# Patient Record
Sex: Male | Born: 1965 | Race: White | Hispanic: No | Marital: Married | Smoking: Current some day smoker
Health system: Southern US, Community
[De-identification: ages and names within clinical notes are randomized; demographics above are authoritative.]

## PROBLEM LIST (undated history)

## (undated) DIAGNOSIS — Z9889 Other specified postprocedural states: Secondary | ICD-10-CM

## (undated) DIAGNOSIS — F439 Reaction to severe stress, unspecified: Principal | ICD-10-CM

## (undated) DIAGNOSIS — Z86711 Personal history of pulmonary embolism: Secondary | ICD-10-CM

## (undated) DIAGNOSIS — F329 Major depressive disorder, single episode, unspecified: Secondary | ICD-10-CM

## (undated) DIAGNOSIS — T7840XA Allergy, unspecified, initial encounter: Secondary | ICD-10-CM

## (undated) DIAGNOSIS — T8859XA Other complications of anesthesia, initial encounter: Secondary | ICD-10-CM

## (undated) DIAGNOSIS — F17200 Nicotine dependence, unspecified, uncomplicated: Secondary | ICD-10-CM

## (undated) DIAGNOSIS — M1990 Unspecified osteoarthritis, unspecified site: Secondary | ICD-10-CM

## (undated) DIAGNOSIS — M25872 Other specified joint disorders, left ankle and foot: Secondary | ICD-10-CM

## (undated) DIAGNOSIS — R112 Nausea with vomiting, unspecified: Secondary | ICD-10-CM

## (undated) DIAGNOSIS — T4145XA Adverse effect of unspecified anesthetic, initial encounter: Secondary | ICD-10-CM

## (undated) DIAGNOSIS — K219 Gastro-esophageal reflux disease without esophagitis: Secondary | ICD-10-CM

## (undated) DIAGNOSIS — F32A Depression, unspecified: Secondary | ICD-10-CM

## (undated) HISTORY — DX: Other specified joint disorders, left ankle and foot: M25.872

## (undated) HISTORY — PX: HAND SURGERY: SHX662

## (undated) HISTORY — DX: Nicotine dependence, unspecified, uncomplicated: F17.200

## (undated) HISTORY — PX: ANKLE ARTHROSCOPY: SUR85

## (undated) HISTORY — DX: Major depressive disorder, single episode, unspecified: F32.9

## (undated) HISTORY — DX: Reaction to severe stress, unspecified: F43.9

## (undated) HISTORY — PX: APPENDECTOMY: SHX54

## (undated) HISTORY — DX: Allergy, unspecified, initial encounter: T78.40XA

## (undated) HISTORY — DX: Depression, unspecified: F32.A

## (undated) HISTORY — DX: Personal history of pulmonary embolism: Z86.711

---
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
## 2006-05-13 ENCOUNTER — Ambulatory Visit: Payer: Self-pay | Admitting: Family Medicine

## 2006-06-27 ENCOUNTER — Ambulatory Visit: Payer: Self-pay | Admitting: Family Medicine

## 2006-07-23 ENCOUNTER — Encounter: Admission: RE | Admit: 2006-07-23 | Discharge: 2006-07-23 | Payer: Self-pay | Admitting: Family Medicine

## 2006-07-23 ENCOUNTER — Ambulatory Visit: Payer: Self-pay | Admitting: Family Medicine

## 2006-07-25 ENCOUNTER — Encounter: Admission: RE | Admit: 2006-07-25 | Discharge: 2006-07-25 | Payer: Self-pay | Admitting: Family Medicine

## 2006-07-31 ENCOUNTER — Ambulatory Visit: Payer: Self-pay | Admitting: Critical Care Medicine

## 2006-07-31 ENCOUNTER — Ambulatory Visit: Payer: Self-pay | Admitting: Family Medicine

## 2006-08-11 ENCOUNTER — Ambulatory Visit: Payer: Self-pay | Admitting: Critical Care Medicine

## 2006-08-11 ENCOUNTER — Ambulatory Visit: Admission: RE | Admit: 2006-08-11 | Discharge: 2006-08-11 | Payer: Self-pay | Admitting: Critical Care Medicine

## 2006-09-29 ENCOUNTER — Ambulatory Visit: Payer: Self-pay | Admitting: Family Medicine

## 2006-10-28 ENCOUNTER — Ambulatory Visit: Payer: Self-pay | Admitting: Critical Care Medicine

## 2007-05-19 ENCOUNTER — Ambulatory Visit: Payer: Self-pay | Admitting: Family Medicine

## 2007-09-04 ENCOUNTER — Ambulatory Visit: Payer: Self-pay | Admitting: Family Medicine

## 2007-12-02 DIAGNOSIS — F17200 Nicotine dependence, unspecified, uncomplicated: Secondary | ICD-10-CM

## 2007-12-02 HISTORY — DX: Nicotine dependence, unspecified, uncomplicated: F17.200

## 2008-01-06 ENCOUNTER — Ambulatory Visit: Payer: Self-pay | Admitting: Family Medicine

## 2008-03-09 ENCOUNTER — Ambulatory Visit: Payer: Self-pay | Admitting: Family Medicine

## 2008-08-01 ENCOUNTER — Ambulatory Visit: Payer: Self-pay | Admitting: Family Medicine

## 2008-09-02 DIAGNOSIS — I82409 Acute embolism and thrombosis of unspecified deep veins of unspecified lower extremity: Secondary | ICD-10-CM

## 2008-09-02 HISTORY — DX: Acute embolism and thrombosis of unspecified deep veins of unspecified lower extremity: I82.409

## 2008-10-26 ENCOUNTER — Ambulatory Visit: Payer: Self-pay | Admitting: Family Medicine

## 2008-12-30 ENCOUNTER — Ambulatory Visit: Payer: Self-pay | Admitting: Family Medicine

## 2009-03-14 ENCOUNTER — Ambulatory Visit: Payer: Self-pay | Admitting: Family Medicine

## 2009-07-21 ENCOUNTER — Ambulatory Visit: Payer: Self-pay | Admitting: Family Medicine

## 2010-06-27 ENCOUNTER — Ambulatory Visit: Payer: Self-pay | Admitting: Family Medicine

## 2010-09-23 ENCOUNTER — Encounter: Payer: Self-pay | Admitting: Family Medicine

## 2011-01-18 NOTE — Assessment & Plan Note (Signed)
[facility]                             PULMONARY OFFICE NOTE   NAME:Dwyer, PROMISE BUSHONG                       MRN:          [id number]  DATE:10/28/2006                            DOB:          [birth year]    Mr. Mirabal returns today in followup, and he has resolution of his  hemoptysis.  He has had no further cough since we saw him in November.  Bronchoscopy was performed, showed only bronchitis with no endobronchial  lesions and no evidence of malignancy.  He has now pursued smoking  cessation utilizing the Chantix product and is now fully off cigarettes  since January 29 of this year.   PHYSICAL EXAMINATION:  Temp 97.6, blood pressure 130/80, pulse 92,  saturation 98% on room air.  CHEST:  Showed to be completely clear without evidence of wheeze, rale  or rhonchi.  CARDIAC:  Exam showed a regular rate and rhythm without S3, normal S1,  S2.  ABDOMEN:  Soft, nontender.  EXTREMITIES:  Showed no edema or clubbing.  SKIN:  Clear.   IMPRESSION:  Stable and resolved acute bronchitis, smoking-related.   PLAN:  For the patient is to maintain off any systemic medications or  inhaled medications.  He has successfully completed smoking cessation.  We will see the patient back in return followup as needed.     Charlcie Cradle Delford Field, MD, [facility]  Electronically Signed    PEW/MedQ  DD: 10/28/2006  DT: 10/29/2006  Job #: [id number]   cc:   Sharlot Gowda, M.D.

## 2011-01-18 NOTE — Op Note (Signed)
NAME:  ISIAH, SCHEEL                ACCOUNT NO.:  [id number]   MEDICAL RECORD NO.:  [id number]          PATIENT TYPE:  AMB   LOCATION:  CARD                         FACILITY:  [facility]   PHYSICIAN:  Charlcie Cradle. Delford Field, MD, FCCPDATE OF BIRTH:  1966/03/09   DATE OF PROCEDURE:  08/11/2006  DATE OF DISCHARGE:                               OPERATIVE REPORT   PROCEDURE:  Bronchoscopy.   INDICATIONS:  Hemoptysis.   OPERATOR:  Jashua Knaak. Delford Field, MD, FCCP   ANESTHESIA:  1% Xylocaine local.   PREOPERATIVE MEDICATIONS:  Versed 4 mg, fentanyl 50 mcg IV push.   PROCEDURE:  The Pentax video bronchoscope was introduced via the right  nares.  The upper airways were visualized and were unremarkable.  The  entire tracheobronchial tree was visualized, and revealed diffuse  tracheobronchitis. However, there was no evidence of endobronchial  lesions.  No specimens were obtained.   COMPLICATIONS:  None.   IMPRESSION:  Tracheobronchitis, induced by smoking and welding fume  exposure.   RECOMMENDATIONS:  Pursue smoking cessation.      Charlcie Cradle Delford Field, MD, [facility]  Electronically Signed     PEW/MEDQ  D:  08/11/2006  T:  08/11/2006  Job:  [id number]

## 2011-01-18 NOTE — Assessment & Plan Note (Signed)
[facility]                             PULMONARY OFFICE NOTE   NAME:Rule, Gerald Fernandez                       MRN:          [id number]  DATE:07/31/2006                            DOB:          [birth year]    Thursday, July 31, 2006   CHIEF COMPLAINT:  Hemoptysis.   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  He has not had hemoptysis before or since.  He has had slight post nasal  drainage, slight amount of chest discomfort on the right anterior chest  wall area.  He denies any previous chronic cough, previous pneumonia.  He does smoke a pack a day of cigarettes for at least 20 years.  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  He is short of breath  with activity, not at rest.  He has no real cough.  Between these  episodes he has some chest discomfort as noted, mild acid heartburn and  indigestion, some headaches, no nasal congestion, no sneezing or itching  or other respiratory complaints.  He is referred for evaluation of  hemoptysis.   PAST MEDICAL HISTORY:  Essentially noncontributory.  No surgeries, no  other major medical problems.   ALLERGIES:  PENICILLIN.   CURRENT MEDICATION:  Multivitamin only.   SOCIAL HISTORY:  Patient is married, has children, smokes as above,  works as a Education officer, environmental.   FAMILY HISTORY:  Emphysema in mother, heart failure in grandmother.   REVIEW OF SYSTEMS:  Otherwise, noncontributory.   PHYSICAL EXAMINATION:  VITAL SIGNS:  Weight 185 pounds, temperature 98,  blood pressure 130/86, pulse 69, saturation 97% on room air.  HEENT:  No jugular venous distension, no lymphadenopathy.  Oropharynx  clear.  NECK:  Supple.  CHEST:  Completely clear to auscultation and percussion.  There was no  evidence of any adventitious breath  sounds, no wheezing, rhonchi or  rales noted.  CARDIOVASCULAR:  Regular rate and rhythm without S3, normal S1 and S2.  ABDOMEN:  Soft, nontender.  EXTREMITIES:  No edema, clubbing or venous disease.  SKIN:  Clear.  NEUROLOGIC:  Intact.   LABORATORY DATA:  CT scan of the chest and chest x-rays were obtained  and reviewed and revealed no active process, no fibrosis, mediastinal  nodes, masses or other abnormalities were seen.  Spirometry was obtained  and showed normal spirometry with an FEV1 of 109% predicted, FVC of 107%  predicted.   IMPRESSION:  Hemoptysis likely due to bronchitis with active smoking use  and welding fume exposure.   RECOMMENDATIONS:  Pursue smoking cessation.  The patient is going to use  over-the-counter Nicoderm for this or nicotine chewing gum.  Patient  will pursue bronchoscopy, this has been scheduled for August 05, 2006.  Once the results of this are available, further recommendations will  follow.     Charlcie Cradle Delford Field, MD, [facility]  Electronically Signed  PEW/MedQ  DD: 07/31/2006  DT: 08/01/2006  Job #: [id number]

## 2011-03-05 ENCOUNTER — Encounter: Payer: Self-pay | Admitting: Medical

## 2011-03-05 ENCOUNTER — Ambulatory Visit (INDEPENDENT_AMBULATORY_CARE_PROVIDER_SITE_OTHER): Payer: Managed Care, Other (non HMO) | Admitting: Medical

## 2011-03-05 VITALS — BP 120/84 | HR 85 | Temp 98.0°F | Ht 70.0 in | Wt 177.0 lb

## 2011-03-05 DIAGNOSIS — J329 Chronic sinusitis, unspecified: Secondary | ICD-10-CM

## 2011-03-05 DIAGNOSIS — F17200 Nicotine dependence, unspecified, uncomplicated: Secondary | ICD-10-CM

## 2011-03-05 DIAGNOSIS — J40 Bronchitis, not specified as acute or chronic: Secondary | ICD-10-CM

## 2011-03-05 MED ORDER — MOXIFLOXACIN HCL 400 MG PO TABS: 400.0000 mg | ORAL_TABLET | Freq: Every day | ORAL | Status: AC

## 2011-03-05 MED ORDER — VARENICLINE TARTRATE 0.5 MG PO TABS: 0.5000 mg | ORAL_TABLET | Freq: Two times a day (BID) | ORAL | Status: AC

## 2011-03-05 NOTE — Patient Instructions (Addendum)
Continue Neti Pot, add Mucinex DM OTC, drink plenty of water, begin Avelox.  Consider Zyrtec daily.  Restart Chantix and call 1-800-QUIT-NOW for counseling to help stop smoking.

## 2011-03-05 NOTE — Progress Notes (Signed)
Subjective:     Gerald Fernandez is a 45 y.o. male who presents for c/o 3wk hx/o cold symptoms.  Thought it was just seasonal allergies, but can't seem to shake it.  Blowing nose and getting lime green infection and mucous.  For the first week it was all in sinuses. Then it moved to his chest, but now seems to be going back and forth from sinus to chest.  Ears popping and pressure like fluid in the ears.   Generally doesn't not feel well. Coughing a lot, and he is a smoker.  Using cough and cold medication OTC and Neti pot for symptoms.  Denies sick contacts.  No other aggravating or relieving factors.  No other c/o.  He is also interested in restarting Chantix.  He was able to quit for 1.5 year in the past on Chantix, but after 1 presentation he had to give at work, he had smoke a cigarette to calm his nerves, and he has been back to smoking since.   The following portions of the patient's history were reviewed and updated as appropriate: allergies, current medications, past family history, past medical history, past social history, past surgical history and problem list.  Past Medical History  Diagnosis Date  . Allergy   . Tobacco use disorder    Review of Systems Constitutional: denies fever, chills, sweats, anorexia Skin: denies rash HEENT: denies sore throat, itchy watery eyes Cardiovascular: denies chest pain Lungs: +cough, wheezing Abdomen: denies abdominal pain, nausea, vomiting, diarrhea GU: denies dysuria  Objective:   Filed Vitals:   03/05/11 1400  BP: 120/84  Pulse: 85  Temp: 98 F (36.7 C)    General appearance: Alert, WD/WN, no distress                             Skin: warm, no rash                           Head: + mild sinus tenderness,                            Eyes: conjunctiva normal, corneas clear, PERRLA                            Ears: TMs with erythema and retracted, external ear canals normal                          Nose: septum midline, turbinates  swollen, with erythema and clear discharge             Mouth/throat: MMM, tongue normal, mild pharyngeal erythema                           Neck: supple, no adenopathy, no thyromegaly, non tender                          Heart: RRR, normal S1, S2, no murmurs                         Lungs: decreased breath sounds, no wheezes, rales, or rhonchi      Assessment:   Encounter Diagnoses  Name Primary?  . Sinusitis Yes  . Bronchitis   .  Tobacco use disorder      Plan:   Sinusitis and bronchitis - Prescription given for Avelox.  Can use OTC Mucinex DM for congestion.  Tylenol or Ibuprofen OTC for fever and malaise.  Discussed symptomatic relief, nasal saline, and call or return if worse or not improving in 2-3 days.     Tobacco use - restart Chantix, and call 1-800-QUIT-NOW for counseling.  He had quit for 1.5 year on Chantix prior.  Recheck in 60mo.

## 2011-08-29 ENCOUNTER — Encounter: Payer: Self-pay | Admitting: Internal Medicine

## 2011-08-30 ENCOUNTER — Ambulatory Visit: Payer: Managed Care, Other (non HMO) | Admitting: Medical

## 2011-12-04 ENCOUNTER — Ambulatory Visit (INDEPENDENT_AMBULATORY_CARE_PROVIDER_SITE_OTHER): Payer: Managed Care, Other (non HMO) | Admitting: Family Medicine

## 2011-12-04 ENCOUNTER — Encounter: Payer: Self-pay | Admitting: Family Medicine

## 2011-12-04 VITALS — BP 130/80 | HR 78 | Wt 188.0 lb

## 2011-12-04 DIAGNOSIS — M25519 Pain in unspecified shoulder: Secondary | ICD-10-CM

## 2011-12-04 DIAGNOSIS — M25512 Pain in left shoulder: Secondary | ICD-10-CM

## 2011-12-04 NOTE — Progress Notes (Signed)
  Subjective:    Patient ID: Gerald Fernandez, male    DOB: 03-08-1966, 46 y.o.   MRN: [id number]  HPI He complains of a six-month history of left scapular pain. No history of injury to that area. He does complain of radiation of the pain down into his arm with motion of the shoulder as well as a cracking sensation.   Review of Systems     Objective:   Physical Exam Full motion of the shoulder without pain. No tenderness to palpation of the shoulder. Neer's and Hawkins tests were Motion of the left scapula does cause a clicking sensation in the scapular area.       Assessment & Plan:  Left scapular pain, etiology unclear. Left scapular x-rays will be ordered and if negative, referral to physical therapy. He is also to take ibuprofen 800 mg 3 times a day.

## 2011-12-04 NOTE — Patient Instructions (Signed)
Take 4 ibuprofen 3 times per day. If the x-ray is negative, I will refer you to physical therapy.

## 2012-06-25 ENCOUNTER — Telehealth: Payer: Self-pay | Admitting: Medical

## 2012-06-25 NOTE — Telephone Encounter (Signed)
He needs an appointment so we can discuss this

## 2012-06-25 NOTE — Telephone Encounter (Signed)
Called pt cell left message he needs appt to discuss chantex refill

## 2012-09-02 DIAGNOSIS — Z86711 Personal history of pulmonary embolism: Secondary | ICD-10-CM

## 2012-09-02 HISTORY — DX: Personal history of pulmonary embolism: Z86.711

## 2012-12-10 ENCOUNTER — Encounter: Payer: Self-pay | Admitting: Family Medicine

## 2012-12-10 ENCOUNTER — Ambulatory Visit (INDEPENDENT_AMBULATORY_CARE_PROVIDER_SITE_OTHER): Payer: Managed Care, Other (non HMO) | Admitting: Family Medicine

## 2012-12-10 VITALS — BP 120/78 | HR 82 | Wt 182.0 lb

## 2012-12-10 DIAGNOSIS — Z7189 Other specified counseling: Secondary | ICD-10-CM

## 2012-12-10 DIAGNOSIS — F17200 Nicotine dependence, unspecified, uncomplicated: Secondary | ICD-10-CM

## 2012-12-10 DIAGNOSIS — Z719 Counseling, unspecified: Secondary | ICD-10-CM

## 2012-12-10 NOTE — Progress Notes (Signed)
  Subjective:    Patient ID: Gerald Fernandez, male    DOB: 1966/04/02, 47 y.o.   MRN: [id number]  HPI He was originally scheduled to discuss smoking cessation as well as difficulty with his foot. He proceeded to tell me that his aunt who he was quite close to died recently of hypertrophic cardiomyopathy. This has him quite distraught. He had to tell his mother of her death. This has made him think about his life and he is not interested in quitting smoking.   Review of Systems     Objective:   Physical Exam Alert and in no distress and tearful at times when discussing the death.       Assessment & Plan:  Current smoker  Bereavement counseling The majority of the time was spent discussing bereavement and helping him deal with his feelings towards her death. We discussed anger, denial, bargaining, depression and finally acceptance. I have encouraged him to call hospice and get involved in bereavement. I also recommended that he take his mother with him. We then discussed smoking cessation. He is interested in Chantix. Information was given to him concerning this. Recommend that he and his wife return and I would discuss smoking cessation with both of them. Discussed making it a contest between them. He will return here in one week. Over 25 minutes spent discussing all these issues with him.

## 2012-12-10 NOTE — Patient Instructions (Signed)
Call Hospice [phone].  Bereavement counseling. Take your mother. Call [phone] Quit Now

## 2012-12-16 ENCOUNTER — Ambulatory Visit (INDEPENDENT_AMBULATORY_CARE_PROVIDER_SITE_OTHER): Payer: Managed Care, Other (non HMO) | Admitting: Family Medicine

## 2012-12-16 ENCOUNTER — Encounter: Payer: Self-pay | Admitting: Family Medicine

## 2012-12-16 VITALS — BP 124/80 | HR 76 | Wt 182.0 lb

## 2012-12-16 DIAGNOSIS — Z716 Tobacco abuse counseling: Secondary | ICD-10-CM

## 2012-12-16 DIAGNOSIS — F17200 Nicotine dependence, unspecified, uncomplicated: Secondary | ICD-10-CM

## 2012-12-16 DIAGNOSIS — Z7189 Other specified counseling: Secondary | ICD-10-CM

## 2012-12-16 NOTE — Patient Instructions (Signed)
Smoking Cessation Quitting smoking is important to your health and has many advantages. However, it is not always easy to quit since nicotine is a very addictive drug. Often times, people try 3 times or more before being able to quit. This document explains the best ways for you to prepare to quit smoking. Quitting takes hard work and a lot of effort, but you can do it. ADVANTAGES OF QUITTING SMOKING  You will live longer, feel better, and live better.  Your body will feel the impact of quitting smoking almost immediately.  Within 20 minutes, blood pressure decreases. Your pulse returns to its normal level.  After 8 hours, carbon monoxide levels in the blood return to normal. Your oxygen level increases.  After 24 hours, the chance of having a heart attack starts to decrease. Your breath, hair, and body stop smelling like smoke.  After 48 hours, damaged nerve endings begin to recover. Your sense of taste and smell improve.  After 72 hours, the body is virtually free of nicotine. Your bronchial tubes relax and breathing becomes easier.  After 2 to 12 weeks, lungs can hold more air. Exercise becomes easier and circulation improves.  The risk of having a heart attack, stroke, cancer, or lung disease is greatly reduced.  After 1 year, the risk of coronary heart disease is cut in half.  After 5 years, the risk of stroke falls to the same as a nonsmoker.  After 10 years, the risk of lung cancer is cut in half and the risk of other cancers decreases significantly.  After 15 years, the risk of coronary heart disease drops, usually to the level of a nonsmoker.  If you are pregnant, quitting smoking will improve your chances of having a healthy baby.  The people you live with, especially any children, will be healthier.  You will have extra money to spend on things other than cigarettes. QUESTIONS TO THINK ABOUT BEFORE ATTEMPTING TO QUIT You may want to talk about your answers with your  caregiver.  Why do you want to quit?  If you tried to quit in the past, what helped and what did not?  What will be the most difficult situations for you after you quit? How will you plan to handle them?  Who can help you through the tough times? Your family? Friends? A caregiver?  What pleasures do you get from smoking? What ways can you still get pleasure if you quit? Here are some questions to ask your caregiver:  How can you help me to be successful at quitting?  What medicine do you think would be best for me and how should I take it?  What should I do if I need more help?  What is smoking withdrawal like? How can I get information on withdrawal? GET READY  Set a quit date.  Change your environment by getting rid of all cigarettes, ashtrays, matches, and lighters in your home, car, or work. Do not let people smoke in your home.  Review your past attempts to quit. Think about what worked and what did not. GET SUPPORT AND ENCOURAGEMENT You have a better chance of being successful if you have help. You can get support in many ways.  Tell your family, friends, and co-workers that you are going to quit and need their support. Ask them not to smoke around you.  Get individual, group, or telephone counseling and support. Programs are available at local hospitals and health centers. Call your local health department for   information about programs in your area.  Spiritual beliefs and practices may help some smokers quit.  Download a "quit meter" on your computer to keep track of quit statistics, such as how long you have gone without smoking, cigarettes not smoked, and money saved.  Get a self-help book about quitting smoking and staying off of tobacco. LEARN NEW SKILLS AND BEHAVIORS  Distract yourself from urges to smoke. Talk to someone, go for a walk, or occupy your time with a task.  Change your normal routine. Take a different route to work. Drink tea instead of coffee.  Eat breakfast in a different place.  Reduce your stress. Take a hot bath, exercise, or read a book.  Plan something enjoyable to do every day. Reward yourself for not smoking.  Explore interactive web-based programs that specialize in helping you quit. GET MEDICINE AND USE IT CORRECTLY Medicines can help you stop smoking and decrease the urge to smoke. Combining medicine with the above behavioral methods and support can greatly increase your chances of successfully quitting smoking.  Nicotine replacement therapy helps deliver nicotine to your body without the negative effects and risks of smoking. Nicotine replacement therapy includes nicotine gum, lozenges, inhalers, nasal sprays, and skin patches. Some may be available over-the-counter and others require a prescription.  Antidepressant medicine helps people abstain from smoking, but how this works is unknown. This medicine is available by prescription.  Nicotinic receptor partial agonist medicine simulates the effect of nicotine in your brain. This medicine is available by prescription. Ask your caregiver for advice about which medicines to use and how to use them based on your health history. Your caregiver will tell you what side effects to look out for if you choose to be on a medicine or therapy. Carefully read the information on the package. Do not use any other product containing nicotine while using a nicotine replacement product.  RELAPSE OR DIFFICULT SITUATIONS Most relapses occur within the first 3 months after quitting. Do not be discouraged if you start smoking again. Remember, most people try several times before finally quitting. You may have symptoms of withdrawal because your body is used to nicotine. You may crave cigarettes, be irritable, feel very hungry, cough often, get headaches, or have difficulty concentrating. The withdrawal symptoms are only temporary. They are strongest when you first quit, but they will go away within  10 14 days. To reduce the chances of relapse, try to:  Avoid drinking alcohol. Drinking lowers your chances of successfully quitting.  Reduce the amount of caffeine you consume. Once you quit smoking, the amount of caffeine in your body increases and can give you symptoms, such as a rapid heartbeat, sweating, and anxiety.  Avoid smokers because they can make you want to smoke.  Do not let weight gain distract you. Many smokers will gain weight when they quit, usually less than 10 pounds. Eat a healthy diet and stay active. You can always lose the weight gained after you quit.  Find ways to improve your mood other than smoking. FOR MORE INFORMATION  www.smokefree.gov  Document Released: 08/13/2001 Document Revised: 02/18/2012 Document Reviewed: 11/28/2011 [employer] Patient Information 2013 [employer].  

## 2012-12-16 NOTE — Progress Notes (Signed)
  Subjective:    Patient ID: Gerald Fernandez, male    DOB: Sep 06, 1965, 47 y.o.   MRN: [id number]  HPI He is here with his wife to discuss smoking cessation. They have talked about this. They've even talked about having a contest concerning this. Upon further questioning he admits to not being ready.   Review of Systems     Objective:   Physical Exam Alert and in no distress otherwise not examined       Assessment & Plan:  Current smoker  Encounter for smoking cessation counseling  approximately one half hour spent discussing smoking cessation in regard to habit versus true addiction.discussed fully evaluating his smoking habits including when where and why it, making a list of things to do instead of smoking. Discussed: 800 number. We also discussed the use of Chantix. We also reviewed making a contest on this between him and his wife. He is to let me know when he is ready to actually start smoking program.

## 2013-02-10 ENCOUNTER — Ambulatory Visit (INDEPENDENT_AMBULATORY_CARE_PROVIDER_SITE_OTHER): Payer: Managed Care, Other (non HMO) | Admitting: Family Medicine

## 2013-02-10 ENCOUNTER — Encounter: Payer: Self-pay | Admitting: Family Medicine

## 2013-02-10 VITALS — BP 120/80 | HR 86 | Temp 97.5°F | Wt 174.0 lb

## 2013-02-10 DIAGNOSIS — M129 Arthropathy, unspecified: Secondary | ICD-10-CM

## 2013-02-10 DIAGNOSIS — R1032 Left lower quadrant pain: Secondary | ICD-10-CM

## 2013-02-10 DIAGNOSIS — M1990 Unspecified osteoarthritis, unspecified site: Secondary | ICD-10-CM

## 2013-02-10 NOTE — Progress Notes (Signed)
  Subjective:    Patient ID: OCEAN SCHILDT, male    DOB: 17-Jun-1966, 47 y.o.   MRN: [id number]  HPI 2 years ago he injured his left ankle and states that it swelled up and got discolored. He took care of himself. He is still having difficulty with it in that he has to wear a brace at work. It also hurts when he runs . 5 days ago he woke up with abdominal distress and bloating sensation. Later that evening he noted some left-sided abdominal pain with nausea but no trouble with diarrhea. The pain has been slowly getting better but still feeling left lower cautery pain no nausea, vomiting, urinary symptoms   Review of Systems     Objective:   Physical Exam Alert and in no distress. Abdominal exam shows active bowel sounds with some mid quadrant tenderness and questionable rebound. Genital exam normal. Rectal exam shows a normal prostate with guaiac-negative stool. Ankle x-ray does show arthritic changes present.       Assessment & Plan:  Arthritis  Abdominal pain, left lower quadrant recommend using Tylenol and then anti-inflammatory choice for his ankle. He can also use the brace as needed. Recommend he get involved in nonrunning type activities he keep himself in shape specifically cycling or swimming. Also discussed the abdominal pain and since he is getting better, no intervention necessary. Mentioned the possibility of diverticulitis. If he has reoccurrence of this, he is to call me and we will pursue this more aggressively.

## 2013-02-10 NOTE — Patient Instructions (Signed)
Since you're getting better, we will leave you alone but if this cranks up again I want to know about it quickly. Try Tylenol first for your pain and use the brace as needed and I would try physical activities that you don't have to use her ankle. Cycling swimming

## 2013-04-03 ENCOUNTER — Encounter (HOSPITAL_COMMUNITY): Payer: Self-pay | Admitting: *Deleted

## 2013-04-03 ENCOUNTER — Inpatient Hospital Stay (HOSPITAL_COMMUNITY)
Admission: EM | Admit: 2013-04-03 | Discharge: 2013-04-06 | DRG: 176 | Disposition: A | Discharge status: Home / Self Care | Payer: 59 | Attending: Internal Medicine | Admitting: Internal Medicine

## 2013-04-03 ENCOUNTER — Emergency Department (HOSPITAL_COMMUNITY): Payer: 59

## 2013-04-03 DIAGNOSIS — Z86711 Personal history of pulmonary embolism: Secondary | ICD-10-CM | POA: Diagnosis present

## 2013-04-03 DIAGNOSIS — R1011 Right upper quadrant pain: Secondary | ICD-10-CM

## 2013-04-03 DIAGNOSIS — Z720 Tobacco use: Secondary | ICD-10-CM

## 2013-04-03 DIAGNOSIS — D72829 Elevated white blood cell count, unspecified: Secondary | ICD-10-CM | POA: Diagnosis present

## 2013-04-03 DIAGNOSIS — I2699 Other pulmonary embolism without acute cor pulmonale: Principal | ICD-10-CM

## 2013-04-03 DIAGNOSIS — Z880 Allergy status to penicillin: Secondary | ICD-10-CM

## 2013-04-03 DIAGNOSIS — R110 Nausea: Secondary | ICD-10-CM | POA: Diagnosis present

## 2013-04-03 DIAGNOSIS — R0781 Pleurodynia: Secondary | ICD-10-CM | POA: Diagnosis present

## 2013-04-03 DIAGNOSIS — F17200 Nicotine dependence, unspecified, uncomplicated: Secondary | ICD-10-CM | POA: Diagnosis present

## 2013-04-03 LAB — CBC
Hemoglobin: 15.3 g/dL (ref 13.0–17.0)
Platelets: 188 10*3/uL (ref 150–400)
RBC: 4.97 MIL/uL (ref 4.22–5.81)
WBC: 13.9 10*3/uL — ABNORMAL HIGH (ref 4.0–10.5)

## 2013-04-03 LAB — POCT I-STAT TROPONIN I: Troponin i, poc: 0 ng/mL (ref 0.00–0.08)

## 2013-04-03 NOTE — ED Notes (Signed)
Pt states that he was helping in kitchen experiencing right sided CP that radiates to right shoulder and back, SOB. Tender to palpation. Pain exacerbated by breathing, Pain relived by  Nothing.

## 2013-04-04 ENCOUNTER — Emergency Department (HOSPITAL_COMMUNITY): Payer: 59

## 2013-04-04 ENCOUNTER — Encounter (HOSPITAL_COMMUNITY): Payer: Self-pay | Admitting: Radiology

## 2013-04-04 DIAGNOSIS — I2699 Other pulmonary embolism without acute cor pulmonale: Principal | ICD-10-CM

## 2013-04-04 DIAGNOSIS — F17200 Nicotine dependence, unspecified, uncomplicated: Secondary | ICD-10-CM | POA: Diagnosis present

## 2013-04-04 DIAGNOSIS — R0781 Pleurodynia: Secondary | ICD-10-CM | POA: Diagnosis present

## 2013-04-04 DIAGNOSIS — R071 Chest pain on breathing: Secondary | ICD-10-CM

## 2013-04-04 DIAGNOSIS — Z86711 Personal history of pulmonary embolism: Secondary | ICD-10-CM | POA: Diagnosis present

## 2013-04-04 LAB — PRO B NATRIURETIC PEPTIDE: Pro B Natriuretic peptide (BNP): 27.1 pg/mL (ref 0–125)

## 2013-04-04 LAB — BASIC METABOLIC PANEL
CO2: 27 mEq/L (ref 19–32)
Calcium: 9.3 mg/dL (ref 8.4–10.5)
Chloride: 101 mEq/L (ref 96–112)
Glucose, Bld: 110 mg/dL — ABNORMAL HIGH (ref 70–99)
Potassium: 3.8 mEq/L (ref 3.5–5.1)
Sodium: 137 mEq/L (ref 135–145)

## 2013-04-04 LAB — D-DIMER, QUANTITATIVE: D-Dimer, Quant: 1.03 ug/mL-FEU — ABNORMAL HIGH (ref 0.00–0.48)

## 2013-04-04 MED ORDER — OXYCODONE-ACETAMINOPHEN 5-325 MG PO TABS
2.0000 | ORAL_TABLET | ORAL | Status: DC | PRN
  Administered 2013-04-04 – 2013-04-06 (×8): 2 via ORAL
  Administered 2013-04-06: 1 via ORAL
  Filled 2013-04-04 (×3): qty 2
  Filled 2013-04-04: qty 1
  Filled 2013-04-04 (×5): qty 2

## 2013-04-04 MED ORDER — IOHEXOL 350 MG/ML SOLN
100.0000 mL | Freq: Once | INTRAVENOUS | Status: AC | PRN
  Administered 2013-04-04: 80 mL via INTRAVENOUS

## 2013-04-04 MED ORDER — ACETAMINOPHEN 325 MG PO TABS
650.0000 mg | ORAL_TABLET | Freq: Four times a day (QID) | ORAL | Status: DC | PRN
  Administered 2013-04-05: 650 mg via ORAL
  Filled 2013-04-04: qty 2

## 2013-04-04 MED ORDER — RIVAROXABAN 15 MG PO TABS
15.0000 mg | ORAL_TABLET | Freq: Two times a day (BID) | ORAL | Status: DC
  Administered 2013-04-04 – 2013-04-06 (×5): 15 mg via ORAL
  Filled 2013-04-04 (×10): qty 1

## 2013-04-04 MED ORDER — HYDROMORPHONE HCL PF 1 MG/ML IJ SOLN
1.0000 mg | Freq: Once | INTRAMUSCULAR | Status: AC
  Administered 2013-04-04: 1 mg via INTRAVENOUS
  Filled 2013-04-04: qty 1

## 2013-04-04 MED ORDER — ADULT MULTIVITAMIN W/MINERALS CH
1.0000 | ORAL_TABLET | Freq: Every day | ORAL | Status: DC
  Administered 2013-04-04 – 2013-04-06 (×3): 1 via ORAL
  Filled 2013-04-04 (×4): qty 1

## 2013-04-04 MED ORDER — NICOTINE 21 MG/24HR TD PT24
21.0000 mg | MEDICATED_PATCH | Freq: Every day | TRANSDERMAL | Status: DC
  Administered 2013-04-04 – 2013-04-06 (×3): 21 mg via TRANSDERMAL
  Filled 2013-04-04 (×3): qty 1

## 2013-04-04 MED ORDER — ZOLPIDEM TARTRATE 5 MG PO TABS: 5.0000 mg | ORAL_TABLET | Freq: Every evening | ORAL | Status: DC | PRN

## 2013-04-04 MED ORDER — SODIUM CHLORIDE 0.9 % IJ SOLN
3.0000 mL | Freq: Two times a day (BID) | INTRAMUSCULAR | Status: DC
  Administered 2013-04-04 – 2013-04-06 (×5): 3 mL via INTRAVENOUS

## 2013-04-04 MED ORDER — HYDROCODONE-ACETAMINOPHEN 5-325 MG PO TABS
1.0000 | ORAL_TABLET | ORAL | Status: DC | PRN
  Administered 2013-04-04 (×3): 2 via ORAL
  Filled 2013-04-04 (×3): qty 2

## 2013-04-04 MED ORDER — HYDROMORPHONE HCL PF 1 MG/ML IJ SOLN
1.0000 mg | INTRAMUSCULAR | Status: DC | PRN
  Administered 2013-04-04 – 2013-04-05 (×3): 1 mg via INTRAVENOUS
  Filled 2013-04-04 (×4): qty 1

## 2013-04-04 MED ORDER — ONDANSETRON HCL 4 MG/2ML IJ SOLN
4.0000 mg | Freq: Three times a day (TID) | INTRAMUSCULAR | Status: DC | PRN
  Administered 2013-04-04: 4 mg via INTRAVENOUS
  Filled 2013-04-04: qty 2

## 2013-04-04 MED ORDER — PANTOPRAZOLE SODIUM 40 MG PO TBEC
40.0000 mg | DELAYED_RELEASE_TABLET | Freq: Every day | ORAL | Status: DC
  Administered 2013-04-04 – 2013-04-05 (×2): 40 mg via ORAL
  Filled 2013-04-04 (×2): qty 1

## 2013-04-04 MED ORDER — ACETAMINOPHEN 650 MG RE SUPP: 650.0000 mg | Freq: Four times a day (QID) | RECTAL | Status: DC | PRN

## 2013-04-04 MED ORDER — ENOXAPARIN SODIUM 80 MG/0.8ML ~~LOC~~ SOLN
80.0000 mg | Freq: Once | SUBCUTANEOUS | Status: DC
  Filled 2013-04-04: qty 0.8

## 2013-04-04 MED ORDER — HYDROMORPHONE HCL PF 1 MG/ML IJ SOLN
1.0000 mg | INTRAMUSCULAR | Status: DC | PRN
  Administered 2013-04-04 (×3): 1 mg via INTRAVENOUS
  Filled 2013-04-04 (×3): qty 1

## 2013-04-04 MED ORDER — ONDANSETRON HCL 4 MG PO TABS
4.0000 mg | ORAL_TABLET | Freq: Four times a day (QID) | ORAL | Status: DC | PRN
  Administered 2013-04-05 – 2013-04-06 (×3): 4 mg via ORAL
  Filled 2013-04-04 (×3): qty 1

## 2013-04-04 MED ORDER — RIVAROXABAN 20 MG PO TABS: 20.0000 mg | ORAL_TABLET | Freq: Every day | ORAL | Status: DC

## 2013-04-04 MED ORDER — ONDANSETRON HCL 4 MG/2ML IJ SOLN: 4.0000 mg | Freq: Four times a day (QID) | INTRAMUSCULAR | Status: DC | PRN

## 2013-04-04 MED ORDER — SENNA 8.6 MG PO TABS
1.0000 | ORAL_TABLET | Freq: Two times a day (BID) | ORAL | Status: DC
  Administered 2013-04-04 – 2013-04-06 (×4): 8.6 mg via ORAL
  Filled 2013-04-04 (×7): qty 1

## 2013-04-04 MED ORDER — ONDANSETRON HCL 4 MG/2ML IJ SOLN
4.0000 mg | Freq: Once | INTRAMUSCULAR | Status: AC
  Administered 2013-04-04: 4 mg via INTRAVENOUS
  Filled 2013-04-04: qty 2

## 2013-04-04 NOTE — ED Provider Notes (Signed)
Medical screening examination/treatment/procedure(s) were performed by non-physician practitioner and as supervising physician I was immediately available for consultation/collaboration.  Mellisa Arshad L Skye Plamondon, MD 04/04/13 0831 

## 2013-04-04 NOTE — ED Provider Notes (Signed)
CSN: [id number]     Arrival date & time 04/03/13  2248 History     First MD Initiated Contact with Patient 04/04/13 0012     Chief Complaint  Patient presents with  . Chest Pain   (Consider location/radiation/quality/duration/timing/severity/associated sxs/prior Treatment) The history is provided by the patient and medical records.   Patient presents to the ED complaining of right lower chest/RUQ abdominal pain radiating around to his right shoulder blade since this evening. Pt was helping wife prepare dinner at onset.  Area is exquisitely TTP. Pain worse with breathing and lying flat, better with sitting upright.  Pain does not seem to be related to PO intake. Denies any associated nausea, vomiting, or diarrhea. No prior history of gallbladder disease. Patient has no significant cardiac history, no history of CAD or MI. Patient is a daily smoker. Recent 10 hour plane travel from [place], returned home 2 days ago.  No hx of DVT or PE.  Past Medical History  Diagnosis Date  . Allergy   . Tobacco use disorder   . Smoker 12/02/07  . Allergic rhinitis    No past surgical history on file. No family history on file. History  Substance Use Topics  . Smoking status: Current Every Day Smoker -- 1.50 packs/day    Types: Cigarettes  . Smokeless tobacco: Never Used  . Alcohol Use: No    Review of Systems  Cardiovascular: Positive for chest pain.  Gastrointestinal: Positive for abdominal pain.  All other systems reviewed and are negative.    Allergies  Penicillins  Home Medications   Current Outpatient Rx  Name  Route  Sig  Dispense  Refill  . DM-Phenylephrine-Acetaminophen (ALKA-SELTZER PLS SINUS & COUGH PO)   Oral   Take 1 tablet by mouth once.         Marland Kitchen ibuprofen (ADVIL,MOTRIN) 200 MG tablet   Oral   Take 600 mg by mouth every 6 (six) hours as needed for pain.          . Multiple Vitamin (MULTIVITAMIN) tablet   Oral   Take 1 tablet by mouth daily.           BP  110/74  Pulse 90  Temp(Src) 98.5 F (36.9 C) (Oral)  Resp 26  SpO2 96%  Physical Exam  Nursing note and vitals reviewed. Constitutional: He is oriented to person, place, and time. He appears well-developed and well-nourished.  HENT:  Head: Normocephalic and atraumatic.  Mouth/Throat: Oropharynx is clear and moist.  Eyes: Conjunctivae and EOM are normal. Pupils are equal, round, and reactive to light.  Neck: Normal range of motion.  Cardiovascular: Normal rate, regular rhythm and normal heart sounds.   Pulmonary/Chest: Effort normal and breath sounds normal.  Abdominal: Soft. Bowel sounds are normal. There is tenderness in the right upper quadrant. There is guarding and positive Murphy's sign. There is no CVA tenderness and no tenderness at McBurney's point.    Musculoskeletal: Normal range of motion.  Neurological: He is alert and oriented to person, place, and time.  Skin: Skin is warm and dry.  Psychiatric: He has a normal mood and affect.    ED Course   Procedures (including critical care time)  Labs Reviewed  CBC - Abnormal; Notable for the following:    WBC 13.9 (*)    All other components within normal limits  BASIC METABOLIC PANEL - Abnormal; Notable for the following:    Glucose, Bld 110 (*)    All other components within  normal limits  D-DIMER, QUANTITATIVE - Abnormal; Notable for the following:    D-Dimer, Quant 1.03 (*)    All other components within normal limits  PRO B NATRIURETIC PEPTIDE  POCT I-STAT TROPONIN I   Dg Chest 2 View  04/03/2013   *RADIOLOGY REPORT*  Clinical Data: Right-sided chest and flank pain.  CHEST - 2 VIEW  Comparison: 07/23/2006.  Findings: Mildly prominent markings at the bases, with possible mild atelectasis on the left where the diaphragm is slightly elevated compared to prior.  No effusion, consolidation, or pneumothorax.  Normal heart size.  IMPRESSION: No evidence of acute cardiopulmonary disease.   Original Report Authenticated By:  Tiburcio Pea   Ct Angio Chest Pe W/cm &/or Wo Cm  04/04/2013   *RADIOLOGY REPORT*  Clinical Data: , elevated D-dimer  CT ANGIOGRAPHY CHEST  Technique:  Multidetector CT imaging of the chest using the standard protocol during bolus administration of intravenous contrast. Multiplanar reconstructed images including MIPs were obtained and reviewed to evaluate the vascular anatomy.  Contrast: 80mL OMNIPAQUE IOHEXOL 350 MG/ML SOLN  Comparison: Prior radiograph performed on 04/03/2013  Findings: There are no pathologically enlarged mediastinal, hilar, or axillary lymph nodes.  The aorta and great vessels demonstrate normal contrast enhanced appearance.  The heart size is within normal limits.  There no pericardial effusion.  Multiple filling defects are seen within several segmental pulmonary arteries within the right lower lobe (series 4, image 62, 64, 66).  Additional filling defects are seen within several segmental branches supplying the medial lateral segments of the right middle lobe as well, also consistent with pulmonary emboli. No left-sided filling defects are seen.  Patchy linear opacities within the dependent portions of both lower lobes most likely reflect atelectasis.  There is no pulmonary edema.  No pleural effusion.  There is no pneumothorax.  The visualized portions of the upper abdomen are unremarkable.  No acute osseous abnormalities are identified.  IMPRESSION: Multiple filling defects within the segmental branches of the right lower and right middle lobes as above, consistent with acute pulmonary emboli.  Critical Value/emergent results were called by telephone at the time of interpretation on 04/04/13 at 04:17 a.m. to Dr. Benjiman Core, who verbally acknowledged these results.   Original Report Authenticated By: Rise Mu, M.D.   US Abdomen Complete  04/04/2013   *RADIOLOGY REPORT*  Clinical Data:  Chest and right upper quadrant pain.  COMPLETE ABDOMINAL ULTRASOUND  Comparison:   None.  Findings:  Gallbladder:  The gallbladder is contracted.  There are 3 or 4 peripheral echogenic foci which appear non mobile, measuring 3 to 4 mm.  No shadowing or ring down artifact.  The gallbladder wall is thick in the setting of contraction.  There is focal tenderness over the gallbladder.  Common bile duct:  Measures 2 mm.  Liver:  No focal lesion identified.  Within normal limits in parenchymal echogenicity.  IVC:  Unremarkable by gray scale.  Pancreas:  Imaged portions unremarkable.  Spleen:  Measures 5 cm.  Right Kidney:  Measures 13 cm. No hydronephrosis or focal abnormality.  Left Kidney:  Measures 13 cm. No hydronephrosis or focal abnormality.  Abdominal aorta:  No aneurysm identified.  IMPRESSION:  1.  Although the patient is tender over the gallbladder, there is no gallbladder distension to suggest cystic duct obstruction. 2.  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   Original Report Authenticated By: Tiburcio Pea   1. Pulmonary embolism     MDM   Labs and imaging as above.  Multiple PEs in right middle and right lower lobes. Lovenox started in ED.  Some episodes of hypoxia, placed on 3L via [place] with improvement of sats to 97%.  Consult hospitalist, Dr. Betti Cruz, patient will be admitted for observation.  [facility] team 10.  Temp admit orders placed.  VS stable for transfer to floor.  Garlon Hatchet, PA-C 04/04/13 [phone]

## 2013-04-04 NOTE — ED Notes (Signed)
Pt placed on 3L 02, pt's 02 sats were reading 91%, put on 3L, pt's 02 sats now reading 97%.

## 2013-04-04 NOTE — ED Notes (Signed)
Admitting doctor at bedside 

## 2013-04-04 NOTE — ED Notes (Signed)
PT transported back from [place] by this RN

## 2013-04-04 NOTE — H&P (Signed)
Patient's PCP: Carollee Herter, MD  Chief Complaint: Right rib pain  History of Present Illness: Gerald Fernandez is a 47 y.o. Caucasian male with no significant past medical history except for tobacco use who presents with the above complaints.  Patient recently traveled to [place] with wife by car on 03/24/2013 and returned back to 03/29/2013 for at least 10-13 hours each way.  After coming back [place], yesterday around 230 p.m. he noted right rib pain.  Initially he thought it was due to gas and he took some Advil and Alka-Seltzer without any relief.  As the pain continued to worsen and especially when taking shallow breath he presented to the emergency department for further evaluation.  Imaging showed pulmonary embolism in the right lower and middle lobes, as a result the hospitalist service was asked to admit the patient for further care and management.  He denies any recent fevers, chills, denies any chest pain other than the right rib pain, denies any diarrhea, headaches or vision changes.  He does admit to feeling nauseated from the pain.  Has not vomited.  Review of Systems: All systems reviewed with the patient and positive as per history of present illness, otherwise all other systems are negative.  Past Medical History  Diagnosis Date  . Allergy   . Tobacco use disorder   . Smoker 12/02/07  . Allergic rhinitis    History reviewed. No pertinent past surgical history. Family History  Problem Relation Age of Onset  . COPD Mother   . Hypertension Father    History   Social History  . Marital Status: Married    Spouse Name: N/A    Number of Children: N/A  . Years of Education: N/A   Occupational History  . Not on file.   Social History Main Topics  . Smoking status: Current Every Day Smoker -- 1.50 packs/day    Types: Cigarettes  . Smokeless tobacco: Never Used  . Alcohol Use: No  . Drug Use: No  . Sexually Active: Not on file   Other Topics Concern  . Not  on file   Social History Narrative  . No narrative on file   Allergies: Penicillins  Home Meds: Prior to Admission medications   Medication Sig Start Date End Date Taking? Authorizing Provider  DM-Phenylephrine-Acetaminophen (ALKA-SELTZER PLS SINUS & COUGH PO) Take 1 tablet by mouth once.   Yes Historical Provider, MD  ibuprofen (ADVIL,MOTRIN) 200 MG tablet Take 600 mg by mouth every 6 (six) hours as needed for pain.    Yes Historical Provider, MD  Multiple Vitamin (MULTIVITAMIN) tablet Take 1 tablet by mouth daily.    Yes Historical Provider, MD    Physical Exam: Blood pressure 106/64, pulse 64, temperature 98.5 F (36.9 C), temperature source Oral, resp. rate 20, SpO2 93.00%. General: Awake, Oriented x3, No acute distress. HEENT: EOMI, Moist mucous membranes Neck: Supple CV: S1 and S2 Lungs: Clear to ascultation bilaterally, pain with deep inspiration. Abdomen: Soft, Nontender, Nondistended, +bowel sounds. Ext: Good pulses. Trace edema. No clubbing or cyanosis noted. Neuro: Cranial Nerves II-XII grossly intact. Has 5/5 motor strength in upper and lower extremities.  Lab results:  Recent Labs  04/03/13 2255  NA 137  K 3.8  CL 101  CO2 27  GLUCOSE 110*  BUN 12  CREATININE 0.67  CALCIUM 9.3   No results found for this basename: AST, ALT, ALKPHOS, BILITOT, PROT, ALBUMIN,  in the last 72 hours No results found for this basename: LIPASE,  AMYLASE,  in the last 72 hours  Recent Labs  04/03/13 2255  WBC 13.9*  HGB 15.3  HCT 43.7  MCV 87.9  PLT 188   No results found for this basename: CKTOTAL, CKMB, CKMBINDEX, TROPONINI,  in the last 72 hours No components found with this basename: POCBNP,   Recent Labs  04/04/13 0227  DDIMER 1.03*   No results found for this basename: HGBA1C,  in the last 72 hours No results found for this basename: CHOL, HDL, LDLCALC, TRIG, CHOLHDL, LDLDIRECT,  in the last 72 hours No results found for this basename: TSH, T4TOTAL, FREET3,  T3FREE, THYROIDAB,  in the last 72 hours No results found for this basename: VITAMINB12, FOLATE, FERRITIN, TIBC, IRON, RETICCTPCT,  in the last 72 hours Imaging results:  Dg Chest 2 View  04/03/2013   *RADIOLOGY REPORT*  Clinical Data: Right-sided chest and flank pain.  CHEST - 2 VIEW  Comparison: 07/23/2006.  Findings: Mildly prominent markings at the bases, with possible mild atelectasis on the left where the diaphragm is slightly elevated compared to prior.  No effusion, consolidation, or pneumothorax.  Normal heart size.  IMPRESSION: No evidence of acute cardiopulmonary disease.   Original Report Authenticated By: Tiburcio Pea   Ct Angio Chest Pe W/cm &/or Wo Cm  04/04/2013   *RADIOLOGY REPORT*  Clinical Data: , elevated D-dimer  CT ANGIOGRAPHY CHEST  Technique:  Multidetector CT imaging of the chest using the standard protocol during bolus administration of intravenous contrast. Multiplanar reconstructed images including MIPs were obtained and reviewed to evaluate the vascular anatomy.  Contrast: 80mL OMNIPAQUE IOHEXOL 350 MG/ML SOLN  Comparison: Prior radiograph performed on 04/03/2013  Findings: There are no pathologically enlarged mediastinal, hilar, or axillary lymph nodes.  The aorta and great vessels demonstrate normal contrast enhanced appearance.  The heart size is within normal limits.  There no pericardial effusion.  Multiple filling defects are seen within several segmental pulmonary arteries within the right lower lobe (series 4, image 62, 64, 66).  Additional filling defects are seen within several segmental branches supplying the medial lateral segments of the right middle lobe as well, also consistent with pulmonary emboli. No left-sided filling defects are seen.  Patchy linear opacities within the dependent portions of both lower lobes most likely reflect atelectasis.  There is no pulmonary edema.  No pleural effusion.  There is no pneumothorax.  The visualized portions of the upper  abdomen are unremarkable.  No acute osseous abnormalities are identified.  IMPRESSION: Multiple filling defects within the segmental branches of the right lower and right middle lobes as above, consistent with acute pulmonary emboli.  Critical Value/emergent results were called by telephone at the time of interpretation on 04/04/13 at 04:17 a.m. to Dr. Benjiman Core, who verbally acknowledged these results.   Original Report Authenticated By: Rise Mu, M.D.   US Abdomen Complete  04/04/2013   *RADIOLOGY REPORT*  Clinical Data:  Chest and right upper quadrant pain.  COMPLETE ABDOMINAL ULTRASOUND  Comparison:  None.  Findings:  Gallbladder:  The gallbladder is contracted.  There are 3 or 4 peripheral echogenic foci which appear non mobile, measuring 3 to 4 mm.  No shadowing or ring down artifact.  The gallbladder wall is thick in the setting of contraction.  There is focal tenderness over the gallbladder.  Common bile duct:  Measures 2 mm.  Liver:  No focal lesion identified.  Within normal limits in parenchymal echogenicity.  IVC:  Unremarkable by gray scale.  Pancreas:  Imaged portions unremarkable.  Spleen:  Measures 5 cm.  Right Kidney:  Measures 13 cm. No hydronephrosis or focal abnormality.  Left Kidney:  Measures 13 cm. No hydronephrosis or focal abnormality.  Abdominal aorta:  No aneurysm identified.  IMPRESSION:  1.  Although the patient is tender over the gallbladder, there is no gallbladder distension to suggest cystic duct obstruction. 2.  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   Original Report Authenticated By: Tiburcio Pea   Other results: EKG: Normal sinus rhythm with heart rate of 93.  Assessment & Plan by Problem: Pulmonary embolism Admit the patient to telemetry.  Thromboembolic event triggered by recent travel.   Patient denies any family history of coagulopathy. Started on Rivaroxaban 15 mg twice daily for 21 days and 20 mg daily thereafter.  Will likely need to be  anticoagulated at least for 6 months.  Had the patient walk around the unit in the emergency department, maintained saturation above 91% on room air.  Offered the patient discharge from the ER, however after walking he became severely nauseated from the pain.  Admit for observation and pain control.    Pleuritic chest pain Likely due to pulmonary embolism.  Pain control with oral Vicodin as needed.  Also has when necessary Dilaudid for uncontrolled pain.  Tobacco abuse Counseled on cessation.  Nicotine patch prescribed.  Prophylaxis Rivaroxaban.  Disposition Admit the patient to telemetry as observation.  Time spent on admission, talking to the patient, and coordinating care was: 50 mins.  Khale Nigh A, MD 04/04/2013, 5:26 AM

## 2013-04-04 NOTE — ED Notes (Addendum)
PA informed pt requested pain medication

## 2013-04-04 NOTE — Progress Notes (Signed)
04/04/2013 1715 Sent benefits check for Xarelto for copay price. Isidoro Donning RN CCM Case Mgmt phone [phone]

## 2013-04-04 NOTE — ED Notes (Signed)
Received call from [place] stating pt in too much pain for transport.

## 2013-04-04 NOTE — Progress Notes (Signed)
Patient seen and examined. Has been admitted after midnight secondary to pleuritic CP and SOB. Was found with PE. Currently feeling better; but still with significant pain and nausea. Please referred to H&P by Dr. Betti Cruz for further details and info.  Plan: -continue xarelto -will continue supportive care with analgesics and antiemetics (primarily by mouth) -will wean as tolerated oxygen supplementation and check O2 needs at rest and on ambulation to determine if he will required short term use of home oxygen -continue nicotine -started on PPI -hopefully home in 1-2 days  Gerald Fernandez [phone]

## 2013-04-05 ENCOUNTER — Observation Stay (HOSPITAL_COMMUNITY): Payer: 59

## 2013-04-05 DIAGNOSIS — R1011 Right upper quadrant pain: Secondary | ICD-10-CM

## 2013-04-05 LAB — BASIC METABOLIC PANEL
CO2: 30 mEq/L (ref 19–32)
Calcium: 9.2 mg/dL (ref 8.4–10.5)
Chloride: 98 mEq/L (ref 96–112)
Sodium: 136 mEq/L (ref 135–145)

## 2013-04-05 LAB — CBC
MCH: 31.3 pg (ref 26.0–34.0)
Platelets: 176 10*3/uL (ref 150–400)
RBC: 4.6 MIL/uL (ref 4.22–5.81)
WBC: 13.9 10*3/uL — ABNORMAL HIGH (ref 4.0–10.5)

## 2013-04-05 LAB — HEPATIC FUNCTION PANEL
Albumin: 3.3 g/dL — ABNORMAL LOW (ref 3.5–5.2)
Alkaline Phosphatase: 41 U/L (ref 39–117)
Total Protein: 6.6 g/dL (ref 6.0–8.3)

## 2013-04-05 LAB — LIPASE, BLOOD: Lipase: 14 U/L (ref 11–59)

## 2013-04-05 MED ORDER — IBUPROFEN 400 MG PO TABS
400.0000 mg | ORAL_TABLET | Freq: Three times a day (TID) | ORAL | Status: DC
  Administered 2013-04-05 – 2013-04-06 (×3): 400 mg via ORAL
  Filled 2013-04-05 (×6): qty 1

## 2013-04-05 NOTE — Progress Notes (Signed)
[facility] PROGRESS NOTE  SHAARAV RIPPLE ZOX:[id number] DOB: 1966/06/03 DOA: 04/03/2013  PCP: Carollee Herter, MD  Brief HPI: Gerald Fernandez is a 47 y.o. Caucasian male with no significant past medical history except for tobacco use who presents with the above complaints. Patient recently traveled to [place] with wife by car on 03/24/2013 and returned back to 03/29/2013 for at least 10-13 hours each way. After coming back [place], he noted right rib pain. Initially he thought it was due to gas and he took some Advil and Alka-Seltzer without any relief. As the pain continued to worsen and especially when taking shallow breath he presented to the emergency department for further evaluation. Imaging showed pulmonary embolism in the right lower and middle lobes, as a result the hospitalist service was asked to admit the patient for further care and management. He was also experiencing severe nausea. No vomiting but has poor appetite.   Past medical history:  Past Medical History  Diagnosis Date  . Allergy   . Tobacco use disorder   . Smoker 12/02/07  . Allergic rhinitis     Consultants: None  Procedures: None  Antibiotics: None  Subjective: Patient has cough with bloody expectoration. Has sharp chest pain with deep breaths. Has upper abdominal pain with nausea.   Objective: Vital Signs  Filed Vitals:   04/04/13 2005 04/05/13 0411 04/05/13 1015 04/05/13 1021  BP: 128/73 117/75    Pulse: 76 76    Temp: 98 F (36.7 C) 98.3 F (36.8 C)    TempSrc: Axillary Oral    Resp:  20    Height:      Weight:  82.464 kg (181 lb 12.8 oz)    SpO2: 98% 96% 97% 95%    Intake/Output Summary (Last 24 hours) at 04/05/13 1137 Last data filed at 04/05/13 0600  Gross per 24 hour  Intake    360 ml  Output    575 ml  Net   -215 ml   Filed Weights   04/04/13 0611 04/05/13 0411  Weight: 80.5 kg (177 lb 7.5 oz) 82.464 kg (181 lb 12.8 oz)    General appearance: alert, cooperative,  appears stated age and no distress Head: Normocephalic, without obvious abnormality, atraumatic Resp: Decreased air entry at the bases. No crackles. Cardio: regular rate and rhythm, S1, S2 normal, no murmur, click, rub or gallop GI: Soft, tender in RUQ and epig. No rebound rigidity or guarding. Murphy's was equivocal. No masses or organomegaly. BS present Extremities: extremities normal, atraumatic, no cyanosis or edema Pulses: 2+ and symmetric Skin: Skin color, texture, turgor normal. No rashes or lesions Lymph nodes: Cervical, supraclavicular, and axillary nodes normal. Neurologic: Alert and oriented x 3. No focal deficits.  Lab Results:  Basic Metabolic Panel:  Recent Labs Lab 04/03/13 2255 04/05/13 0530  NA 137 136  K 3.8 3.5  CL 101 98  CO2 27 30  GLUCOSE 110* 108*  BUN 12 10  CREATININE 0.67 0.70  CALCIUM 9.3 9.2   Liver Function Tests: No results found for this basename: AST, ALT, ALKPHOS, BILITOT, PROT, ALBUMIN,  in the last 168 hours No results found for this basename: LIPASE, AMYLASE,  in the last 168 hours No results found for this basename: AMMONIA,  in the last 168 hours CBC:  Recent Labs Lab 04/03/13 2255 04/05/13 0530  WBC 13.9* 13.9*  HGB 15.3 14.4  HCT 43.7 41.1  MCV 87.9 89.3  PLT 188 176   BNP (last 3 results)  Recent Labs  04/03/13 2255  PROBNP 27.1    Studies/Results: Dg Chest 2 View  04/03/2013   *RADIOLOGY REPORT*  Clinical Data: Right-sided chest and flank pain.  CHEST - 2 VIEW  Comparison: 07/23/2006.  Findings: Mildly prominent markings at the bases, with possible mild atelectasis on the left where the diaphragm is slightly elevated compared to prior.  No effusion, consolidation, or pneumothorax.  Normal heart size.  IMPRESSION: No evidence of acute cardiopulmonary disease.   Original Report Authenticated By: Tiburcio Pea   Ct Angio Chest Pe W/cm &/or Wo Cm  04/04/2013   *RADIOLOGY REPORT*  Clinical Data: , elevated D-dimer  CT  ANGIOGRAPHY CHEST  Technique:  Multidetector CT imaging of the chest using the standard protocol during bolus administration of intravenous contrast. Multiplanar reconstructed images including MIPs were obtained and reviewed to evaluate the vascular anatomy.  Contrast: 80mL OMNIPAQUE IOHEXOL 350 MG/ML SOLN  Comparison: Prior radiograph performed on 04/03/2013  Findings: There are no pathologically enlarged mediastinal, hilar, or axillary lymph nodes.  The aorta and great vessels demonstrate normal contrast enhanced appearance.  The heart size is within normal limits.  There no pericardial effusion.  Multiple filling defects are seen within several segmental pulmonary arteries within the right lower lobe (series 4, image 62, 64, 66).  Additional filling defects are seen within several segmental branches supplying the medial lateral segments of the right middle lobe as well, also consistent with pulmonary emboli. No left-sided filling defects are seen.  Patchy linear opacities within the dependent portions of both lower lobes most likely reflect atelectasis.  There is no pulmonary edema.  No pleural effusion.  There is no pneumothorax.  The visualized portions of the upper abdomen are unremarkable.  No acute osseous abnormalities are identified.  IMPRESSION: Multiple filling defects within the segmental branches of the right lower and right middle lobes as above, consistent with acute pulmonary emboli.  Critical Value/emergent results were called by telephone at the time of interpretation on 04/04/13 at 04:17 a.m. to Dr. Benjiman Core, who verbally acknowledged these results.   Original Report Authenticated By: Rise Mu, M.D.   US Abdomen Complete  04/04/2013   *RADIOLOGY REPORT*  Clinical Data:  Chest and right upper quadrant pain.  COMPLETE ABDOMINAL ULTRASOUND  Comparison:  None.  Findings:  Gallbladder:  The gallbladder is contracted.  There are 3 or 4 peripheral echogenic foci which appear non  mobile, measuring 3 to 4 mm.  No shadowing or ring down artifact.  The gallbladder wall is thick in the setting of contraction.  There is focal tenderness over the gallbladder.  Common bile duct:  Measures 2 mm.  Liver:  No focal lesion identified.  Within normal limits in parenchymal echogenicity.  IVC:  Unremarkable by gray scale.  Pancreas:  Imaged portions unremarkable.  Spleen:  Measures 5 cm.  Right Kidney:  Measures 13 cm. No hydronephrosis or focal abnormality.  Left Kidney:  Measures 13 cm. No hydronephrosis or focal abnormality.  Abdominal aorta:  No aneurysm identified.  IMPRESSION:  1.  Although the patient is tender over the gallbladder, there is no gallbladder distension to suggest cystic duct obstruction. 2.  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   Original Report Authenticated By: Tiburcio Pea    Medications:  Scheduled: . multivitamin with minerals  1 tablet Oral Daily  . nicotine  21 mg Transdermal Daily  . pantoprazole  40 mg Oral Q1200  . rivaroxaban  15 mg Oral BID WC   Followed by  . [  START ON 04/25/2013] rivaroxaban  20 mg Oral Daily  . senna  1 tablet Oral BID  . sodium chloride  3 mL Intravenous Q12H   Continuous:  QMV:[id number], acetaminophen, HYDROcodone-acetaminophen, HYDROmorphone (DILAUDID) injection, ondansetron (ZOFRAN) IV, ondansetron, oxyCODONE-acetaminophen, zolpidem  Assessment/Plan:  Principal Problem:   Pulmonary embolism Active Problems:   Pleuritic chest pain   Tobacco abuse    Acute Right sided Pulmonary embolism Precipitated by recent road trip to [place] and back. On Rivaroxaban. Hemoptysis from PE. Patient reassured. Continue to monitor. Still requiring O2. Ambulate.   Nausea and Abdominal Pain US shows abnormal appearing GB. Unclear if pain is from PE or if he has symptoms due to his gallbladder. Discussed with radiologist. No stones, but polyps were noted. GB was contracted. Will order HIDA. Will check LFt and Lipase as well. PPI. May need  surgical input.  Tobacco abuse  Counseled on cessation. Nicotine patch prescribed.   Code Status:  Full code DVT Prophylaxis:   On Rivaroxaban Family Communication: Discussed with patient and his mother.  Disposition Plan: Not ready for discharge.    LOS: 2 days   [facility]  [facility] Pager [phone] 04/05/2013, 11:37 AM  If 8PM-8AM, please contact night-coverage at [email or web address], password [facility]

## 2013-04-05 NOTE — Progress Notes (Signed)
Pt able to tolerate room air while in bed resting; SPO2 dropped to 87% while ambulating to the bathroom without oxygen.  Will con't plan of care.

## 2013-04-05 NOTE — Care Management Note (Signed)
    Page 1 of 2   04/06/2013     4:36:03 PM   CARE MANAGEMENT NOTE 04/06/2013  Patient:  Gerald Fernandez, Gerald Fernandez   Account Number:  [id number]  Date Initiated:  04/04/2013  Documentation initiated by:  [facility]  Subjective/Objective Assessment:   pulmonary embolism     Action/Plan:   Anticipated DC Date:  04/06/2013   Anticipated DC Plan:  HOME W HOME HEALTH SERVICES      DC Planning Services  CM consult      Choice offered to / List presented to:     DME arranged  [facility]      DME agency  [facility]        Status of service:  Completed, signed off Medicare Important Message given?   (If response is "NO", the following Medicare IM given date fields will be blank) Date Medicare IM given:   Date Additional Medicare IM given:    Discharge Disposition:  HOME/SELF CARE  Per UR Regulation:  Reviewed for med. necessity/level of care/duration of stay  If discussed at Long Length of Stay Meetings, dates discussed:    Comments:  04/06/13 Khrista Braun,RN,BSN [phone] PT FOR POSSIBLE DC LATER TODAY; STILL DESATS WITH ACTIVITY. WILL NEED HOME OXYGEN ARRANGED.  FAXED REFERRAL TO [facility], PER INSURANCE CONTRACTS.  [facility] TO DELIVER PORTABLE TANK TO PT'S ROOM PRIOR TO DC HOME.  04/05/13 Akeelah Seppala,RN,BSN [phone] MET WITH PT AND MOTHER; GAVE XARELTO CAREPATH CARD TO ASSIST WITH COPAYS.  PT WILL BE ABLE TO OBTAIN MED FOR $10/MONTH AFTER ACTIVATION OF CARD.  HE IS APPRECIATIVE. MAY NEED HOME O2 IF UNABLE TO WEAN.  WILL FOLLOW.  04/04/2013 1715 Sent benefits check for Xarelto for copay price. Isidoro Donning RN CCM Case Mgmt phone [phone]

## 2013-04-06 ENCOUNTER — Inpatient Hospital Stay (HOSPITAL_COMMUNITY): Payer: 59

## 2013-04-06 LAB — CBC
HCT: 41.6 % (ref 39.0–52.0)
Hemoglobin: 14.3 g/dL (ref 13.0–17.0)
MCHC: 34.4 g/dL (ref 30.0–36.0)

## 2013-04-06 LAB — COMPREHENSIVE METABOLIC PANEL
Alkaline Phosphatase: 40 U/L (ref 39–117)
BUN: 12 mg/dL (ref 6–23)
CO2: 33 mEq/L — ABNORMAL HIGH (ref 19–32)
Chloride: 96 mEq/L (ref 96–112)
GFR calc Af Amer: >90 mL/min (ref >90)
GFR calc non Af Amer: >90 mL/min (ref >90)
Glucose, Bld: 100 mg/dL — ABNORMAL HIGH (ref 70–99)
Potassium: 3.6 mEq/L (ref 3.5–5.1)
Total Bilirubin: 0.4 mg/dL (ref 0.3–1.2)

## 2013-04-06 MED ORDER — OXYCODONE-ACETAMINOPHEN 5-325 MG PO TABS: 1.0000 | ORAL_TABLET | Freq: Four times a day (QID) | ORAL | Status: DC | PRN

## 2013-04-06 MED ORDER — SODIUM CHLORIDE 0.9 % IV SOLN: Freq: Once | INTRAVENOUS | Status: DC

## 2013-04-06 MED ORDER — RIVAROXABAN 20 MG PO TABS: 20.0000 mg | ORAL_TABLET | Freq: Every day | ORAL | Status: DC

## 2013-04-06 MED ORDER — RIVAROXABAN 15 MG PO TABS: 15.0000 mg | ORAL_TABLET | Freq: Two times a day (BID) | ORAL | Status: DC

## 2013-04-06 MED ORDER — TECHNETIUM TC 99M MEBROFENIN IV KIT
5.0000 | PACK | Freq: Once | INTRAVENOUS | Status: AC | PRN
  Administered 2013-04-06: 5 via INTRAVENOUS

## 2013-04-06 MED ORDER — NICOTINE 21 MG/24HR TD PT24: 1.0000 | MEDICATED_PATCH | Freq: Every day | TRANSDERMAL | Status: DC

## 2013-04-06 MED ORDER — STERILE WATER FOR INJECTION IJ SOLN
5.0000 mL | Freq: Once | INTRAMUSCULAR | Status: AC
  Administered 2013-04-06: 5 mL via INTRAMUSCULAR
  Filled 2013-04-06: qty 5

## 2013-04-06 MED ORDER — SINCALIDE 5 MCG IJ SOLR
0.0200 ug/kg | Freq: Once | INTRAMUSCULAR | Status: AC
  Administered 2013-04-06: 1.6 ug via INTRAVENOUS

## 2013-04-06 MED ORDER — ONDANSETRON 4 MG PO TBDP: 4.0000 mg | ORAL_TABLET | Freq: Three times a day (TID) | ORAL | Status: DC | PRN

## 2013-04-06 MED ORDER — OMEPRAZOLE 20 MG PO CPDR: 20.0000 mg | DELAYED_RELEASE_CAPSULE | Freq: Every day | ORAL | Status: DC

## 2013-04-06 NOTE — Progress Notes (Signed)
Pt DCing home with family.  DC instructions and prescriptions given and reviewed.  All questions answered.  Await home O2 delivery.

## 2013-04-06 NOTE — Progress Notes (Signed)
SATURATION QUALIFICATIONS: (This note is used to comply with regulatory documentation for home oxygen)  Patient Saturations on Room Air at Rest = 93%  Patient Saturations on Room Air while Ambulating = 87%  Patient Saturations on 2 Liters of oxygen while Ambulating = 94%  Please briefly explain why patient needs home oxygen: 

## 2013-04-06 NOTE — Discharge Summary (Signed)
[facility]  Physician Discharge Summary   Patient ID: Gerald Fernandez MRN: [id number] DOB/AGE: [birth year] 47 y.o.  Admit date: 04/03/2013 Discharge date: 04/06/2013  PCP: Carollee Herter, MD  DISCHARGE DIAGNOSES:  Principal Problem:   Pulmonary embolism Active Problems:   Pleuritic chest pain   Tobacco abuse   RECOMMENDATIONS FOR OUTPATIENT FOLLOW UP: 1. Needs follow up next week. 2. Have written note to stay off of work till 04/14/13. Please determine at follow up if he needs to stay off longer 3. He needs home oxygen currently. Please reassess O2 requirements at follow up.  DISCHARGE CONDITION: fair  Diet recommendation: Regular  Filed Weights   04/04/13 0611 04/05/13 0411 04/06/13 0500  Weight: 80.5 kg (177 lb 7.5 oz) 82.464 kg (181 lb 12.8 oz) 78.608 kg (173 lb 4.8 oz)    INITIAL HISTORY: Gerald Fernandez is a 47 y.o. Caucasian male with no significant past medical history except for tobacco use who presents with the above complaints. Patient recently traveled to [place] with wife by car on 03/24/2013 and returned back to 03/29/2013 for at least 10-13 hours each way. After coming back [place], he noted right rib pain. Initially he thought it was due to gas and he took some Advil and Alka-Seltzer without any relief. As the pain continued to worsen and especially when taking shallow breath he presented to the emergency department for further evaluation. Imaging showed pulmonary embolism in the right lower and middle lobes, as a result the hospitalist service was asked to admit the patient for further care and management. He was also experiencing severe nausea. No vomiting but has poor appetite.   Consultations:  none  HOSPITAL COURSE:   Acute Right sided Pulmonary embolism  Precipitated by recent road trip to [place] and back. He was initiated on Rivaroxaban. He has mild hemoptysis which should subside eventually. He does have pleuritic chest pain. This is  improving and he will given prescriptions for pain medications. He will require oxygen a home for hypoxia of 87% at room air with minimal exertion. This should be a temporary requirement. Since there is a clear cause for the PE no hypercoagulable work up is necessary. PCP to determine duration of anticoagulation. Since this was a first event for him and was provoked by road trip, 3 months treatment should suffice.  Nausea and Abdominal Pain  Patient was experiencing significant nausea. No vomiting in last 36 hrs. GB was abnormal on [place] with polyps. LFt's were normal. He was having RUQ tenderness. He  underwent HIDA today which was normal (see below). RUQ pain is probably due to the PE. Nausea could be from the acute process as well. No other concerns identified on [place]. Abdomen is soft except for RUQ tenderness. He has been tolerating some diet. Have reassured him. He will be given antiemetics. He will follow up with PCP. Seek attention if symptoms worsen or if he starts vomiting. PPI may help as well.  Tobacco abuse  Counseled on cessation. Nicotine patch prescribed.   Elevated WBC likely from inflammation. He is afebrile.  Patient is stable. He will be setup with home oxygen. Nausea is stable and has been tolerating some diet. He can be safely discharged. Return if symptoms get worse.   PERTINENT LABS:  The results of significant diagnostics from this hospitalization (including imaging, microbiology, ancillary and laboratory) are listed below for reference.     Labs: Basic Metabolic Panel:  Recent Labs Lab 04/03/13 2255 04/05/13 0530 04/06/13 0500  NA 137 136 137  K 3.8 3.5 3.6  CL 101 98 96  CO2 27 30 33*  GLUCOSE 110* 108* 100*  BUN 12 10 12   CREATININE 0.67 0.70 0.71  CALCIUM 9.3 9.2 9.4   Liver Function Tests:  Recent Labs Lab 04/05/13 0530 04/06/13 0500  AST 16 12  ALT 16 12  ALKPHOS 41 40  BILITOT 0.5 0.4  PROT 6.6 6.5  ALBUMIN 3.3* 3.1*    Recent Labs Lab  04/05/13 0530  LIPASE 14   CBC:  Recent Labs Lab 04/03/13 2255 04/05/13 0530 04/06/13 0500  WBC 13.9* 13.9* 16.0*  HGB 15.3 14.4 14.3  HCT 43.7 41.1 41.6  MCV 87.9 89.3 88.9  PLT 188 176 178   Cardiac Enzymes: No results found for this basename: CKTOTAL, CKMB, CKMBINDEX, TROPONINI,  in the last 168 hours BNP: BNP (last 3 results)  Recent Labs  04/03/13 2255  PROBNP 27.1    IMAGING STUDIES Dg Chest 2 View  04/03/2013   *RADIOLOGY REPORT*  Clinical Data: Right-sided chest and flank pain.  CHEST - 2 VIEW  Comparison: 07/23/2006.  Findings: Mildly prominent markings at the bases, with possible mild atelectasis on the left where the diaphragm is slightly elevated compared to prior.  No effusion, consolidation, or pneumothorax.  Normal heart size.  IMPRESSION: No evidence of acute cardiopulmonary disease.   Original Report Authenticated By: Tiburcio Pea   Ct Angio Chest Pe W/cm &/or Wo Cm  04/04/2013   *RADIOLOGY REPORT*  Clinical Data: , elevated D-dimer  CT ANGIOGRAPHY CHEST  Technique:  Multidetector CT imaging of the chest using the standard protocol during bolus administration of intravenous contrast. Multiplanar reconstructed images including MIPs were obtained and reviewed to evaluate the vascular anatomy.  Contrast: 80mL OMNIPAQUE IOHEXOL 350 MG/ML SOLN  Comparison: Prior radiograph performed on 04/03/2013  Findings: There are no pathologically enlarged mediastinal, hilar, or axillary lymph nodes.  The aorta and great vessels demonstrate normal contrast enhanced appearance.  The heart size is within normal limits.  There no pericardial effusion.  Multiple filling defects are seen within several segmental pulmonary arteries within the right lower lobe (series 4, image 62, 64, 66).  Additional filling defects are seen within several segmental branches supplying the medial lateral segments of the right middle lobe as well, also consistent with pulmonary emboli. No left-sided filling  defects are seen.  Patchy linear opacities within the dependent portions of both lower lobes most likely reflect atelectasis.  There is no pulmonary edema.  No pleural effusion.  There is no pneumothorax.  The visualized portions of the upper abdomen are unremarkable.  No acute osseous abnormalities are identified.  IMPRESSION: Multiple filling defects within the segmental branches of the right lower and right middle lobes as above, consistent with acute pulmonary emboli.  Critical Value/emergent results were called by telephone at the time of interpretation on 04/04/13 at 04:17 a.m. to Dr. Benjiman Core, who verbally acknowledged these results.   Original Report Authenticated By: Rise Mu, M.D.   US Abdomen Complete  04/04/2013   *RADIOLOGY REPORT*  Clinical Data:  Chest and right upper quadrant pain.  COMPLETE ABDOMINAL ULTRASOUND  Comparison:  None.  Findings:  Gallbladder:  The gallbladder is contracted.  There are 3 or 4 peripheral echogenic foci which appear non mobile, measuring 3 to 4 mm.  No shadowing or ring down artifact.  The gallbladder wall is thick in the setting of contraction.  There is focal tenderness over the gallbladder.  Common  bile duct:  Measures 2 mm.  Liver:  No focal lesion identified.  Within normal limits in parenchymal echogenicity.  IVC:  Unremarkable by gray scale.  Pancreas:  Imaged portions unremarkable.  Spleen:  Measures 5 cm.  Right Kidney:  Measures 13 cm. No hydronephrosis or focal abnormality.  Left Kidney:  Measures 13 cm. No hydronephrosis or focal abnormality.  Abdominal aorta:  No aneurysm identified.  IMPRESSION:  1.  Although the patient is tender over the gallbladder, there is no gallbladder distension to suggest cystic duct obstruction. 2.  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   Original Report Authenticated By: Tiburcio Pea   Nm Hepato W/eject Fract  04/06/2013   *RADIOLOGY REPORT*  Clinical Data: Upper abdominal pain and nausea  NUCLEAR MEDICINE  HEPATOBILIARY WITH GB, PHARM AND QUAN MEASURE/ejection fraction analysis  Views:  Anterior, right lateral right upper quadrant  Radiopharmaceutical:  Technetium 36m Choletec  Dose:  5.0 mCi  Route of administration:  Intravenous  Findings:  Liver uptake of radiotracer is normal.  There is prompt visualization of gallbladder and small bowel, indicating patency of the cystic and common bile ducts.  A weight-based dose, 1.73 mcg, of CCK was administered intravenously with calculation of the computer-generated ejection fraction of radiotracer from the gallbladder.  The ejection fraction of radiotracer from the gallbladder is normal at 57.2%, normal greater than 38%.  IMPRESSION: Normal study.   Original Report Authenticated By: Bretta Bang, M.D.    DISCHARGE EXAMINATION: Filed Vitals:   04/05/13 1425 04/05/13 2037 04/06/13 0500 04/06/13 0514  BP:  137/62  112/70  Pulse: 97 84  78  Temp:  98.6 F (37 C)  97.7 F (36.5 C)  TempSrc:      Resp:  18  18  Height:      Weight:   78.608 kg (173 lb 4.8 oz)   SpO2: 87% 94%  93%   General appearance: alert, cooperative, appears stated age and no distress Resp: clear to auscultation bilaterally Cardio: regular rate and rhythm, S1, S2 normal, no murmur, click, rub or gallop GI: Soft,mildly tender in RUQ. No change from yesterday. No rebound, rigidity or guarding. No masses or organomegaly. BS present. Extremities: extremities normal, atraumatic, no cyanosis or edema Neurologic: Alert and oriented X 3, normal strength and tone. Normal symmetric reflexes. Normal coordination and gait  DISPOSITION: Home  Discharge Orders   Future Orders Complete By Expires     Diet general  As directed     Discharge instructions  As directed     Comments:      Please follow up with your PCP early next week. Use oxygen around the clock for now.    Increase activity slowly  As directed        ALLERGIES:  Allergies  Allergen Reactions  . Penicillins Other (See  Comments)    From childhood    Current Discharge Medication List    START taking these medications   Details  nicotine (NICODERM CQ - DOSED IN MG/24 HOURS) 21 mg/24hr patch Place 1 patch onto the skin daily. Qty: 28 patch, Refills: 0    omeprazole (PRILOSEC) 20 MG capsule Take 1 capsule (20 mg total) by mouth daily. Qty: 30 capsule, Refills: 0    ondansetron (ZOFRAN-ODT) 4 MG disintegrating tablet Take 1 tablet (4 mg total) by mouth every 8 (eight) hours as needed for nausea. Qty: 20 tablet, Refills: 0    oxyCODONE-acetaminophen (PERCOCET/ROXICET) 5-325 MG per tablet Take 1-2 tablets by mouth every 6 (  six) hours as needed for pain. Qty: 30 tablet, Refills: 0    !! Rivaroxaban (XARELTO) 15 MG TABS tablet Take 1 tablet (15 mg total) by mouth 2 (two) times daily with a meal. For 19 more days followed by the other prescription for 20mg  once daily Qty: 38 tablet, Refills: 0    !! Rivaroxaban (XARELTO) 20 MG TABS tablet Take 1 tablet (20 mg total) by mouth daily. Start taking after you finish taking the 19 days of 15mg  twice daily dosing. Qty: 30 tablet, Refills: 3     !! - Potential duplicate medications found. Please discuss with provider.    CONTINUE these medications which have NOT CHANGED   Details  ibuprofen (ADVIL,MOTRIN) 200 MG tablet Take 600 mg by mouth every 6 (six) hours as needed for pain.     Multiple Vitamin (MULTIVITAMIN) tablet Take 1 tablet by mouth daily.       STOP taking these medications     DM-Phenylephrine-Acetaminophen (ALKA-SELTZER PLS SINUS & COUGH PO)        Follow-up Information   Follow up with Carollee Herter, MD. Schedule an appointment as soon as possible for a visit in 1 week.   Contact information:   [address] [place] [address] [phone]       TOTAL DISCHARGE TIME: 35 mins  [facility]  [facility] Pager [phone]  04/06/2013, 12:29 PM

## 2013-04-12 ENCOUNTER — Encounter: Payer: Self-pay | Admitting: Family Medicine

## 2013-04-12 ENCOUNTER — Telehealth: Payer: Self-pay | Admitting: Family Medicine

## 2013-04-12 ENCOUNTER — Ambulatory Visit (INDEPENDENT_AMBULATORY_CARE_PROVIDER_SITE_OTHER): Payer: Managed Care, Other (non HMO) | Admitting: Family Medicine

## 2013-04-12 ENCOUNTER — Ambulatory Visit
Admission: RE | Admit: 2013-04-12 | Discharge: 2013-04-12 | Disposition: A | Discharge status: Home / Self Care | Payer: 59 | Source: Ambulatory Visit | Attending: Family Medicine | Admitting: Family Medicine

## 2013-04-12 VITALS — BP 120/70 | HR 70 | Resp 16 | Wt 169.0 lb

## 2013-04-12 DIAGNOSIS — I2699 Other pulmonary embolism without acute cor pulmonale: Secondary | ICD-10-CM

## 2013-04-12 DIAGNOSIS — F17200 Nicotine dependence, unspecified, uncomplicated: Secondary | ICD-10-CM

## 2013-04-12 DIAGNOSIS — S86912A Strain of unspecified muscle(s) and tendon(s) at lower leg level, left leg, initial encounter: Secondary | ICD-10-CM

## 2013-04-12 DIAGNOSIS — Z720 Tobacco use: Secondary | ICD-10-CM

## 2013-04-12 DIAGNOSIS — IMO0002 Reserved for concepts with insufficient information to code with codable children: Secondary | ICD-10-CM

## 2013-04-12 MED ORDER — NICOTINE 14 MG/24HR TD PT24: 1.0000 | MEDICATED_PATCH | TRANSDERMAL | Status: DC

## 2013-04-12 NOTE — Telephone Encounter (Signed)
FAXED

## 2013-04-12 NOTE — Patient Instructions (Signed)
Switch to Advil 800 mg at night and during the day if you need it for pain

## 2013-04-12 NOTE — Telephone Encounter (Signed)
Go ahead and send this off 

## 2013-04-12 NOTE — Progress Notes (Signed)
  Subjective:    Patient ID: Gerald Fernandez, male    DOB: 1965/09/18, 47 y.o.   MRN: [id number]  HPI Is here for recheck after recent hospitalization for pulmonary and was. He drove 12 hours to go to [place]. He also had a knee injury when he was there. When he returned he had right-sided chest pain and was admitted for PE. He scan was reviewed. He is still having right-sided chest pain which is interfering with his ability to sleep. He isn't short of breath and has been continuing to cough up blood-tinged sputum.   Review of Systems     Objective:   Physical Exam Alert and slightly tachypneic. Pulse ox after walking in my office was 95. Exam of his lungs do show decreased breath sounds on the right in the lower lung bases. Left knee exam shows no effusion. Ligaments intact. Minimal tenderness over medial joint line with McMurray's testing causing slight discomfort.      Assessment & Plan:  PE (pulmonary embolism)  Knee strain, left, initial encounter  the x-ray was negative except for damage from the PE. Recommend he continue on pain medicine and try Advil instead of the codeine. He'll return here in one week for recheck. I will also place him on 14 mg Nicoderm to help with his continued smoking cessation.

## 2013-04-19 ENCOUNTER — Ambulatory Visit (INDEPENDENT_AMBULATORY_CARE_PROVIDER_SITE_OTHER): Payer: Managed Care, Other (non HMO) | Admitting: Family Medicine

## 2013-04-19 ENCOUNTER — Encounter: Payer: Self-pay | Admitting: Family Medicine

## 2013-04-19 VITALS — BP 124/80 | HR 88 | Wt 169.0 lb

## 2013-04-19 DIAGNOSIS — Z7189 Other specified counseling: Secondary | ICD-10-CM

## 2013-04-19 DIAGNOSIS — Z719 Counseling, unspecified: Secondary | ICD-10-CM

## 2013-04-19 DIAGNOSIS — I2699 Other pulmonary embolism without acute cor pulmonale: Secondary | ICD-10-CM

## 2013-04-19 NOTE — Progress Notes (Signed)
  Subjective:    Patient ID: Gerald Fernandez, male    DOB: 25-Sep-1965, 47 y.o.   MRN: [id number]  HPI He is here for recheck. He admits to being quite stressed over this most recent episode of pulmonary and was. He is also had some difficulty dealing with the recent death of his onto he was very close to. He admits to being depressed over this. He also is had difficulty with some shortness of breath and is still coughing up small amounts of brownish material.   Review of Systems     Objective:   Physical Exam Alert and in no distress otherwise not examined       Assessment & Plan:  PE (pulmonary embolism)  Bereavement counseling  reinforce the fact that him being stressed and depressed over this is not unusual and I would be more concerned if you were not feeling like this. We discussed him returning to work. He is to increase his physical activities at home and will call me Thursday. He would like to do is start back to work next week. I would like to start him initially at 4 hours per week, then after another week back to full-time.

## 2013-04-21 ENCOUNTER — Encounter: Payer: Self-pay | Admitting: Internal Medicine

## 2013-04-21 ENCOUNTER — Ambulatory Visit (INDEPENDENT_AMBULATORY_CARE_PROVIDER_SITE_OTHER): Payer: 59 | Admitting: Internal Medicine

## 2013-04-21 ENCOUNTER — Encounter: Payer: Self-pay | Admitting: *Deleted

## 2013-04-21 VITALS — BP 100/66 | HR 82 | Temp 98.3°F | Ht 70.0 in | Wt 173.0 lb

## 2013-04-21 DIAGNOSIS — R071 Chest pain on breathing: Secondary | ICD-10-CM

## 2013-04-21 DIAGNOSIS — R0781 Pleurodynia: Secondary | ICD-10-CM

## 2013-04-21 DIAGNOSIS — I2699 Other pulmonary embolism without acute cor pulmonale: Secondary | ICD-10-CM

## 2013-04-21 NOTE — Progress Notes (Signed)
Subjective:    Patient ID: Gerald Fernandez, male    DOB: Feb 15, 1966 MRN: [id number]  HPI   87 yowm quit smoking  04/03/2013 with dx of PE p trip to [place] and back self referred for pulmonary eval.   04/21/2013 1st pulmonary/Gerald Fernandez cc abrupt R Cp 04/03/13 radiated from under R breast to lateral CW and sob.    Admit date: 04/03/2013  Discharge date: 04/06/2013  PCP: Gerald Herter, MD  DISCHARGE DIAGNOSES:  Principal Problem:  Pulmonary embolism  Active Problems:  Pleuritic chest pain  Tobacco abuse  RECOMMENDATIONS FOR OUTPATIENT FOLLOW UP:  1. Needs follow up next week. 2. Have written note to stay off of work till 04/14/13. Please determine at follow up if he needs to stay off longer 3. He needs home oxygen currently. Please reassess O2 requirements at follow up. DISCHARGE CONDITION: fair  Diet recommendation: Regular  Filed Weights    04/04/13 0611  04/05/13 0411  04/06/13 0500   Weight:  80.5 kg (177 lb 7.5 oz)  82.464 kg (181 lb 12.8 oz)  78.608 kg (173 lb 4.8 oz)   INITIAL HISTORY:  Gerald Fernandez is a 47 y.o. Caucasian male with no significant past medical history except for tobacco use who presents with the above complaints. Patient recently traveled to [place] with wife by car on 03/24/2013 and returned back to 03/29/2013 for at least 10-13 hours each way. After coming back [place], he noted right rib pain. Initially he thought it was due to gas and he took some Advil and Alka-Seltzer without any relief. As the pain continued to worsen and especially when taking shallow breath he presented to the emergency department for further evaluation. Imaging showed pulmonary embolism in the right lower and middle lobes, as a result the hospitalist service was asked to admit the patient for further care and management. He was also experiencing severe nausea. No vomiting but has poor appetite  04/21/13 ov/ Gerald Fernandez min R pain with deep insp, no limiting sob, no leg swelling. Gives additional hx  of orthopedic injuries to L knee and L ankle but no symptoms of dvt, previoiusly wore tight calf high brack on L ankle. No venous dopplers needed.   Sleeping ok without nocturnal  or early am exacerbation  of respiratory  c/o's or need for noct saba. Also denies any obvious fluctuation of symptoms with weather or environmental changes or other aggravating or alleviating factors except as outlined above   Current Medications, Allergies, Past Medical History, Past Surgical History, Family History, and Social History were reviewed in [employer] record.           Review of Systems  Constitutional: Positive for appetite change. Negative for fever, chills, diaphoresis, activity change, fatigue and unexpected weight change.  HENT: Negative for hearing loss, ear pain, nosebleeds, congestion, sore throat, facial swelling, rhinorrhea, sneezing, mouth sores, trouble swallowing, neck pain, neck stiffness, dental problem, voice change, postnasal drip, sinus pressure, tinnitus and ear discharge.   Eyes: Negative for photophobia, discharge, itching and visual disturbance.  Respiratory: Positive for shortness of breath. Negative for apnea, cough, choking, chest tightness, wheezing and stridor.   Cardiovascular: Negative for chest pain, palpitations and leg swelling.  Gastrointestinal: Negative for nausea, vomiting, abdominal pain, constipation, blood in stool and abdominal distention.  Genitourinary: Negative for dysuria, urgency, frequency, hematuria, flank pain, decreased urine volume and difficulty urinating.  Musculoskeletal: Negative for myalgias, back pain, joint swelling, arthralgias and gait problem.  Skin: Negative  for color change, pallor and rash.  Neurological: Negative for dizziness, tremors, seizures, syncope, speech difficulty, weakness, light-headedness, numbness and headaches.  Hematological: Negative for adenopathy. Does not bruise/bleed easily.   Psychiatric/Behavioral: Negative for confusion, sleep disturbance and agitation. The patient is not nervous/anxious.        Objective:   Physical Exam   amb wm nad  Wt Readings from Last 3 Encounters:  04/21/13 173 lb (78.472 kg)  04/19/13 169 lb (76.658 kg)  04/12/13 169 lb (76.658 kg)     HEENT: nl dentition, turbinates, and orophanx. Nl external ear canals without cough reflex   NECK :  without JVD/Nodes/TM/ nl carotid upstrokes bilaterally   LUNGS: no acc muscle use, clear to A and P bilaterally without cough on insp or exp maneuvers   CV:  RRR  no s3 or murmur or increase in P2, no edema   ABD:  soft and nontender with nl excursion in the supine position. No bruits or organomegaly, bowel sounds nl  MS:  warm without deformities, calf tenderness, cyanosis or clubbing  SKIN: warm and dry without lesions    NEURO:  alert, approp, no deficits      cxr 04/12/13  Posterior right lower lobe opacity, likely reflecting pulmonary  infarct given the recent CT, pneumonia not excluded       Assessment & Plan:

## 2013-04-21 NOTE — Patient Instructions (Addendum)
Continue xarelto x 6 months and you need to return here venous doppler and screen for hypercoagulability.  Maintaining smoking abstinence is critical at this point   Return here before your xarelto runs out if you want me to refill it.   Ok to return to work Monday full duty 04/26/13

## 2013-04-22 NOTE — Assessment & Plan Note (Signed)
Dx 04/03/13 risk factors smoking, orthopedic injuries to L knee and Ankle   He has not had hypercoagulable w/u but I would rec  Months rx and then if no symptoms at all needs venous dopplers of both legs before d/c xarelto.  Also should not wear any brace on his L ankle that compresses his calf in any way  Ok to return to work  Discussed natural hx of relatively small peripheral pe's / pulmonary infarction,no evidence at all of PAH

## 2013-04-22 NOTE — Assessment & Plan Note (Signed)
prob infarct s sign effusion should gradually heal completely with min impact on aerobic tolerance of significant pain

## 2013-05-07 ENCOUNTER — Other Ambulatory Visit: Payer: Self-pay | Admitting: Family Medicine

## 2013-05-07 MED ORDER — NICOTINE 7 MG/24HR TD PT24: 1.0000 | MEDICATED_PATCH | TRANSDERMAL | Status: DC

## 2013-05-07 NOTE — Telephone Encounter (Signed)
IS THIS OK 

## 2013-05-12 ENCOUNTER — Telehealth: Payer: Self-pay | Admitting: Internal Medicine

## 2013-05-12 MED ORDER — NICOTINE 14 MG/24HR TD PT24: 1.0000 | MEDICATED_PATCH | TRANSDERMAL | Status: DC

## 2013-05-12 NOTE — Telephone Encounter (Signed)
Rx was sent and pt made aware Nothing further needed per pt

## 2013-06-18 ENCOUNTER — Encounter: Payer: Self-pay | Admitting: *Deleted

## 2013-06-18 ENCOUNTER — Ambulatory Visit (INDEPENDENT_AMBULATORY_CARE_PROVIDER_SITE_OTHER)
Admission: RE | Admit: 2013-06-18 | Discharge: 2013-06-18 | Disposition: A | Discharge status: Home / Self Care | Payer: 59 | Source: Ambulatory Visit | Attending: Internal Medicine | Admitting: Internal Medicine

## 2013-06-18 ENCOUNTER — Encounter: Payer: Self-pay | Admitting: Internal Medicine

## 2013-06-18 ENCOUNTER — Ambulatory Visit (INDEPENDENT_AMBULATORY_CARE_PROVIDER_SITE_OTHER): Payer: 59 | Admitting: Internal Medicine

## 2013-06-18 VITALS — BP 124/80 | HR 70 | Ht 70.0 in | Wt 175.0 lb

## 2013-06-18 DIAGNOSIS — I2699 Other pulmonary embolism without acute cor pulmonale: Secondary | ICD-10-CM

## 2013-06-18 DIAGNOSIS — Z23 Encounter for immunization: Secondary | ICD-10-CM

## 2013-06-18 DIAGNOSIS — Z87891 Personal history of nicotine dependence: Secondary | ICD-10-CM

## 2013-06-18 NOTE — Progress Notes (Signed)
Subjective:    Patient ID: Gerald Fernandez, male    DOB: 06-04-1966 MRN: [id number]     Brief patient profile:  47 yowm quit smoking  04/03/2013 with dx of PE p trip to [place] and back self referred for pulmonary eval.   04/21/2013 1st pulmonary/Tylisa Alcivar cc abrupt R Cp 04/03/13 radiated from under R breast to lateral CW and sob.    Admit date: 04/03/2013  Discharge date: 04/06/2013  PCP: Gerald Herter, MD  DISCHARGE DIAGNOSES:    Pulmonary embolism  Active Problems:  Pleuritic chest pain  Tobacco abuse  DISCHARGE CONDITION: fair  Diet recommendation: Regular INITIAL HISTORY:  Gerald Fernandez is a 47 y.o. Caucasian male with no significant past medical history except for tobacco use who presents with the above complaints. Patient recently traveled to [place] with wife by car on 03/24/2013 and returned back to 03/29/2013 for at least 10-13 hours each way. After coming back [place], he noted right rib pain. Initially he thought it was due to gas and he took some Advil and Alka-Seltzer without any relief. As the pain continued to worsen and especially when taking shallow breath he presented to the emergency department for further evaluation. Imaging showed pulmonary embolism in the right lower and middle lobes, as a result the hospitalist service was asked to admit the patient for further care and management. He was also experiencing severe nausea. No vomiting but has poor appetite  04/21/13 ov/ Gerald Fernandez min R pain with deep insp, no limiting sob, no leg swelling. Gives additional hx of orthopedic injuries to L knee and L ankle but no symptoms of dvt, previously wore tight calf high brack on L ankle. rec Continue xarelto x 6 months and you need to return here venous doppler and screen for hypercoagulability. Maintaining smoking abstinence is critical at this point  Return here before your xarelto runs out if you want me to refill it.  Ok to return to work Monday full duty 04/26/13    06/18/2013 f/u  ov/Gerald Fernandez re: pe/ f/u cxr for atx  Chief Complaint  Patient presents with  . Follow-up    States that he is feeling better. Has a few concerns. SOB from time to time. Reports throat and lung burning.     No limiting sob, cp, leg swelling or pain  No obvious day to day or daytime variabilty or assoc chronic cough or cp or chest tightness, subjective wheeze overt sinus or hb symptoms. No unusual exp hx or h/o childhood pna/ asthma or knowledge of premature birth.  Sleeping ok without nocturnal  or early am exacerbation  of respiratory  c/o's or need for noct saba. Also denies any obvious fluctuation of symptoms with weather or environmental changes or other aggravating or alleviating factors except as outlined above   Current Medications, Allergies, Complete Past Medical History, Past Surgical History, Family History, and Social History were reviewed in [employer] record.  ROS  The following are not active complaints unless bolded sore throat, dysphagia, dental problems, itching, sneezing,  nasal congestion or excess/ purulent secretions, ear ache,   fever, chills, sweats, unintended wt loss, pleuritic or exertional cp, hemoptysis,  orthopnea pnd or leg swelling, presyncope, palpitations, heartburn, abdominal pain, anorexia, nausea, vomiting, diarrhea  or change in bowel or urinary habits, change in stools or urine, dysuria,hematuria,  rash, arthralgias, visual complaints, headache, numbness weakness or ataxia or problems with walking or coordination,  change in mood/affect or memory.  Objective:   Physical Exam   amb wm nad  Wt Readings from Last 3 Encounters:  06/18/13 175 lb (79.379 kg)  04/21/13 173 lb (78.472 kg)  04/19/13 169 lb (76.658 kg)        HEENT: nl dentition, turbinates, and orophanx. Nl external ear canals without cough reflex   NECK :  without JVD/Nodes/TM/ nl carotid upstrokes bilaterally   LUNGS: no acc muscle use,  clear to A and P bilaterally without cough on insp or exp maneuvers   CV:  RRR  no s3 or murmur or increase in P2, no edema   ABD:  soft and nontender with nl excursion in the supine position. No bruits or organomegaly, bowel sounds nl  MS:  warm without deformities, calf tenderness, cyanosis or clubbing  SKIN: warm and dry without lesions          cxr 06/18/13 Previous infiltrate has cleared. Currently, lungs appear clear        Assessment & Plan:

## 2013-06-18 NOTE — Progress Notes (Signed)
Quick Note:  Advised pt of CXR results per MW. Pt verbalized understanding and has no further questions ______ 

## 2013-06-18 NOTE — Patient Instructions (Addendum)
Please remember to go to the  x-ray department downstairs for your tests - we will call you with the results when they are available.  Keep previous appointment in February 2015 - call sooner if needed

## 2013-06-20 NOTE — Assessment & Plan Note (Signed)
Dx 04/03/13 risk factors smoking, orthopedic injuries to L knee and Ankle  - venous doppler  And echo not done   I had an extended discussion with the patient today lasting 15 to 20 minutes of a 25 minute visit on the following issues:   He has multiple somatic concerns from which I have directed him back to primary care but his cxr is not nl and he has no limiting sob or cp or leg swelling and appears to be doing very well on xarelto.  The main issue is when to stop the xarelto and weighing the longterm risk /benefit.  For now the plan is to continue the rx until Feb 2014 then regroup

## 2013-06-20 NOTE — Assessment & Plan Note (Signed)
Re-emphasize maint off cigs is key is long term lung health

## 2013-07-10 ENCOUNTER — Other Ambulatory Visit: Payer: Self-pay | Admitting: Internal Medicine

## 2013-09-16 ENCOUNTER — Other Ambulatory Visit: Payer: Self-pay | Admitting: Internal Medicine

## 2013-09-16 NOTE — Telephone Encounter (Signed)
Pt last had transdermal nicotine patch refilled 07/10/13 #28 patch x 1 refill. Last OV 06/18/13 Told to f/u in feb 2015. No pending appt. Please advise Dr. Sherene SiresWert thanks

## 2013-09-21 ENCOUNTER — Other Ambulatory Visit: Payer: Self-pay | Admitting: Internal Medicine

## 2013-10-18 ENCOUNTER — Encounter: Payer: Self-pay | Admitting: Internal Medicine

## 2013-10-18 ENCOUNTER — Ambulatory Visit (INDEPENDENT_AMBULATORY_CARE_PROVIDER_SITE_OTHER): Payer: 59 | Admitting: Internal Medicine

## 2013-10-18 VITALS — BP 128/80 | HR 68 | Temp 97.4°F | Ht 70.0 in | Wt 186.0 lb

## 2013-10-18 DIAGNOSIS — I2699 Other pulmonary embolism without acute cor pulmonale: Secondary | ICD-10-CM

## 2013-10-18 NOTE — Progress Notes (Signed)
Subjective:    Patient ID: Gerald Fernandez, male    DOB: 10/03/1965 MRN: [id number]     Brief patient profile:  47 yowm quit smoking  04/03/2013 with dx of PE p trip to [place] and back self referred for pulmonary eval.   04/21/2013 1st pulmonary/Chanan Detwiler cc abrupt R Cp 04/03/13 radiated from under R breast to lateral CW and sob.    Admit date: 04/03/2013  Discharge date: 04/06/2013  PCP: Carollee HerterLALONDE,JOHN CHARLES, MD  DISCHARGE DIAGNOSES:    Pulmonary embolism  Active Problems:  Pleuritic chest pain  Tobacco abuse  DISCHARGE CONDITION: fair  Diet recommendation: Regular INITIAL HISTORY:  Gerald Fernandez is a 48 y.o. Caucasian male with no significant past medical history except for tobacco use who presents with the above complaints. Patient recently traveled to [place] with wife by car on 03/24/2013 and returned back to 03/29/2013 for at least 10-13 hours each way. After coming back [place], he noted right rib pain. Initially he thought it was due to gas and he took some Advil and Alka-Seltzer without any relief. As the pain continued to worsen and especially when taking shallow breath he presented to the emergency department for further evaluation. Imaging showed pulmonary embolism in the right lower and middle lobes, as a result the hospitalist service was asked to admit the patient for further care and management. He was also experiencing severe nausea. No vomiting but has poor appetite  04/21/13 ov/ Athena Baltz min R pain with deep insp, no limiting sob, no leg swelling. Gives additional hx of orthopedic injuries to L knee and L ankle but no symptoms of dvt, previously wore tight calf high brack on L ankle. rec Continue xarelto x 6 months and you need to return here venous doppler and screen for hypercoagulability. Maintaining smoking abstinence is critical at this point  Return here before your xarelto runs out if you want me to refill it.  Ok to return to work Monday full duty 04/26/13    06/18/2013 f/u  ov/Yuridiana Formanek re: pe/ f/u cxr for atx  Chief Complaint  Patient presents with  . Follow-up    States that he is feeling better. Has a few concerns. SOB from time to time. Reports throat and lung burning.   No limiting sob, cp, leg swelling or pain rec No change rx   10/18/2013 f/u ov/Melvina Pangelinan re: PE sp  m xarelto Chief Complaint  Patient presents with  . Follow-up    Pt states he feels that his breathing has improved. Has occ tightness in chest.      Not back doing any aerobics yet but Not limited by breathing from desired activities    Taking motrin daily usually around 3 due to L ankle pain, occ back pain.  No leg swelling   No obvious day to day or daytime variabilty or assoc chronic cough or cp or chest tightness, subjective wheeze overt sinus or hb symptoms. No unusual exp hx or h/o childhood pna/ asthma or knowledge of premature birth.  Sleeping ok without nocturnal  or early am exacerbation  of respiratory  c/o's or need for noct saba. Also denies any obvious fluctuation of symptoms with weather or environmental changes or other aggravating or alleviating factors except as outlined above   Current Medications, Allergies, Complete Past Medical History, Past Surgical History, Family History, and Social History were reviewed in [facility] electronic medical record.  ROS  The following are not active complaints unless bolded sore throat, dysphagia, dental  problems, itching, sneezing,  nasal congestion or excess/ purulent secretions, ear ache,   fever, chills, sweats, unintended wt loss, pleuritic or exertional cp, hemoptysis,  orthopnea pnd or leg swelling, presyncope, palpitations, heartburn, abdominal pain, anorexia, nausea, vomiting, diarrhea  or change in bowel or urinary habits, change in stools or urine, dysuria,hematuria,  rash, arthralgias, visual complaints, headache, numbness weakness or ataxia or problems with walking or coordination,  change in mood/affect or memory.                      Objective:   Physical Exam   amb wm nad  Wt Readings from Last 3 Encounters:  10/18/13 186 lb (84.369 kg)  06/18/13 175 lb (79.379 kg)  04/21/13 173 lb (78.472 kg)       HEENT: nl dentition, turbinates, and orophanx. Nl external ear canals without cough reflex   NECK :  without JVD/Nodes/TM/ nl carotid upstrokes bilaterally   LUNGS: no acc muscle use, clear to A and P bilaterally without cough on insp or exp maneuvers   CV:  RRR  no s3 or murmur or increase in P2, no edema   ABD:  soft and nontender with nl excursion in the supine position. No bruits or organomegaly, bowel sounds nl  MS:  warm without deformities, calf tenderness, cyanosis or clubbing  SKIN: warm and dry without lesions          cxr 06/18/13 Previous infiltrate has cleared. Currently, lungs appear clear        Assessment & Plan:

## 2013-10-18 NOTE — Assessment & Plan Note (Signed)
Dx 04/03/13 risk factors smoking, orthopedic injuries to L knee and Ankle > rx 6 m xarelto - venous doppler  And echo not done at dx > ordered  Discussed in detail all the  indications, usual  risks and alternatives  relative to the benefits with patient who agrees to proceed with repeat studies then consider d/c xarelto > return for hypercoagulability profile optional as fm hx neg and this was provoked  Cautioned about using nsaids both now and in future> f/u ortho for ankle injury

## 2013-10-18 NOTE — Patient Instructions (Addendum)
Please see patient coordinator before you leave today  to schedule venous dopplers and echocardiogram and we will call you when then they return  For joint pain, ok to use up to 3-4 with meals per day but if can't do without it need to return to see Dr Murphy/Wainer.

## 2013-10-21 ENCOUNTER — Ambulatory Visit (HOSPITAL_BASED_OUTPATIENT_CLINIC_OR_DEPARTMENT_OTHER): Payer: 59

## 2013-10-21 DIAGNOSIS — I059 Rheumatic mitral valve disease, unspecified: Secondary | ICD-10-CM | POA: Insufficient documentation

## 2013-10-21 DIAGNOSIS — I2699 Other pulmonary embolism without acute cor pulmonale: Secondary | ICD-10-CM | POA: Insufficient documentation

## 2013-10-21 DIAGNOSIS — I079 Rheumatic tricuspid valve disease, unspecified: Secondary | ICD-10-CM | POA: Insufficient documentation

## 2013-10-21 DIAGNOSIS — Z87891 Personal history of nicotine dependence: Secondary | ICD-10-CM | POA: Insufficient documentation

## 2013-10-22 ENCOUNTER — Encounter: Payer: Self-pay | Admitting: Internal Medicine

## 2013-10-22 ENCOUNTER — Ambulatory Visit (HOSPITAL_COMMUNITY)
Admission: RE | Admit: 2013-10-22 | Discharge: 2013-10-22 | Disposition: A | Discharge status: Home / Self Care | Payer: 59 | Source: Ambulatory Visit | Attending: Internal Medicine | Admitting: Internal Medicine

## 2013-10-22 DIAGNOSIS — Z87891 Personal history of nicotine dependence: Secondary | ICD-10-CM

## 2013-10-22 DIAGNOSIS — I059 Rheumatic mitral valve disease, unspecified: Secondary | ICD-10-CM

## 2013-10-22 DIAGNOSIS — I2699 Other pulmonary embolism without acute cor pulmonale: Secondary | ICD-10-CM

## 2013-10-22 DIAGNOSIS — R0781 Pleurodynia: Secondary | ICD-10-CM

## 2013-10-22 NOTE — Progress Notes (Signed)
Echocardiogram 2D Echocardiogram has been performed.  Dorothey BasemanReel, Jerris Keltz M 10/22/2013, 4:06 PM

## 2013-10-23 ENCOUNTER — Encounter: Payer: Self-pay | Admitting: Internal Medicine

## 2013-10-25 NOTE — Progress Notes (Signed)
Quick Note:  LMTCB ______ 

## 2013-10-27 NOTE — Progress Notes (Signed)
Quick Note:  Spoke with pt and notified of results per Dr. Wert. Pt verbalized understanding and denied any questions.  ______ 

## 2013-12-09 ENCOUNTER — Other Ambulatory Visit: Payer: Self-pay | Admitting: Internal Medicine

## 2014-02-11 ENCOUNTER — Other Ambulatory Visit: Payer: Self-pay | Admitting: Internal Medicine

## 2015-06-14 ENCOUNTER — Telehealth: Payer: Self-pay | Admitting: Internal Medicine

## 2015-06-14 NOTE — Telephone Encounter (Signed)
Rec'd a request for records from PCP Dr. Link SnufferHolwerda. Faxed 18 pages on 06/14/2015 to Fax # [phone]

## 2016-06-13 ENCOUNTER — Ambulatory Visit (INDEPENDENT_AMBULATORY_CARE_PROVIDER_SITE_OTHER): Payer: Managed Care, Other (non HMO) | Admitting: Orthopedic Surgery

## 2016-06-17 ENCOUNTER — Ambulatory Visit (INDEPENDENT_AMBULATORY_CARE_PROVIDER_SITE_OTHER): Payer: 59 | Admitting: Orthopedic Surgery

## 2016-06-17 DIAGNOSIS — M19072 Primary osteoarthritis, left ankle and foot: Secondary | ICD-10-CM | POA: Diagnosis not present

## 2017-03-27 ENCOUNTER — Encounter (INDEPENDENT_AMBULATORY_CARE_PROVIDER_SITE_OTHER): Payer: Self-pay | Admitting: Orthopedic Surgery

## 2017-03-27 ENCOUNTER — Ambulatory Visit (INDEPENDENT_AMBULATORY_CARE_PROVIDER_SITE_OTHER): Payer: 59 | Admitting: Orthopedic Surgery

## 2017-03-27 DIAGNOSIS — M25872 Other specified joint disorders, left ankle and foot: Secondary | ICD-10-CM | POA: Insufficient documentation

## 2017-03-27 MED ORDER — LIDOCAINE HCL 1 % IJ SOLN
2.0000 mL | INTRAMUSCULAR | Status: AC | PRN
  Administered 2017-03-27: 2 mL

## 2017-03-27 MED ORDER — METHYLPREDNISOLONE ACETATE 40 MG/ML IJ SUSP
40.0000 mg | INTRAMUSCULAR | Status: AC | PRN
  Administered 2017-03-27: 40 mg via INTRA_ARTICULAR

## 2017-03-27 NOTE — Progress Notes (Signed)
Office Visit Note   Patient: Gerald Fernandez           Date of Birth: 02/23/1966           MRN: [id number] Visit Date: 03/27/2017              Requested by: No referring provider defined for this encounter. PCP: System, Pcp Not In  Chief Complaint  Patient presents with  . Left Ankle - Pain      HPI: Patient is a 51 year old gentleman previous ankle fracture who is been having impingement syndrome. In October of last year he underwent a intra-articular injection that this provided him good temporary relief. Patient states he is having recurrent increasing pain with activities of daily living primarily over the anterior lateral joint line.  Assessment & Plan: Visit Diagnoses:  1. Impingement syndrome of left ankle     Plan: Left ankle was injected without complications. Patient states he would like to hold off on arthroscopic intervention until he is in a less busy season for work.  Follow-Up Instructions: Return if symptoms worsen or fail to improve.   Ortho Exam  Patient is alert, oriented, no adenopathy, well-dressed, normal affect, normal respiratory effort. Patient has a normal gait. He has a good dorsalis pedis pulse he has good subtalar and ankle motion anterior drawer is stable no laxity no evidence of any anterior talofibular ligament insufficiency. He is tender to palpation over lateral joint line as well as over the lateral gutter.  Imaging: No results found.  Labs: No results found for: HGBA1C, ESRSEDRATE, CRP, LABURIC, REPTSTATUS, GRAMSTAIN, CULT, LABORGA  Orders:  No orders of the defined types were placed in this encounter.  No orders of the defined types were placed in this encounter.    Procedures: Medium Joint Inj Date/Time: 03/27/2017 2:47 PM Performed by: Jacyln Carmer V Authorized by: Nadara MustardUDA, Tremel Setters V   Consent Given by:  Patient Site marked: the procedure site was marked   Timeout: prior to procedure the correct patient, procedure, and site was  verified   Indications:  Pain and diagnostic evaluation Location:  Ankle Site:  L ankle Prep: patient was prepped and draped in usual sterile fashion   Needle Size:  22 G Needle Length:  1.5 inches Approach:  Anteromedial Ultrasound Guided: No   Fluoroscopic Guidance: No   Medications:  2 mL lidocaine 1 %; 40 mg methylPREDNISolone acetate 40 MG/ML Aspiration Attempted: No   Patient tolerance:  Patient tolerated the procedure well with no immediate complications    Clinical Data: No additional findings.  ROS:  All other systems negative, except as noted in the HPI. Review of Systems  Objective: Vital Signs: There were no vitals taken for this visit.  Specialty Comments:  No specialty comments available.  PMFS History: Patient Active Problem List   Diagnosis Date Noted  . Impingement syndrome of left ankle 03/27/2017  . Pulmonary embolism (HCC) 04/04/2013  . Pleuritic chest pain 04/04/2013  . Smoking history 04/04/2013   Past Medical History:  Diagnosis Date  . Allergic rhinitis   . Allergy   . Smoker 12/02/07  . Tobacco use disorder     Family History  Problem Relation Age of Onset  . COPD Mother   . Hypertension Father     History reviewed. No pertinent surgical history. Social History   Occupational History  . Not on file.   Social History Main Topics  . Smoking status: Former Smoker    Packs/day: 1.50  Years: 28.00    Types: Cigarettes    Quit date: 04/03/2013  . Smokeless tobacco: Former NeurosurgeonUser  . Alcohol use No  . Drug use: No  . Sexual activity: Not on file

## 2018-08-06 ENCOUNTER — Encounter (INDEPENDENT_AMBULATORY_CARE_PROVIDER_SITE_OTHER): Payer: Self-pay | Admitting: Physician Assistant

## 2018-08-06 ENCOUNTER — Ambulatory Visit (INDEPENDENT_AMBULATORY_CARE_PROVIDER_SITE_OTHER): Payer: 59

## 2018-08-06 ENCOUNTER — Ambulatory Visit (INDEPENDENT_AMBULATORY_CARE_PROVIDER_SITE_OTHER): Payer: 59 | Admitting: Physician Assistant

## 2018-08-06 VITALS — Ht 70.0 in | Wt 186.0 lb

## 2018-08-06 DIAGNOSIS — M25872 Other specified joint disorders, left ankle and foot: Secondary | ICD-10-CM

## 2018-08-06 DIAGNOSIS — M19172 Post-traumatic osteoarthritis, left ankle and foot: Secondary | ICD-10-CM

## 2018-08-07 ENCOUNTER — Encounter (INDEPENDENT_AMBULATORY_CARE_PROVIDER_SITE_OTHER): Payer: Self-pay | Admitting: Physician Assistant

## 2018-08-07 DIAGNOSIS — M25872 Other specified joint disorders, left ankle and foot: Secondary | ICD-10-CM | POA: Diagnosis not present

## 2018-08-07 MED ORDER — LIDOCAINE HCL 1 % IJ SOLN
2.0000 mL | INTRAMUSCULAR | Status: AC | PRN
  Administered 2018-08-07: 2 mL

## 2018-08-07 MED ORDER — METHYLPREDNISOLONE ACETATE 40 MG/ML IJ SUSP
40.0000 mg | INTRAMUSCULAR | Status: AC | PRN
  Administered 2018-08-07: 40 mg via INTRA_ARTICULAR

## 2018-08-07 NOTE — Progress Notes (Signed)
Office Visit Note   Patient: Gerald Fernandez           Date of Birth: May 24, 1966           MRN: [id number] Visit Date: 08/06/2018              Requested by: No referring provider defined for this encounter. PCP: System, Pcp Not In  Chief Complaint  Patient presents with  . Left Ankle - Pain, Follow-up    Cortisone Inj      HPI: The patient is a 52 yo gentleman who is seen for follow up of his left ankle. He has a history of left ankle fracture remotely and has developed progressive deformity and pain over the ankle. He reports he is now getting pain at the ankle joint and over the Achilles tendon. He has noted some intermittent swelling over the joint with increased activities. He is taking ibuprofen, but notes little relief with this. He reports the pain is severe at times.  He has had cortisone injections in the past which help and comes in today requesting a cortisone injection and wants to discuss surgery as the ankle is causing a decline in his activity level and he is limiting his activities due to the pain.  He does have a history of PE following lower extremity trauma.   Assessment & Plan: Visit Diagnoses:  1. Impingement syndrome of left ankle   2. Post-traumatic arthritis of left ankle     Plan: The left ankle was injected with lidocaine and Depomedrol under sterile techniques and the patient tolerated this well. Dr. Lajoyce Corners discussed arthroscopic debridement of the left ankle with the patient and he would like to proceed with this intervention in January 2020.   Follow-Up Instructions: Return in about 6 weeks (around 09/17/2018).   Ortho Exam  Patient is alert, oriented, no adenopathy, well-dressed, normal affect, normal respiratory effort. The left ankle range of motion is limited to neutral dorsiflexion, plantarflexion is good. Pain with ankle range of motion, especially with maximal dorsiflexion. Pain over the distal achilles tendon to palpation. Pain over the lateral  subtalar joint.  No instability or laxity noted. Foot is plantigrade. Good pedal pulses. No ulcers or calluses.  Imaging: No results found. No images are attached to the encounter.  Labs: No results found for: HGBA1C, ESRSEDRATE, CRP, LABURIC, REPTSTATUS, GRAMSTAIN, CULT, LABORGA   Lab Results  Component Value Date   ALBUMIN 3.1 (L) 04/06/2013   ALBUMIN 3.3 (L) 04/05/2013    Body mass index is 26.69 kg/m.  Orders:  Orders Placed This Encounter  Procedures  . XR Ankle Complete Left   No orders of the defined types were placed in this encounter.    Procedures: Medium Joint Inj: L ankle on 08/07/2018 2:08 PM Indications: pain and diagnostic evaluation Details: 22 G 1.5 in needle, anteromedial approach Medications: 2 mL lidocaine 1 %; 40 mg methylPREDNISolone acetate 40 MG/ML Outcome: tolerated well, no immediate complications Procedure, treatment alternatives, risks and benefits explained, specific risks discussed. Consent was given by the patient. Immediately prior to procedure a time out was called to verify the correct patient, procedure, equipment, support staff and site/side marked as required. Patient was prepped and draped in the usual sterile fashion.      Clinical Data: No additional findings.  ROS:  All other systems negative, except as noted in the HPI. Review of Systems  Objective: Vital Signs: Ht 5\' 10"  (1.778 m)   Wt 186 lb (84.4 kg)  BMI 26.69 kg/m   Specialty Comments:  No specialty comments available.  PMFS History: Patient Active Problem List   Diagnosis Date Noted  . Impingement syndrome of left ankle 03/27/2017  . Pulmonary embolism (HCC) 04/04/2013  . Pleuritic chest pain 04/04/2013  . Smoking history 04/04/2013   Past Medical History:  Diagnosis Date  . Allergic rhinitis   . Allergy   . Smoker 12/02/07  . Tobacco use disorder     Family History  Problem Relation Age of Onset  . COPD Mother   . Hypertension Father     History  reviewed. No pertinent surgical history. Social History   Occupational History  . Not on file  Tobacco Use  . Smoking status: Former Smoker    Packs/day: 1.50    Years: 28.00    Pack years: 42.00    Types: Cigarettes    Last attempt to quit: 04/03/2013    Years since quitting: 5.3  . Smokeless tobacco: Former Engineer, waterUser  Substance and Sexual Activity  . Alcohol use: No  . Drug use: No  . Sexual activity: Not on file

## 2018-08-31 ENCOUNTER — Telehealth (INDEPENDENT_AMBULATORY_CARE_PROVIDER_SITE_OTHER): Payer: Self-pay

## 2018-08-31 NOTE — Telephone Encounter (Signed)
Patient is having SU Jan 7th. Patient would like to know how long will he have to be OOW after SU?  CB [phone]

## 2018-09-01 NOTE — Telephone Encounter (Signed)
Can you please call? Estimated time out of work could be 2 weeks. May be longer depending on healing but this is the estimate for this surgery.

## 2018-09-01 NOTE — Telephone Encounter (Signed)
Patient's wife called back wanting to know if she is needing to be with the patient after surgery for like a week or 2 weeks.  She requested that you call her back on the cell number at [phone], please do not call her on the home number.  Thank you.

## 2018-09-01 NOTE — Telephone Encounter (Signed)
Patient was called and left vm. If any questions pt should call back

## 2018-09-03 ENCOUNTER — Encounter: Payer: Self-pay | Admitting: Family Medicine

## 2018-09-03 ENCOUNTER — Ambulatory Visit (INDEPENDENT_AMBULATORY_CARE_PROVIDER_SITE_OTHER): Payer: 59 | Admitting: Family Medicine

## 2018-09-03 ENCOUNTER — Telehealth: Payer: Self-pay | Admitting: Family Medicine

## 2018-09-03 VITALS — BP 126/82 | HR 79 | Temp 98.2°F | Ht 69.0 in | Wt 149.8 lb

## 2018-09-03 DIAGNOSIS — F17200 Nicotine dependence, unspecified, uncomplicated: Secondary | ICD-10-CM

## 2018-09-03 DIAGNOSIS — R634 Abnormal weight loss: Secondary | ICD-10-CM

## 2018-09-03 DIAGNOSIS — Z86711 Personal history of pulmonary embolism: Secondary | ICD-10-CM

## 2018-09-03 DIAGNOSIS — Z630 Problems in relationship with spouse or partner: Secondary | ICD-10-CM

## 2018-09-03 DIAGNOSIS — M25872 Other specified joint disorders, left ankle and foot: Secondary | ICD-10-CM

## 2018-09-03 DIAGNOSIS — Z13220 Encounter for screening for lipoid disorders: Secondary | ICD-10-CM

## 2018-09-03 DIAGNOSIS — Z1211 Encounter for screening for malignant neoplasm of colon: Secondary | ICD-10-CM

## 2018-09-03 NOTE — Progress Notes (Signed)
   Subjective:    Patient ID: Gerald Fernandez, male    DOB: Jan 30, 1966, 53 y.o.   MRN: [id number]  HPI He is here after a several year absence.  Over the last year his mother and father have both died.  He was also having difficulty with his wife and they did separate.  During that timeframe he did get a girlfriend.  He also started smoking again.  He is scheduled in the near future for left foot surgery. He now realizes that he needs to make major lifestyle changes.  Apparently he is stopped seeing his girlfriend and is now living back at home.  He and his wife had started conversations also he has had conversations with his children.  They are planning on getting marriage counseling.  He also is interested in quitting smoking.  Over the same timeframe, he states that he is lost around 40 or 50 pounds.  He does have a previous history of pulmonary embolus.   Review of Systems     Objective:   Physical Exam Alert and in no distress otherwise not examined      Assessment & Plan:  Marital stress  Current smoker - Plan: CBC with Differential/Platelet, Comprehensive metabolic panel  Impingement syndrome of left ankle  History of pulmonary embolus (PE) - Plan: CBC with Differential/Platelet, Comprehensive metabolic panel  Screening for colon cancer - Plan: CBC with Differential/Platelet, Comprehensive metabolic panel, Cologuard  Screening for lipid disorders - Plan: Lipid panel  Weight loss - Plan: CBC with Differential/Platelet, Comprehensive metabolic panel, Lipid panel I explained that under the circumstances, I would not recommend adding another stress of trying to quit smoking.  Encouraged him to continue in counseling to help deal with the underlying issues of death and dying as well as marital distress.  I will start the process to get him caught up with all of his health maintenance and schedule him back here in 1 month for a recheck.  About 30 minutes, the entire time spent in  counseling and coordination of care.

## 2018-09-03 NOTE — Telephone Encounter (Signed)
Left for message to call back to set up a 1 month follow up appt per Dr Susann Givens

## 2018-09-03 NOTE — Telephone Encounter (Signed)
Patient was called and spoke to wife about her concerns for FMLA paperwork and surgery timeframe. She also wanted to know if patient will be needing a scooter; if so she will need Rx and will he be able to walk on his foot after surgery. They will also need OOW notes ant this time.

## 2018-09-04 LAB — COMPREHENSIVE METABOLIC PANEL
A/G RATIO: 1.8 (ref 1.2–2.2)
ALT: 14 IU/L (ref 0–44)
AST: 21 IU/L (ref 0–40)
Albumin: 4.4 g/dL (ref 3.5–5.5)
Alkaline Phosphatase: 38 IU/L — ABNORMAL LOW (ref 39–117)
BILIRUBIN TOTAL: 0.4 mg/dL (ref 0.0–1.2)
BUN/Creatinine Ratio: 11 (ref 9–20)
BUN: 8 mg/dL (ref 6–24)
CALCIUM: 9.8 mg/dL (ref 8.7–10.2)
CHLORIDE: 102 mmol/L (ref 96–106)
CO2: 25 mmol/L (ref 20–29)
Creatinine, Ser: 0.71 mg/dL — ABNORMAL LOW (ref 0.76–1.27)
GFR calc Af Amer: 126 mL/min/{1.73_m2} (ref >59)
GFR, EST NON AFRICAN AMERICAN: 109 mL/min/{1.73_m2} (ref >59)
GLOBULIN, TOTAL: 2.4 g/dL (ref 1.5–4.5)
Glucose: 85 mg/dL (ref 65–99)
POTASSIUM: 4.4 mmol/L (ref 3.5–5.2)
SODIUM: 141 mmol/L (ref 134–144)
Total Protein: 6.8 g/dL (ref 6.0–8.5)

## 2018-09-04 LAB — CBC WITH DIFFERENTIAL/PLATELET
BASOS: 1 %
Basophils Absolute: 0.1 10*3/uL (ref 0.0–0.2)
EOS (ABSOLUTE): 0.4 10*3/uL (ref 0.0–0.4)
EOS: 4 %
HEMATOCRIT: 47.7 % (ref 37.5–51.0)
Hemoglobin: 16.3 g/dL (ref 13.0–17.7)
IMMATURE GRANULOCYTES: 1 %
Immature Grans (Abs): 0.1 10*3/uL (ref 0.0–0.1)
LYMPHS ABS: 2.1 10*3/uL (ref 0.7–3.1)
Lymphs: 23 %
MCH: 30.9 pg (ref 26.6–33.0)
MCHC: 34.2 g/dL (ref 31.5–35.7)
MCV: 91 fL (ref 79–97)
MONOS ABS: 0.6 10*3/uL (ref 0.1–0.9)
Monocytes: 7 %
NEUTROS ABS: 6 10*3/uL (ref 1.4–7.0)
NEUTROS PCT: 64 %
Platelets: 252 10*3/uL (ref 150–450)
RBC: 5.27 x10E6/uL (ref 4.14–5.80)
RDW: 12 % — AB (ref 12.3–15.4)
WBC: 9.2 10*3/uL (ref 3.4–10.8)

## 2018-09-04 LAB — LIPID PANEL
CHOL/HDL RATIO: 3.3 ratio (ref 0.0–5.0)
Cholesterol, Total: 144 mg/dL (ref 100–199)
HDL: 43 mg/dL (ref >39)
LDL Calculated: 86 mg/dL (ref 0–99)
TRIGLYCERIDES: 76 mg/dL (ref 0–149)
VLDL Cholesterol Cal: 15 mg/dL (ref 5–40)

## 2018-09-08 ENCOUNTER — Telehealth (INDEPENDENT_AMBULATORY_CARE_PROVIDER_SITE_OTHER): Payer: Self-pay | Admitting: Orthopedic Surgery

## 2018-09-08 ENCOUNTER — Other Ambulatory Visit (INDEPENDENT_AMBULATORY_CARE_PROVIDER_SITE_OTHER): Payer: Self-pay | Admitting: Orthopedic Surgery

## 2018-09-08 DIAGNOSIS — M19172 Post-traumatic osteoarthritis, left ankle and foot: Secondary | ICD-10-CM

## 2018-09-08 MED ORDER — HYDROCODONE-ACETAMINOPHEN 5-325 MG PO TABS: 1.0000 | ORAL_TABLET | Freq: Four times a day (QID) | ORAL | 0 refills | Status: DC | PRN

## 2018-09-08 NOTE — Telephone Encounter (Signed)
[facility]

## 2018-09-08 NOTE — Telephone Encounter (Signed)
[facility] left a message stating that they are no longer accepting printed RX due to the change in the law for all controlled medications.  They are requesting that the patient's RX for Vicodin be e-scribed to their pharmacy.  If you have any questions, please give them a call at [phone].  Thank you.

## 2018-09-08 NOTE — Telephone Encounter (Signed)
Pt had a left ankle scope and debridement today and pharm will not accept the paper rx he was given. Please see message below.

## 2018-09-09 ENCOUNTER — Telehealth (INDEPENDENT_AMBULATORY_CARE_PROVIDER_SITE_OTHER): Payer: Self-pay | Admitting: Orthopedic Surgery

## 2018-09-09 ENCOUNTER — Other Ambulatory Visit (INDEPENDENT_AMBULATORY_CARE_PROVIDER_SITE_OTHER): Payer: Self-pay

## 2018-09-09 NOTE — Telephone Encounter (Signed)
Patient's wife came into the clinic and requested a work note for the patient.  His employer is needing to know that he had surgery and approximately how long he will be out of work.  CB#[phone].  Thank you.

## 2018-09-09 NOTE — Telephone Encounter (Signed)
Called pt to advise that letter is ready for pick up at the front desk. LMON VM to call with questions.

## 2018-09-22 ENCOUNTER — Encounter (INDEPENDENT_AMBULATORY_CARE_PROVIDER_SITE_OTHER): Payer: Self-pay | Admitting: Physician Assistant

## 2018-09-22 ENCOUNTER — Ambulatory Visit (INDEPENDENT_AMBULATORY_CARE_PROVIDER_SITE_OTHER): Payer: 59 | Admitting: Physician Assistant

## 2018-09-22 VITALS — Ht 69.0 in | Wt 149.8 lb

## 2018-09-22 DIAGNOSIS — M25872 Other specified joint disorders, left ankle and foot: Secondary | ICD-10-CM

## 2018-09-22 DIAGNOSIS — M19172 Post-traumatic osteoarthritis, left ankle and foot: Secondary | ICD-10-CM

## 2018-09-22 NOTE — Progress Notes (Signed)
Office Visit Note   Patient: Gerald Fernandez           Date of Birth: May 18, 1966           MRN: [id number] Visit Date: 09/22/2018              Requested by: Ronnald Nian, MD [address] [place], [place] [address] PCP: Ronnald Nian, MD  Chief Complaint  Patient presents with  . Left Foot - Routine Post Op      HPI: The patient is a 53 year old gentleman who is seen for postoperative follow-up following left ankle arthroscopic debridement on 09/08/2018.  He reports that he is noticing some clicking or popping at times when he is full weightbearing over the ankle.  He has been taking Advil during the daytime for any discomfort and occasionally taking hydrocodone at night.  He does walk a great deal for work and is concerned that he might not be able to walk comfortably for the length and duration that is required for his job.  He also wears a steel toe boot and has not been able to get back into this due to discomfort.  Assessment & Plan: Visit Diagnoses:  1. Impingement syndrome of left ankle   2. Post-traumatic arthritis of left ankle     Plan: Sutures were harvested this visit.  The patient can continue to progress with activities to tolerance.  We are going to have him stay out of work for another couple of weeks to allow his edema to reduce and his discomfort to continue to improve.  He will continue Advil during the day and hydrocodone as needed at night and ice as needed.  He will follow-up in 2 weeks.  Follow-Up Instructions: Return in about 2 weeks (around 10/06/2018).   Ortho Exam  Patient is alert, oriented, no adenopathy, well-dressed, normal affect, normal respiratory effort. There is mild edema localized to the ankle.  I cannot elicit the clicking or popping that the patient was feeling but he does have improved ankle range of motion from preoperatively.  There are no signs or symptoms of cellulitis or infection.  He has good pedal pulses.  He is cool in his toes  as before.  Sutures will be harvested this visit.  Imaging: No results found. No images are attached to the encounter.  Labs: No results found for: HGBA1C, ESRSEDRATE, CRP, LABURIC, REPTSTATUS, GRAMSTAIN, CULT, LABORGA   Lab Results  Component Value Date   ALBUMIN 4.4 09/03/2018   ALBUMIN 3.1 (L) 04/06/2013   ALBUMIN 3.3 (L) 04/05/2013    Body mass index is 22.12 kg/m.  Orders:  No orders of the defined types were placed in this encounter.  No orders of the defined types were placed in this encounter.    Procedures: No procedures performed  Clinical Data: No additional findings.  ROS:  All other systems negative, except as noted in the HPI. Review of Systems  Objective: Vital Signs: Ht 5\' 9"  (1.753 m)   Wt 149 lb 12.8 oz (67.9 kg)   BMI 22.12 kg/m   Specialty Comments:  No specialty comments available.  PMFS History: Patient Active Problem List   Diagnosis Date Noted  . Impingement syndrome of left ankle 03/27/2017  . History of pulmonary embolus (PE) 04/04/2013  . Current smoker 04/04/2013   Past Medical History:  Diagnosis Date  . Allergic rhinitis   . Allergy   . Smoker 12/02/07  . Tobacco use disorder  Family History  Problem Relation Age of Onset  . COPD Mother   . Hypertension Father     History reviewed. No pertinent surgical history. Social History   Occupational History  . Not on file  Tobacco Use  . Smoking status: Former Smoker    Packs/day: 1.50    Years: 28.00    Pack years: 42.00    Types: Cigarettes    Last attempt to quit: 04/03/2013    Years since quitting: 5.4  . Smokeless tobacco: Former Engineer, water and Sexual Activity  . Alcohol use: No  . Drug use: No  . Sexual activity: Not on file

## 2018-10-05 ENCOUNTER — Encounter: Payer: Self-pay | Admitting: Family Medicine

## 2018-10-05 ENCOUNTER — Ambulatory Visit (INDEPENDENT_AMBULATORY_CARE_PROVIDER_SITE_OTHER): Payer: 59 | Admitting: Family Medicine

## 2018-10-05 VITALS — BP 120/82 | HR 79 | Temp 98.4°F | Wt 153.8 lb

## 2018-10-05 DIAGNOSIS — Z630 Problems in relationship with spouse or partner: Secondary | ICD-10-CM

## 2018-10-05 NOTE — Progress Notes (Signed)
   Subjective:    Patient ID: Gerald Fernandez, male    DOB: 1966-07-04, 53 y.o.   MRN: [id number]  HPI He is here for consult concerning marital issues.  His previous encounter gives more data on everything going on in his life right now.  He is now in counseling with his minister who was apparently giving him some good advice on how to handle what is going on in his life.  He still does feel a lot of guilt.  He is in the process of working on himself primarily and marriage secondarily.   Review of Systems     Objective:   Physical Exam Alert and in no distress otherwise not examined PHQ 9 is 2      Assessment & Plan:  Marital stress I encouraged him to continue with counseling.  He seems to have a good handle on it.  Discussed the fact that he needs to forgive himself so he can move forward.  Also discussed using the golden rule and the serenity prayer as his goals.

## 2018-10-06 ENCOUNTER — Encounter (INDEPENDENT_AMBULATORY_CARE_PROVIDER_SITE_OTHER): Payer: Self-pay | Admitting: Physician Assistant

## 2018-10-06 ENCOUNTER — Ambulatory Visit (INDEPENDENT_AMBULATORY_CARE_PROVIDER_SITE_OTHER): Payer: 59 | Admitting: Physician Assistant

## 2018-10-06 ENCOUNTER — Ambulatory Visit (INDEPENDENT_AMBULATORY_CARE_PROVIDER_SITE_OTHER): Payer: 59

## 2018-10-06 VITALS — Ht 69.0 in | Wt 153.8 lb

## 2018-10-06 DIAGNOSIS — M25872 Other specified joint disorders, left ankle and foot: Secondary | ICD-10-CM

## 2018-10-06 MED ORDER — DICLOFENAC SODIUM 1 % TD GEL: 2.0000 g | Freq: Four times a day (QID) | TRANSDERMAL | 3 refills | Status: DC

## 2018-10-06 NOTE — Progress Notes (Signed)
Office Visit Note   Patient: Gerald Fernandez           Date of Birth: 09-23-1965           MRN: [id number] Visit Date: 10/06/2018              Requested by: Ronnald Nian, MD [address] [place], [place] [address] PCP: Ronnald Nian, MD  Chief Complaint  Patient presents with  . Left Ankle - Routine Post Op    09/08/2018 left ankle Arthro & Deb      HPI: The patient is a 53 year old gentleman who was seen for postoperative follow-up following left ankle arthroscopic debridement.  He did have advanced arthritic changes noted at the time of debridement.  Surgery was 09/08/2018.  He is continuing to note some clicking and popping at times and then pain associated after the clicking and popping occurs.  He continues to have pain with walking for longer periods of time.  He does have a compression sock and we discussed utilizing this.  He is continuing to have some numbness and tingling over the dorsum of the foot between the second and third toe as well as more proximally.  He does walk a great deal at work and has to wear a steel toe boot and protective equipment.  Assessment & Plan: Visit Diagnoses:  1. Impingement syndrome of left ankle     Plan: He is going to start utilizing his compression sock for edema control.  We also ordered some diclofenac gel 2 g 4 times daily as needed.  We did discuss that he does have advanced arthritic changes of the ankle and he is can a follow-up with Dr. Lajoyce Corners next week to discuss further.  He was given a note to return to work on October 19, 2018.  Follow-Up Instructions: Return in about 1 week (around 10/13/2018).   Ortho Exam  Patient is alert, oriented, no adenopathy, well-dressed, normal affect, normal respiratory effort. Left ankle arthroscopic ports are well-healed without signs of infection or cellulitis.  He does have audible and palpable clicking and popping with ankle range of motion but overall his range of motion remains improved.   He has good pedal pulses, but his toes are quite cool compared with the rest of the foot.  Imaging: Xr Ankle Complete Left  Result Date: 10/06/2018 3 views of the left ankle shows severe arthritic changes with some valgus collapse at and severe subtalar arthritic changes.   No images are attached to the encounter.  Labs: No results found for: HGBA1C, ESRSEDRATE, CRP, LABURIC, REPTSTATUS, GRAMSTAIN, CULT, LABORGA   Lab Results  Component Value Date   ALBUMIN 4.4 09/03/2018   ALBUMIN 3.1 (L) 04/06/2013   ALBUMIN 3.3 (L) 04/05/2013    Body mass index is 22.71 kg/m.  Orders:  Orders Placed This Encounter  Procedures  . XR Ankle Complete Left   Meds ordered this encounter  Medications  . diclofenac sodium (VOLTAREN) 1 % GEL    Sig: Apply 2 g topically 4 (four) times daily.    Dispense:  1 Tube    Refill:  3     Procedures: No procedures performed  Clinical Data: No additional findings.  ROS:  All other systems negative, except as noted in the HPI. Review of Systems  Objective: Vital Signs: Ht 5\' 9"  (1.753 m)   Wt 153 lb 12.8 oz (69.8 kg)   BMI 22.71 kg/m   Specialty Comments:  No specialty comments available.  PMFS History: Patient Active Problem List   Diagnosis Date Noted  . Impingement syndrome of left ankle 03/27/2017  . History of pulmonary embolus (PE) 04/04/2013  . Current smoker 04/04/2013   Past Medical History:  Diagnosis Date  . Allergic rhinitis   . Allergy   . Smoker 12/02/07  . Tobacco use disorder     Family History  Problem Relation Age of Onset  . COPD Mother   . Hypertension Father     History reviewed. No pertinent surgical history. Social History   Occupational History  . Not on file  Tobacco Use  . Smoking status: Former Smoker    Packs/day: 1.50    Years: 28.00    Pack years: 42.00    Types: Cigarettes    Last attempt to quit: 04/03/2013    Years since quitting: 5.5  . Smokeless tobacco: Former Engineer, waterUser  Substance and  Sexual Activity  . Alcohol use: No  . Drug use: No  . Sexual activity: Not on file

## 2018-10-13 ENCOUNTER — Encounter (INDEPENDENT_AMBULATORY_CARE_PROVIDER_SITE_OTHER): Payer: Self-pay | Admitting: Orthopedic Surgery

## 2018-10-13 ENCOUNTER — Ambulatory Visit (INDEPENDENT_AMBULATORY_CARE_PROVIDER_SITE_OTHER): Payer: 59 | Admitting: Orthopedic Surgery

## 2018-10-13 VITALS — Ht 69.0 in | Wt 153.0 lb

## 2018-10-13 DIAGNOSIS — M25872 Other specified joint disorders, left ankle and foot: Secondary | ICD-10-CM

## 2018-10-15 ENCOUNTER — Encounter (INDEPENDENT_AMBULATORY_CARE_PROVIDER_SITE_OTHER): Payer: Self-pay | Admitting: Orthopedic Surgery

## 2018-10-15 NOTE — Progress Notes (Signed)
   Office Visit Note   Patient: Gerald Fernandez           Date of Birth: 10/21/1965           MRN: [id number] Visit Date: 10/13/2018              Requested by: Ronnald Nian, MD [address] [place], [place] [address] PCP: Ronnald Nian, MD  Chief Complaint  Patient presents with  . Left Ankle - Routine Post Op    09/08/2018 left ankle scope and debridement       HPI: Patient is a 53 year old gentleman status post left ankle arthroscopic debridement.  Patient states his ankle feels the best it has in years he states he feels 1000 times better.  He states he still has some clicking in the ankle at times he states is not clicking today.  Patient states occasionally has some shooting pain down the second and third toes.  Assessment & Plan: Visit Diagnoses:  1. Impingement syndrome of left ankle     Plan: Recommended continuing increasing activities as tolerated discussed that the shooting pain is most likely due to swelling.  The clicking may resolve as the scar tissue resolves.  Follow-Up Instructions: Return if symptoms worsen or fail to improve.   Ortho Exam  Patient is alert, oriented, no adenopathy, well-dressed, normal affect, normal respiratory effort. Examination patient has minimal swelling he has good range of motion the ankle there is no clicking with passive range of motion.  There is no redness no cellulitis no signs of infection.  Imaging: No results found. No images are attached to the encounter.  Labs: No results found for: HGBA1C, ESRSEDRATE, CRP, LABURIC, REPTSTATUS, GRAMSTAIN, CULT, LABORGA   Lab Results  Component Value Date   ALBUMIN 4.4 09/03/2018   ALBUMIN 3.1 (L) 04/06/2013   ALBUMIN 3.3 (L) 04/05/2013    Body mass index is 22.59 kg/m.  Orders:  No orders of the defined types were placed in this encounter.  No orders of the defined types were placed in this encounter.    Procedures: No procedures performed  Clinical Data: No  additional findings.  ROS:  All other systems negative, except as noted in the HPI. Review of Systems  Objective: Vital Signs: Ht 5\' 9"  (1.753 m)   Wt 153 lb (69.4 kg)   BMI 22.59 kg/m   Specialty Comments:  No specialty comments available.  PMFS History: Patient Active Problem List   Diagnosis Date Noted  . Impingement syndrome of left ankle 03/27/2017  . History of pulmonary embolus (PE) 04/04/2013  . Current smoker 04/04/2013   Past Medical History:  Diagnosis Date  . Allergic rhinitis   . Allergy   . Smoker 12/02/07  . Tobacco use disorder     Family History  Problem Relation Age of Onset  . COPD Mother   . Hypertension Father     History reviewed. No pertinent surgical history. Social History   Occupational History  . Not on file  Tobacco Use  . Smoking status: Former Smoker    Packs/day: 1.50    Years: 28.00    Pack years: 42.00    Types: Cigarettes    Last attempt to quit: 04/03/2013    Years since quitting: 5.5  . Smokeless tobacco: Former Engineer, water and Sexual Activity  . Alcohol use: No  . Drug use: No  . Sexual activity: Not on file

## 2018-11-06 ENCOUNTER — Ambulatory Visit: Payer: 59 | Admitting: Psychiatry

## 2018-11-19 ENCOUNTER — Ambulatory Visit: Payer: 59 | Admitting: Psychiatry

## 2018-12-03 ENCOUNTER — Ambulatory Visit (INDEPENDENT_AMBULATORY_CARE_PROVIDER_SITE_OTHER): Payer: 59 | Admitting: Psychiatry

## 2018-12-03 ENCOUNTER — Other Ambulatory Visit: Payer: Self-pay

## 2018-12-03 ENCOUNTER — Encounter: Payer: Self-pay | Admitting: Psychiatry

## 2018-12-03 VITALS — BP 120/82 | HR 79 | Ht 69.0 in | Wt 154.0 lb

## 2018-12-03 DIAGNOSIS — F439 Reaction to severe stress, unspecified: Secondary | ICD-10-CM

## 2018-12-03 NOTE — Progress Notes (Signed)
[facility] MD/PA/NP Initial Note  12/03/2018 10:59 PM IBAN UTZ  MRN:  [id number]  I connected with patient by a video enabled telemedicine application or telephone, with their informed consent, and verified patient privacy and that I am speaking with the correct person using two identifiers.  I was located at [facility] office and patient at wife taking phone to him outside their family residence.  Time of service 50 minutes from 1305-1355.  Chief Complaint:  Chief Complaint    Trauma; Depression; Family Problem      HPI: Gerald Fernandez is provided telehealth audio appointment session as he declines video relative to his intent and need to process traumatic loss of mother followed by extramarital affair traumatic to wife and children in psychiatric interview and exam for evaluation and management of "am I depressed".  [name] discusses in painful detail his position of being the youngest of the sons but most closely bonded with his [name] mother and confidant. He has assurance for her salvation and eternal presence with God and Jesus, but he is not able to let go of her esence in his daily affairs.  He  notes anger not necessarily at God, mother or doctors over her death but appears to have shrouded his emotional and relational priorities of daily life with the pain of mother's death eminent to being anger turned inward of depression.  He talked to her every day and depended upon her for decisions, then he became the executor of her will and estate since her death in 2017-01-15 after father and prior to that a favorite aunt had died. He realizes the inadequacy of his father in family life does not recognize his identification with that after the loss of mother. He had an 10-month extramarital affair apparently sexualized that has ended and understands the pain deferred to family and wife. He knows that his wife has found  their relationship being less than fulfilled due to the patient's dependence and reliance  on his mother, and he seems to attempt to extend that now despite his available resources for resuming "to renew his mind" in his understanding of God's heaven and separation of His from this earth.  He compares without realizing management of bonding having cut the umbilical cord for his son at birth now 53 years of age, not formulating that he himself has yet to cut the cord with mother.  He endorsed tired and depressed most on his PHQ 9 but denies that his depression warrants medication or formal therapy.  He realizes mother's death represents her final separation from sin but has yet to relinquish his angry sexual sin by seeking forgiveness from his family after seeking such from the [place].  He doubts this is a midlife crisis despite turning 53 years of age last September now married for 26 years, though he will not review his formal education other than reading commentaries on the Bible.  By the end of the session, he also discusses the option of Luvox and relates that he can start to read, study, and apply his faith in God again.  He reviews that he is not suicidal, manic, psychotic, or dissociative.  He has no substance use.  He has complied with treatment for a pulmonary embolus and for impingement syndrome from degenerative change in the left ankle as the only medications he has taken. He manifests he would never harm his family in other ways but has not yet expressed to them his remorse for the sin of his anger through  fully restorin faithfulness to the family.  Visit Diagnosis:    ICD-10-CM   1. 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 Allergic rhinitis   . Allergy   . Depression   . Hx of pulmonary embolus   . Impingement syndrome of left ankle   . Smoker 12/02/07  . Tobacco use disorder     Past Surgical History:  Procedure  Laterality Date  . APPENDECTOMY      Family Psychiatric History: Mother with COPD died 01/16/2017 in cardiac surgery for illness like aunt who died in Jan 16, 2013 patient close to both not the father who died 01-17-2015 of cancer having hypertension  Family History:  Family History  Problem Relation Age of Onset  . COPD Mother   . Hypertension Father     Social History:  Social History   Socioeconomic History  . Marital status: Married    Spouse name: Not on file  . Number of children: Not on file  . Years of education: Not on file  . Highest education level: Not on file  Occupational History  . Not on file  Social Needs  . Financial resource strain: Not on file  . Food insecurity:    Worry: Never true    Inability: Never true  . Transportation needs:    Medical: No    Non-medical: No  Tobacco Use  . Smoking status: Former Smoker    Packs/day: 1.50    Years: 28.00    Pack years: 42.00    Types: Cigarettes    Last attempt to quit: 04/03/2013    Years since quitting: 5.6  . Smokeless tobacco: Former Engineer, water and Sexual Activity  . Alcohol use: No  . Drug use: No  . Sexual activity: Yes  Lifestyle  . Physical activity:    Days per week: Not on file    Minutes per session: Not on file  . Stress: Not on file  Relationships  . Social connections:    Talks on phone: Not on file    Gets together: Not on file    Attends religious service: Not on file    Active member of club or organization: Not on file    Attends meetings of clubs or organizations: Not on file    Relationship status: Not on file  Other Topics Concern  . Not on file  Social History Narrative   Series of family deaths (1) aunt very close to him of heart disease in 16-Jan-2013, (2) military father not close to family after 22 years of service died 01/17/15 of cancer, and (3) mother to whom he was most close dying in 16-Jan-2017 after 9 hours of cardiac surgery unexpected having survived breast cancer prior to that.    Allergies:   Allergies  Allergen Reactions  . Penicillins Other (See Comments)    From childhood    Metabolic Disorder Labs: No results found for: HGBA1C, MPG No results found for: PROLACTIN Lab Results  Component Value Date   CHOL 144 09/03/2018   TRIG 76 09/03/2018   HDL 43 09/03/2018   CHOLHDL 3.3 09/03/2018   LDLCALC 86 09/03/2018   No results found for: TSH  Therapeutic Level Labs: No results found for: LITHIUM No results found for: VALPROATE No components found for:  CBMZ  Current  Medications: Current Outpatient Medications  Medication Sig Dispense Refill  . [employer] NICOTINE TRANSDERMAL SYS 14 MG/24HR patch PLACE 1 PATCH ONTO THE SKIN DAILY. 28 patch 1  . diclofenac sodium (VOLTAREN) 1 % GEL Apply 2 g topically 4 (four) times daily. 1 Tube 3  . HYDROcodone-acetaminophen (NORCO/VICODIN) 5-325 MG tablet Take 1 tablet by mouth every 6 (six) hours as needed for moderate pain. 30 tablet 0  . ibuprofen (ADVIL,MOTRIN) 200 MG tablet Take 600 mg by mouth every 6 (six) hours as needed for pain.     . Multiple Vitamin (MULTIVITAMIN) tablet Take 1 tablet by mouth daily.     . Rivaroxaban (XARELTO) 20 MG TABS tablet 1 tablet daily     No current facility-administered medications for this visit.     Medication Side Effects: none  Orders placed this visit:  No orders of the defined types were placed in this encounter.   Psychiatric Specialty Exam:  Review of Systems  Constitutional: Negative.   HENT: Positive for congestion, sinus pain and sore throat.   Eyes: Negative.   Respiratory: Positive for shortness of breath.   Cardiovascular: Positive for chest pain.       Pulmonary embolus 2014  Gastrointestinal: Negative.   Genitourinary: Negative.   Musculoskeletal: Positive for joint pain.       Arthroscopic debridement left ankle for impingement  Skin: Negative.   Neurological: Negative for tremors, speech change, focal weakness, seizures and headaches.  Endo/Heme/Allergies: Positive  for environmental allergies.  Psychiatric/Behavioral: Positive for depression. Negative for hallucinations, substance abuse and suicidal ideas. The patient is not nervous/anxious.     Blood pressure 120/82, pulse 79, height 5\' 9"  (1.753 m), weight 154 lb (69.9 kg).Body mass index is 22.74 kg/m.  from appointment 10/05/2018 with Dr. Susann Givens for marital stress  General Appearance: N/A  Eye Contact:  NA  Speech:  Clear and Coherent, Normal Rate and Talkative  Volume:  Normal  Mood:  Depressed, Dysphoric and Euthymic  Affect:  Constricted and Inappropriate but congruent  Thought Process:  Coherent and Goal Directed  Orientation:  Full (Time, Place, and Person)  Thought Content: Obsessions and Rumination   Suicidal Thoughts:  No  Homicidal Thoughts:  No  Memory:  Immediate;   Good Remote;   Good  Judgement:  Fair  Insight:  Fair  Psychomotor Activity:  Normal  Concentration:  Concentration: Good and Attention Span: Good  Recall:  Good  Fund of Knowledge: Good  Language: Good  Assets:  Desire for Improvement Resilience Talents/Skills  ADL's:  Intact  Cognition: WNL  Prognosis:  Good   Screenings:  PHQ2-9     Office Visit from 10/05/2018 in [facility] Office Visit from 09/03/2018 in [facility]  PHQ-2 Total Score  1  2  PHQ-9 Total Score  2  11      Receiving Psychotherapy: No   Treatment Plan/Recommendations: Over 50% of the time is spent in counseling and coordination of care utilizing components of egoanalytic and trauma-focused cognitive behavioral therapeutics to clarify core conflicts, trauma, and loss for restructuring release from angry fixations and self-defeating sin as he realizes he can begin to restore relations, fullness, and forgiveness in order to 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  Optimal time for follow-up 2 to 6 weeks considering possibility of Luvox 25 mg nightly as well as options of formal psychotherapy in this office with Fred May, LPC or Debbie Dowd, LCSW.  eHe declines Luvox or such therapy at this time.   Virtual Visit via Telephone Note  I connected with Gerald Fernandez on 12/03/18 at  1:00 PM EDT by telephone and verified that I am speaking with the correct person using two identifiers.   I discussed the limitations, risks, security and privacy concerns of performing an evaluation and management service by telephone and the availability of in person appointments. I also discussed with the patient that there may be a patient responsible charge related to this service. The patient expressed understanding and agreed to proceed.   History of Present Illness: Traumatic loss of mother followed by extramarital affair traumatic to wife and children for evaluation and management of "am I depressed". He  notes anger over her death but appears to have shrouded his emotional and relational priorities of daily life with the pain of mother's death eminent to being anger turned inward of depression.     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    Follow Up Instructions: Optimal time for follow-up 2 to 6 weeks considering possibility of Luvox 25 mg nightly as well as options of formal psychotherapy in this office with Fred May, LPC or [place], [place].   I discussed the assessment and treatment plan with the patient. The patient was provided an opportunity to ask questions and all were answered. The patient agreed with the plan and demonstrated an understanding of the instructions.   The patient was advised to call back or seek an in-person evaluation if the symptoms worsen or if the condition fails to improve as anticipated.  I provided 50 minutes of non-face-to-face time during this encounter. [place] WebEx meeting #[id number] Password: [email or web address]  Chauncey Mann, MD  Chauncey Mann, MD

## 2018-12-22 ENCOUNTER — Ambulatory Visit (INDEPENDENT_AMBULATORY_CARE_PROVIDER_SITE_OTHER): Payer: PRIVATE HEALTH INSURANCE | Admitting: Orthopedic Surgery

## 2018-12-22 ENCOUNTER — Encounter (INDEPENDENT_AMBULATORY_CARE_PROVIDER_SITE_OTHER): Payer: Self-pay | Admitting: Orthopedic Surgery

## 2018-12-22 ENCOUNTER — Other Ambulatory Visit: Payer: Self-pay

## 2018-12-22 VITALS — Ht 69.0 in | Wt 153.0 lb

## 2018-12-22 DIAGNOSIS — M25872 Other specified joint disorders, left ankle and foot: Secondary | ICD-10-CM

## 2018-12-22 MED ORDER — LIDOCAINE HCL 1 % IJ SOLN
2.0000 mL | INTRAMUSCULAR | Status: AC | PRN
  Administered 2018-12-22: 14:00:00 2 mL

## 2018-12-22 MED ORDER — METHYLPREDNISOLONE ACETATE 40 MG/ML IJ SUSP
40.0000 mg | INTRAMUSCULAR | Status: AC | PRN
  Administered 2018-12-22: 40 mg via INTRA_ARTICULAR

## 2018-12-22 NOTE — Progress Notes (Signed)
Office Visit Note   Patient: Gerald Fernandez           Date of Birth: Sep 06, 1965           MRN: [id number] Visit Date: 12/22/2018              Requested by: Ronnald Nian, MD [address] [place], [place] [address] PCP: Ronnald Nian, MD  Chief Complaint  Patient presents with  . Left Ankle - Follow-up    09/08/18 left ankle scope and debridement       HPI: Patient is a 53 year old gentleman who is about 3 months status post left ankle arthroscopy and debridement.  Patient states he has some persistent swelling and clicking laterally.  Patient states injections have helped in the past.  Assessment & Plan: Visit Diagnoses:  1. Impingement syndrome of left ankle     Plan: Left ankle was injected without complications.  Recommended isometric strengthening of the ankle to improve collagen density.  Follow-Up Instructions: Return if symptoms worsen or fail to improve.   Ortho Exam  Patient is alert, oriented, no adenopathy, well-dressed, normal affect, normal respiratory effort. Examination patient has no redness no cellulitis there is good range of motion of the ankle.  There is no effusion he is tender to palpation anteriorly medially.  Imaging: No results found. No images are attached to the encounter.  Labs: No results found for: HGBA1C, ESRSEDRATE, CRP, LABURIC, REPTSTATUS, GRAMSTAIN, CULT, LABORGA   Lab Results  Component Value Date   ALBUMIN 4.4 09/03/2018   ALBUMIN 3.1 (L) 04/06/2013   ALBUMIN 3.3 (L) 04/05/2013    Body mass index is 22.59 kg/m.  Orders:  No orders of the defined types were placed in this encounter.  No orders of the defined types were placed in this encounter.    Procedures: Medium Joint Inj: L ankle on 12/22/2018 2:02 PM Indications: pain and diagnostic evaluation Details: 22 G 1.5 in needle, anteromedial approach Medications: 2 mL lidocaine 1 %; 40 mg methylPREDNISolone acetate 40 MG/ML Outcome: tolerated well, no  immediate complications Procedure, treatment alternatives, risks and benefits explained, specific risks discussed. Consent was given by the patient. Immediately prior to procedure a time out was called to verify the correct patient, procedure, equipment, support staff and site/side marked as required. Patient was prepped and draped in the usual sterile fashion.      Clinical Data: No additional findings.  ROS:  All other systems negative, except as noted in the HPI. Review of Systems  Objective: Vital Signs: Ht 5\' 9"  (1.753 m)   Wt 153 lb (69.4 kg)   BMI 22.59 kg/m   Specialty Comments:  No specialty comments available.  PMFS History: Patient Active Problem List   Diagnosis Date Noted  . Impingement syndrome of left ankle 03/27/2017  . History of pulmonary embolus (PE) 04/04/2013  . Current smoker 04/04/2013   Past Medical History:  Diagnosis Date  . Allergic rhinitis   . Allergy   . Depression   . Hx of pulmonary embolus   . Impingement syndrome of left ankle   . Smoker 12/02/07  . Tobacco use disorder     Family History  Problem Relation Age of Onset  . COPD Mother   . Hypertension Father     Past Surgical History:  Procedure Laterality Date  . APPENDECTOMY     Social History   Occupational History  . Not on file  Tobacco Use  . Smoking status: Former Smoker  Packs/day: 1.50    Years: 28.00    Pack years: 42.00    Types: Cigarettes    Last attempt to quit: 04/03/2013    Years since quitting: 5.7  . Smokeless tobacco: Former Engineer, waterUser  Substance and Sexual Activity  . Alcohol use: No  . Drug use: No  . Sexual activity: Yes

## 2019-01-04 ENCOUNTER — Ambulatory Visit: Payer: 59 | Admitting: Family Medicine

## 2019-01-06 ENCOUNTER — Ambulatory Visit (INDEPENDENT_AMBULATORY_CARE_PROVIDER_SITE_OTHER): Payer: 59 | Admitting: Psychiatry

## 2019-01-06 ENCOUNTER — Other Ambulatory Visit: Payer: Self-pay

## 2019-01-06 ENCOUNTER — Encounter: Payer: Self-pay | Admitting: Psychiatry

## 2019-01-06 DIAGNOSIS — F439 Reaction to severe stress, unspecified: Secondary | ICD-10-CM | POA: Insufficient documentation

## 2019-01-06 HISTORY — DX: Reaction to severe stress, unspecified: F43.9

## 2019-01-06 NOTE — Progress Notes (Signed)
[facility] Med Check  Patient ID: Gerald Fernandez,  MRN: [id number]  PCP: Ronnald Nian, MD  Date of Evaluation: 01/06/2019 Time spent:10 minutes from 1405 to 1415  Chief Complaint:  Chief Complaint    Trauma; Stress; Depression      HISTORY/CURRENT STATUS: Gerald Fernandez is provided telemedicine audiovisual appointment session, again not turning on his camera due to need to discuss response to past trauma, with consent not collateral for psychiatric interview and exam in 4-week evaluation and management of his question of possible depression attempting to resolve the trauma and stress of Dec 25, 2016 death of mother not able to let go to complete mourning complicated by having months of an extramarital affair now ceased.  Wife answers the phone again but offers no involvement as patient seems to walk outside at his home to talk.  He emphasizes becoming active in the family on yard work and gardening having to tend to a pile of dirt in the yard as rain begins during the session.  He reports good and bad days but overall is coping better staying busy with such work including his job.  He increased his time in the Word such as with [employer] only slightly.  He has resumed cigarette smoking that keeps him busy and gives him energy.  He sees his PCP tomorrow regarding smoking cessation discussed today and is scheduled with a marriage counselor tomorrow for which he prepares today.  Has no suicidality, psychosis, mania, or substance use otherwise.   Individual Medical History/ Review of Systems: Changes? :Yes  [place] registry is clear except some previous analgesic September 08, 2018 likely for his ankle impingement syndrome which been injected in the interim.  Allergies: Penicillins  Current Medications:  Current Outpatient Medications:  .  [employer]S NICOTINE TRANSDERMAL SYS 14 MG/24HR patch, PLACE 1 PATCH ONTO THE SKIN DAILY., Disp: 28 patch, Rfl: 1 .  diclofenac sodium (VOLTAREN) 1 % GEL, Apply 2 g  topically 4 (four) times daily., Disp: 1 Tube, Rfl: 3 .  HYDROcodone-acetaminophen (NORCO/VICODIN) 5-325 MG tablet, Take 1 tablet by mouth every 6 (six) hours as needed for moderate pain., Disp: 30 tablet, Rfl: 0 .  ibuprofen (ADVIL,MOTRIN) 200 MG tablet, Take 600 mg by mouth every 6 (six) hours as needed for pain. , Disp: , Rfl:  .  Multiple Vitamin (MULTIVITAMIN) tablet, Take 1 tablet by mouth daily. , Disp: , Rfl:  .  Rivaroxaban (XARELTO) 20 MG TABS tablet, 1 tablet daily, Disp: , Rfl:    Medication Side Effects: none  Family Medical/ Social History: Changes? Yes, he does not describe any direct apology or reconciliation with his wife, though he has scheduled marital counseling which must partially accomplish that goal as he relinquishes the affair and other residual of the death of his mother.  MENTAL HEALTH EXAM:  There were no vitals taken for this visit.There is no height or weight on file to calculate BMI.  as not present here today.  General Appearance: N/A  Eye Contact:  N/A  Speech:  Blocked, Clear and Coherent, Normal Rate and Talkative  Volume:  Normal  Mood:  Anxious, Dysphoric and Worthless  Affect:  Inappropriate, Full Range and Anxious  Thought Process:  Goal Directed and Linear  Orientation:  Full (Time, Place, and Person)  Thought Content: Rumination and obsession  Suicidal Thoughts:  No  Homicidal Thoughts:  No  Memory:  Immediate;   Good Remote;   Good  Judgement:  Fair  Insight:  Fair  Psychomotor Activity:  Normal, Increased and Mannerisms  Concentration:  Concentration: Fair and Attention Span: Good  Recall:  Fair  Fund of Knowledge: Good  Language: Good  Assets:  Desire for Improvement Leisure Time Talents/Skills  ADL's:  Intact  Cognition: WNL  Prognosis:  Good    DIAGNOSES:    ICD-10-CM   1. Trauma and stressor-related disorder F43.9   2.      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  All target symptoms and matching treatments for goals are updated for continuing smoking cessation efforts with PCP and marital counseling to also address grief and loss of mother the affair.  He declines Luvox recommended today but will make subsequent appointment or recontact me in the office to start such anytime returning as needed according to course of therapies and self help . Virtual Visit via Video Note  I connected with Gerald Fernandez on 01/06/19 at  2:00 PM EDT by a video enabled telemedicine application and verified that I am speaking with the correct person using two identifiers.  Location: Patient:[facility] Provider: Residence walking outside away from wife who answers the phone   I discussed the limitations of evaluation and management by telemedicine and the availability of in person appointments. The patient expressed understanding and agreed to proceed.  History of Present Illness: Evaluation and management readdress his question of possible depression attempting to resolve the trauma and stress of 2018 death of mother not able to let go to complete mourning complicated by having months of an extramarital affair now ceased.     Observations/Objective: Mood:  Anxious, Dysphoric and Worthless  Affect:  Inappropriate, Full Range and Anxious  Thought Process:  Goal Directed and Linear  Orientation:  Full (Time, Place, and Person)  Thought Content: Rumination and obsession   Assessment and Plan: Smoking cessation efforts with PCP and marital counseling will also address grief and loss of mother  the affair as he declines Luvox recommended today.  Follow Up Instructions: He declines Luvox recommended today but will make subsequent appointment or recontact me in the office to start such anytime returning as needed according to course of therapies and self help   I discussed the assessment and treatment plan with the patient. The patient was provided an opportunity to ask questions and all were answered. The patient agreed with the plan and demonstrated an understanding of the instructions.   The patient was advised to call back or seek an in-person evaluation if the symptoms worsen or if the condition fails to improve as anticipated.  I provided 10 minutes of non-face-to-face time during this encounter.   Chauncey MannGlenn E Jennings, MD   Chauncey MannGlenn E Jennings, MD

## 2019-01-07 ENCOUNTER — Other Ambulatory Visit: Payer: Self-pay

## 2019-01-07 ENCOUNTER — Encounter: Payer: Self-pay | Admitting: Family Medicine

## 2019-01-07 ENCOUNTER — Ambulatory Visit (INDEPENDENT_AMBULATORY_CARE_PROVIDER_SITE_OTHER): Payer: 59 | Admitting: Family Medicine

## 2019-01-07 VITALS — Temp 98.6°F | Wt 141.0 lb

## 2019-01-07 DIAGNOSIS — F439 Reaction to severe stress, unspecified: Secondary | ICD-10-CM | POA: Diagnosis not present

## 2019-01-07 NOTE — Progress Notes (Signed)
   Subjective:    Patient ID: Gerald Fernandez, male    DOB: June 13, 1966, 53 y.o.   MRN: [id number]  HPI Documentation for virtual telephone encounter. Documentation for virtual audio and video telecommunications through [employer] encounter: The patient was located at home. The provider was located in the office. The patient did consent to this visit and is aware of possible charges through their insurance for this visit. The other persons participating in this telemedicine service were none. Time spent on call was 5 minutes and in review of previous records >30 minutes total. This virtual service is not related to other E/M service within previous 7 days. Today's visit is for possible counseling for smoking cessation.  Since last being seen he has been to see Dr. Beverly Milch virtually twice.  That record was reviewed.  He also has been involved in marriage counseling and has been there twice.  He is now living with his wife and working to multiple issues including his relationship with his wife and the fact that his mother died 2 years ago.  He had a very strong relationship with her, talking her on a daily basis.  He is starting to gain insight into the relationship that he had with his mother and how it has affected him and also his marriage.  He also had an extramarital affair which obviously complicated the issue.  He and his wife at this point are at different points in terms of learning to think he has each other and themselves.  He has an appointment with his marriage counselor later this afternoon.  We also discussed the possible use of medication.  At this point he is not interested in necessarily being placed on any medication.  He has no suicidal ideation.     Review of Systems     Objective:   Physical Exam Alert and in no distress with appropriate affect       Assessment & Plan:  Trauma and stressor-related disorder He will continue in marriage counseling.  Also suggested  possibly individual psychotherapy to help on an individual basis.  Discussed the fact that if we want to pursue smoking cessation later I would be willing to do it but at this point do not think it is appropriate.  I explained that he had enough stressors in his life that we did not need ading smoking cessation to this.  Did say that if he pursue this, I would consider using Wellbutrin to help both psychologically and with smoking.  He was comfortable with this.  Over 30 minutes, the entire time spent in counseling.  At this point I will be available to him on an as-needed basis.  He was comfortable with that.

## 2019-01-29 ENCOUNTER — Telehealth: Payer: Self-pay | Admitting: Family Medicine

## 2019-01-29 NOTE — Telephone Encounter (Signed)
   Patient's wife called, states that patient is ready to try medication  Do you need to meet with him again or can you just prescribe  Please call     [facility] [address]

## 2019-02-10 ENCOUNTER — Ambulatory Visit: Payer: 59 | Admitting: Orthopedic Surgery

## 2019-02-11 ENCOUNTER — Ambulatory Visit (INDEPENDENT_AMBULATORY_CARE_PROVIDER_SITE_OTHER): Payer: 59 | Admitting: Orthopedic Surgery

## 2019-02-11 ENCOUNTER — Encounter: Payer: Self-pay | Admitting: Orthopedic Surgery

## 2019-02-11 ENCOUNTER — Other Ambulatory Visit: Payer: Self-pay

## 2019-02-11 VITALS — Ht 69.0 in | Wt 141.0 lb

## 2019-02-11 DIAGNOSIS — M19172 Post-traumatic osteoarthritis, left ankle and foot: Secondary | ICD-10-CM | POA: Diagnosis not present

## 2019-02-11 NOTE — Progress Notes (Signed)
Office Visit Note   Patient: Gerald Fernandez           Date of Birth: 03/16/1966           MRN: [id number] Visit Date: 02/11/2019              Requested by: Ronnald NianLalonde, John C, MD [address] [place] [id number] PCP: Ronnald NianLalonde, John C, MD  Chief Complaint  Patient presents with  . Left Ankle - Routine Post Op    09/08/18 left ankle scope & deb      HPI: Patient is a 53 year old gentleman who is a high demand person with activities who is status post left ankle arthroscopy and debridement for traumatic arthritis back in January.  Patient developed the traumatic arthritis from an ankle fracture.  Patient initially had excellent relief from the arthroscopy but has had persistent pain that is been worse over the past 2 weeks.  Patient states he cannot perform activities of daily living due to his increased pain.  Patient is wondering if he could proceed with a below the knee amputation to resume activities and eliminate his pain.  Assessment & Plan: Visit Diagnoses:  1. Post-traumatic arthritis of left ankle     Plan: Discussed with the patient that I feel he is too high demand person for a total ankle arthroplasty do not feel that a transtibial amputation is recommended at this time feel that we can get him good results with a mini open fusion with a small anterior plate and 2 cannulated screws.  Risks and benefits were discussed including delayed healing infection need for additional surgery.  Patient states he understands would like to proceed with an ankle fusion as soon as possible.  Follow-Up Instructions: No follow-ups on file.   Ortho Exam  Patient is alert, oriented, no adenopathy, well-dressed, normal affect, normal respiratory effort. Examination patient has good hair growth to the foot he has good pulses.  He has valgus alignment to the hindfoot.  Radiographs shows valgus alignment of the talus within the plafond.  There is bone-on-bone contact of the lateral joint  line and there are large osteophytic bone spurs anteriorly and posteriorly.  Patient does have some subtalar arthritis as well but he has good subtalar forefoot and midfoot range of motion and this is not painful.  Patient has good bone quality no large cysts.  Imaging: No results found. No images are attached to the encounter.  Labs: No results found for: HGBA1C, ESRSEDRATE, CRP, LABURIC, REPTSTATUS, GRAMSTAIN, CULT, LABORGA   Lab Results  Component Value Date   ALBUMIN 4.4 09/03/2018   ALBUMIN 3.1 (L) 04/06/2013   ALBUMIN 3.3 (L) 04/05/2013    Body mass index is 20.82 kg/m.  Orders:  No orders of the defined types were placed in this encounter.  No orders of the defined types were placed in this encounter.    Procedures: No procedures performed  Clinical Data: No additional findings.  ROS:  All other systems negative, except as noted in the HPI. Review of Systems  Objective: Vital Signs: Ht 5\' 9"  (1.753 m)   Wt 141 lb (64 kg)   BMI 20.82 kg/m   Specialty Comments:  No specialty comments available.  PMFS History: Patient Active Problem List   Diagnosis Date Noted  . 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 Impingement syndrome of left ankle 03/27/2017  . History of pulmonary embolus (PE) 04/04/2013  . Current smoker 04/04/2013   Past Medical History:  Diagnosis Date  . Allergic rhinitis   . Allergy   . Depression   . Hx of pulmonary embolus   . Impingement syndrome of left ankle   . Smoker 12/02/07  . Tobacco use disorder   . 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 COPD Mother   . Hypertension Father     Past Surgical History:  Procedure Laterality Date  . APPENDECTOMY     Social History   Occupational History  . Not on file  Tobacco Use  . Smoking status: Current Every Day Smoker    Packs/day: 1.50    Years: 28.00    Pack years: 42.00    Types: Cigarettes    Last  attempt to quit: 04/03/2013    Years since quitting: 5.8  . Smokeless tobacco: Former Network engineer and Sexual Activity  . Alcohol use: No  . Drug use: No  . Sexual activity: Yes

## 2019-02-15 ENCOUNTER — Ambulatory Visit (INDEPENDENT_AMBULATORY_CARE_PROVIDER_SITE_OTHER): Payer: 59 | Admitting: Family Medicine

## 2019-02-15 ENCOUNTER — Other Ambulatory Visit: Payer: Self-pay

## 2019-02-15 ENCOUNTER — Encounter: Payer: Self-pay | Admitting: Family Medicine

## 2019-02-15 VITALS — Temp 98.7°F | Ht 70.0 in | Wt 140.2 lb

## 2019-02-15 DIAGNOSIS — F341 Dysthymic disorder: Secondary | ICD-10-CM | POA: Diagnosis not present

## 2019-02-15 DIAGNOSIS — F17200 Nicotine dependence, unspecified, uncomplicated: Secondary | ICD-10-CM

## 2019-02-15 MED ORDER — BUPROPION HCL ER (SR) 150 MG PO TB12: 150.0000 mg | ORAL_TABLET | Freq: Two times a day (BID) | ORAL | 1 refills | Status: DC

## 2019-02-15 NOTE — Progress Notes (Signed)
   Subjective:    Patient ID: Gerald Fernandez, male    DOB: [birth year], 54 y.o.   MRN: [id number]  HPI Documentation for virtual telephone encounter. Documentation for virtual audio and video telecommunications through [facility] encounter: The patient was located at home. The provider was located in the office. The patient did consent to this visit and is aware of possible charges through their insurance for this visit. The other persons participating in this telemedicine service were none. Time spent on call was 5 minutes and in review of previous records >35 minutes total. This virtual service is not related to other E/M service within previous 7 days. He is scheduled for surgery on his ankle and would like to quit smoking.  He is also dealing with being involved in marriage counseling, reconnecting with his children and breaking off another relationship.  He is seeing a marriage counselor individually and in conjunction with his wife.  He has made some progress in getting this resolved but still has questions concerning the relationship and the fact that he was seeking comfort and love outside of the relationship.    Review of Systems     Objective:   Physical Exam Alert and in no distress with appropriate affect      Assessment & Plan:  Dysthymia - Plan: buPROPion (WELLBUTRIN SR) 150 MG 12 hr tablet, .  Current smoker - Plan: . I discussed his counseling with him in great detail.  He is having a hard time resolving what he thinks he should do versus what his emotions tell him he needs to be doing.  Discussed the fact that the counselor will continue to work on him individually and the marriage secondarily.  He appreciated that.  There was a lot of hesitation in his voice concerning what the appropriate thing to do is.  He will continue in counseling to help resolve the internal issues and secondarily the marriage. I will place him on Wellbutrin.  He is to start taking 1 pill/day for 3  or 4 days in the morning.  He will call me if he has difficulties.  I explained that this could also help with smoking cessation. He is to switch to vaping instead of cigarettes in order to satisfy the nicotine issue.  Set up to be seen in 1 month. Over 35 minutes, the entire time spent in counseling.

## 2019-02-20 ENCOUNTER — Other Ambulatory Visit (HOSPITAL_COMMUNITY)
Admission: RE | Admit: 2019-02-20 | Discharge: 2019-02-20 | Disposition: A | Discharge status: Home / Self Care | Payer: 59 | Source: Ambulatory Visit | Attending: Orthopedic Surgery | Admitting: Orthopedic Surgery

## 2019-02-20 DIAGNOSIS — Z1159 Encounter for screening for other viral diseases: Secondary | ICD-10-CM | POA: Insufficient documentation

## 2019-02-21 LAB — SARS CORONAVIRUS 2 (TAT 6-24 HRS): SARS Coronavirus 2: NEGATIVE

## 2019-02-22 ENCOUNTER — Telehealth: Payer: Self-pay | Admitting: Orthopedic Surgery

## 2019-02-22 ENCOUNTER — Other Ambulatory Visit: Payer: Self-pay

## 2019-02-22 NOTE — Telephone Encounter (Signed)
Pt would like to be contacted at [phone]

## 2019-02-22 NOTE — Telephone Encounter (Signed)
I called and sw pt to advise that the letter is ready. He is going to call his employer to obtain fax number so this can be faxed infor him. I will hold message pending return call.

## 2019-02-22 NOTE — Telephone Encounter (Signed)
Patient called advised he had the Covid-19 test and will be in quarantine until his surgery 02/24/2019. Patient asked if he can get a note for his employer for the next 2 days. The number to contact patient is [phone] or [phone]

## 2019-02-23 ENCOUNTER — Telehealth: Payer: Self-pay | Admitting: Orthopedic Surgery

## 2019-02-23 ENCOUNTER — Other Ambulatory Visit: Payer: Self-pay

## 2019-02-23 ENCOUNTER — Encounter (HOSPITAL_COMMUNITY): Payer: Self-pay | Admitting: *Deleted

## 2019-02-23 NOTE — Telephone Encounter (Signed)
[employer] was called and informed about patient's date of leave from work, his status of restrictions and estimated to RTW 6 weeks after surgery. His surgery diagnosis and treatment for this claim.  Questions were answered and understood verbally with representative.

## 2019-02-23 NOTE — Anesthesia Preprocedure Evaluation (Addendum)
Anesthesia Evaluation  Patient identified by MRN, date of birth, ID band Patient awake    Reviewed: Allergy & Precautions, NPO status , Patient's Chart, lab work & pertinent test results  History of Anesthesia Complications (+) PONV and history of anesthetic complications  Airway Mallampati: I  TM Distance: >3 FB Neck ROM: Full    Dental no notable dental hx. (+) Teeth Intact, Dental Advisory Given   Pulmonary Current Smoker, PE   Pulmonary exam normal breath sounds clear to auscultation       Cardiovascular + DVT  Normal cardiovascular exam Rhythm:Regular Rate:Normal  TTE 2015 EF 50-55%, mild MR   Neuro/Psych PSYCHIATRIC DISORDERS Anxiety Depression negative neurological ROS     GI/Hepatic negative GI ROS, Neg liver ROS,   Endo/Other  negative endocrine ROS  Renal/GU negative Renal ROS  negative genitourinary   Musculoskeletal  (+) Arthritis ,   Abdominal   Peds  Hematology negative hematology ROS (+)   Anesthesia Other Findings   Reproductive/Obstetrics                            Anesthesia Physical Anesthesia Plan  ASA: II  Anesthesia Plan: General and Regional   Post-op Pain Management:  Regional for Post-op pain   Induction: Intravenous  PONV Risk Score and Plan: 2 and Midazolam, Ondansetron and Dexamethasone  Airway Management Planned: LMA  Additional Equipment:   Intra-op Plan:   Post-operative Plan: Extubation in OR  Informed Consent: I have reviewed the patients History and Physical, chart, labs and discussed the procedure including the risks, benefits and alternatives for the proposed anesthesia with the patient or authorized representative who has indicated his/her understanding and acceptance.     Dental advisory given  Plan Discussed with: CRNA  Anesthesia Plan Comments:         Anesthesia Quick Evaluation

## 2019-02-23 NOTE — Telephone Encounter (Signed)
Patient was called and stated that he picked up his form yesterday 02/22/19 and faxed the note off himself to his employer.

## 2019-02-23 NOTE — Telephone Encounter (Signed)
Gerald Fernandez from [employer] called needing information concerning office visit 02/11/2019    Asked what was patient seen for? The number to contact Gerald Fernandez is [phone]    Claim# [id number]

## 2019-02-23 NOTE — Progress Notes (Signed)
Gerald Fernandez denies chest pain or shortness of breath. Gerald Fernandez had COVID Test 02/20/2019, he has been in [place] at home with his wife since that time.  I instructed patient to not eat after midnight, but he could drink clear liquids until 4:30 am. We reviewed what clear liquids are, patient  Replied that he drinks water.  ( patient was unable to pick up Ensure Pre Surgery, because I called him at 1845.

## 2019-02-24 ENCOUNTER — Ambulatory Visit (HOSPITAL_COMMUNITY): Payer: 59 | Admitting: Anesthesiology

## 2019-02-24 ENCOUNTER — Encounter (HOSPITAL_COMMUNITY): Payer: Self-pay

## 2019-02-24 ENCOUNTER — Other Ambulatory Visit: Payer: Self-pay

## 2019-02-24 ENCOUNTER — Encounter (HOSPITAL_COMMUNITY)
Admission: RE | Disposition: A | Discharge status: Home / Self Care | Payer: Self-pay | Source: Home / Self Care | Attending: Orthopedic Surgery

## 2019-02-24 ENCOUNTER — Observation Stay (HOSPITAL_COMMUNITY)
Admission: RE | Admit: 2019-02-24 | Discharge: 2019-02-25 | Disposition: A | Discharge status: Home / Self Care | Payer: 59 | Attending: Orthopedic Surgery | Admitting: Orthopedic Surgery

## 2019-02-24 DIAGNOSIS — Z86711 Personal history of pulmonary embolism: Secondary | ICD-10-CM | POA: Insufficient documentation

## 2019-02-24 DIAGNOSIS — M19172 Post-traumatic osteoarthritis, left ankle and foot: Secondary | ICD-10-CM

## 2019-02-24 DIAGNOSIS — F329 Major depressive disorder, single episode, unspecified: Secondary | ICD-10-CM | POA: Insufficient documentation

## 2019-02-24 DIAGNOSIS — Z86718 Personal history of other venous thrombosis and embolism: Secondary | ICD-10-CM | POA: Diagnosis not present

## 2019-02-24 DIAGNOSIS — F17210 Nicotine dependence, cigarettes, uncomplicated: Secondary | ICD-10-CM | POA: Diagnosis not present

## 2019-02-24 DIAGNOSIS — M19072 Primary osteoarthritis, left ankle and foot: Secondary | ICD-10-CM | POA: Diagnosis not present

## 2019-02-24 DIAGNOSIS — M25572 Pain in left ankle and joints of left foot: Secondary | ICD-10-CM | POA: Diagnosis present

## 2019-02-24 DIAGNOSIS — Z981 Arthrodesis status: Secondary | ICD-10-CM

## 2019-02-24 HISTORY — PX: ANKLE FUSION: SHX5718

## 2019-02-24 HISTORY — DX: Other specified postprocedural states: Z98.890

## 2019-02-24 HISTORY — DX: Other complications of anesthesia, initial encounter: T88.59XA

## 2019-02-24 HISTORY — DX: Unspecified osteoarthritis, unspecified site: M19.90

## 2019-02-24 HISTORY — DX: Nausea with vomiting, unspecified: R11.2

## 2019-02-24 LAB — BASIC METABOLIC PANEL
Anion gap: 9 (ref 5–15)
BUN: 15 mg/dL (ref 6–20)
CO2: 26 mmol/L (ref 22–32)
Calcium: 8.5 mg/dL — ABNORMAL LOW (ref 8.9–10.3)
Chloride: 105 mmol/L (ref 98–111)
Creatinine, Ser: 0.91 mg/dL (ref 0.61–1.24)
GFR calc Af Amer: >60 mL/min (ref >60)
GFR calc non Af Amer: >60 mL/min (ref >60)
Glucose, Bld: 91 mg/dL (ref 70–99)
Potassium: 3.6 mmol/L (ref 3.5–5.1)
Sodium: 140 mmol/L (ref 135–145)

## 2019-02-24 LAB — CBC
HCT: 45.6 % (ref 39.0–52.0)
Hemoglobin: 15.3 g/dL (ref 13.0–17.0)
MCH: 32 pg (ref 26.0–34.0)
MCHC: 33.6 g/dL (ref 30.0–36.0)
MCV: 95.4 fL (ref 80.0–100.0)
Platelets: 198 10*3/uL (ref 150–400)
RBC: 4.78 MIL/uL (ref 4.22–5.81)
RDW: 12 % (ref 11.5–15.5)
WBC: 9.9 10*3/uL (ref 4.0–10.5)
nRBC: 0 % (ref 0.0–0.2)

## 2019-02-24 LAB — SURGICAL PCR SCREEN
MRSA, PCR: NEGATIVE
Staphylococcus aureus: NEGATIVE

## 2019-02-24 SURGERY — ANKLE FUSION
Anesthesia: Regional | Site: Ankle | Laterality: Left

## 2019-02-24 MED ORDER — ASPIRIN EC 325 MG PO TBEC
325.0000 mg | DELAYED_RELEASE_TABLET | Freq: Every day | ORAL | Status: DC
  Administered 2019-02-25: 325 mg via ORAL
  Filled 2019-02-24: qty 1

## 2019-02-24 MED ORDER — BUPROPION HCL ER (SR) 150 MG PO TB12
150.0000 mg | ORAL_TABLET | Freq: Every day | ORAL | Status: DC
  Administered 2019-02-24 – 2019-02-25 (×2): 150 mg via ORAL
  Filled 2019-02-24 (×2): qty 1

## 2019-02-24 MED ORDER — POVIDONE-IODINE 10 % EX SWAB
2.0000 "application " | Freq: Once | CUTANEOUS | Status: AC
  Administered 2019-02-24: 2 via TOPICAL

## 2019-02-24 MED ORDER — ONDANSETRON HCL 4 MG PO TABS: 4.0000 mg | ORAL_TABLET | Freq: Four times a day (QID) | ORAL | Status: DC | PRN

## 2019-02-24 MED ORDER — LACTATED RINGERS IV SOLN
INTRAVENOUS | Status: DC | PRN
  Administered 2019-02-24: 07:00:00 via INTRAVENOUS

## 2019-02-24 MED ORDER — EPHEDRINE 5 MG/ML INJ
INTRAVENOUS | Status: AC
  Filled 2019-02-24: qty 10

## 2019-02-24 MED ORDER — FENTANYL CITRATE (PF) 250 MCG/5ML IJ SOLN
INTRAMUSCULAR | Status: AC
  Filled 2019-02-24: qty 5

## 2019-02-24 MED ORDER — VARENICLINE TARTRATE 0.5 MG PO TABS
0.5000 mg | ORAL_TABLET | Freq: Two times a day (BID) | ORAL | Status: DC
  Administered 2019-02-24 – 2019-02-25 (×2): 0.5 mg via ORAL
  Filled 2019-02-24 (×3): qty 1

## 2019-02-24 MED ORDER — CLINDAMYCIN PHOSPHATE 600 MG/50ML IV SOLN
600.0000 mg | Freq: Four times a day (QID) | INTRAVENOUS | Status: AC
  Administered 2019-02-24 – 2019-02-25 (×3): 600 mg via INTRAVENOUS
  Filled 2019-02-24 (×3): qty 50

## 2019-02-24 MED ORDER — MAGNESIUM CITRATE PO SOLN: 1.0000 | Freq: Once | ORAL | Status: DC | PRN

## 2019-02-24 MED ORDER — BISACODYL 10 MG RE SUPP: 10.0000 mg | Freq: Every day | RECTAL | Status: DC | PRN

## 2019-02-24 MED ORDER — SCOPOLAMINE 1 MG/3DAYS TD PT72
MEDICATED_PATCH | TRANSDERMAL | Status: DC | PRN
  Administered 2019-02-24: 1 via TRANSDERMAL

## 2019-02-24 MED ORDER — MIDAZOLAM HCL 5 MG/5ML IJ SOLN
INTRAMUSCULAR | Status: DC | PRN
  Administered 2019-02-24: 2 mg via INTRAVENOUS

## 2019-02-24 MED ORDER — METOCLOPRAMIDE HCL 5 MG PO TABS: 5.0000 mg | ORAL_TABLET | Freq: Three times a day (TID) | ORAL | Status: DC | PRN

## 2019-02-24 MED ORDER — ONDANSETRON HCL 4 MG/2ML IJ SOLN: 4.0000 mg | Freq: Four times a day (QID) | INTRAMUSCULAR | Status: DC | PRN

## 2019-02-24 MED ORDER — ROPIVACAINE HCL 5 MG/ML IJ SOLN
INTRAMUSCULAR | Status: DC | PRN
  Administered 2019-02-24: 15 mL via PERINEURAL
  Administered 2019-02-24: 25 mL via PERINEURAL

## 2019-02-24 MED ORDER — CLONIDINE HCL (ANALGESIA) 100 MCG/ML EP SOLN
EPIDURAL | Status: DC | PRN
  Administered 2019-02-24 (×2): 50 ug

## 2019-02-24 MED ORDER — ONDANSETRON HCL 4 MG/2ML IJ SOLN
INTRAMUSCULAR | Status: DC | PRN
  Administered 2019-02-24: 4 mg via INTRAVENOUS

## 2019-02-24 MED ORDER — OXYCODONE HCL 5 MG PO TABS: 10.0000 mg | ORAL_TABLET | ORAL | Status: DC | PRN

## 2019-02-24 MED ORDER — LIDOCAINE 2% (20 MG/ML) 5 ML SYRINGE
INTRAMUSCULAR | Status: DC | PRN
  Administered 2019-02-24: 20 mg via INTRAVENOUS

## 2019-02-24 MED ORDER — FENTANYL CITRATE (PF) 100 MCG/2ML IJ SOLN: 25.0000 ug | INTRAMUSCULAR | Status: DC | PRN

## 2019-02-24 MED ORDER — 0.9 % SODIUM CHLORIDE (POUR BTL) OPTIME
TOPICAL | Status: DC | PRN
  Administered 2019-02-24: 1000 mL

## 2019-02-24 MED ORDER — OXYCODONE HCL 5 MG PO TABS
5.0000 mg | ORAL_TABLET | ORAL | Status: DC | PRN
  Administered 2019-02-24 – 2019-02-25 (×3): 10 mg via ORAL
  Filled 2019-02-24 (×3): qty 2

## 2019-02-24 MED ORDER — PROPOFOL 10 MG/ML IV BOLUS
INTRAVENOUS | Status: AC
  Filled 2019-02-24: qty 20

## 2019-02-24 MED ORDER — METHOCARBAMOL 500 MG PO TABS
500.0000 mg | ORAL_TABLET | Freq: Four times a day (QID) | ORAL | Status: DC | PRN
  Administered 2019-02-24 – 2019-02-25 (×2): 500 mg via ORAL
  Filled 2019-02-24 (×2): qty 1

## 2019-02-24 MED ORDER — SODIUM CHLORIDE 0.9 % IV SOLN
INTRAVENOUS | Status: DC
  Administered 2019-02-24: 12:00:00 via INTRAVENOUS

## 2019-02-24 MED ORDER — SCOPOLAMINE 1 MG/3DAYS TD PT72
MEDICATED_PATCH | TRANSDERMAL | Status: AC
  Filled 2019-02-24: qty 1

## 2019-02-24 MED ORDER — METOCLOPRAMIDE HCL 5 MG/ML IJ SOLN: 5.0000 mg | Freq: Three times a day (TID) | INTRAMUSCULAR | Status: DC | PRN

## 2019-02-24 MED ORDER — CHLORHEXIDINE GLUCONATE 4 % EX LIQD: 60.0000 mL | Freq: Once | CUTANEOUS | Status: DC

## 2019-02-24 MED ORDER — FENTANYL CITRATE (PF) 100 MCG/2ML IJ SOLN
INTRAMUSCULAR | Status: DC | PRN
  Administered 2019-02-24 (×2): 50 ug via INTRAVENOUS

## 2019-02-24 MED ORDER — ENSURE PRE-SURGERY PO LIQD
296.0000 mL | Freq: Once | ORAL | Status: DC
  Filled 2019-02-24: qty 296

## 2019-02-24 MED ORDER — METHOCARBAMOL 1000 MG/10ML IJ SOLN
500.0000 mg | Freq: Four times a day (QID) | INTRAVENOUS | Status: DC | PRN
  Filled 2019-02-24: qty 5

## 2019-02-24 MED ORDER — MIDAZOLAM HCL 2 MG/2ML IJ SOLN
INTRAMUSCULAR | Status: AC
  Filled 2019-02-24: qty 2

## 2019-02-24 MED ORDER — DOCUSATE SODIUM 100 MG PO CAPS
100.0000 mg | ORAL_CAPSULE | Freq: Two times a day (BID) | ORAL | Status: DC
  Administered 2019-02-24 – 2019-02-25 (×3): 100 mg via ORAL
  Filled 2019-02-24 (×3): qty 1

## 2019-02-24 MED ORDER — CLINDAMYCIN PHOSPHATE 900 MG/50ML IV SOLN
900.0000 mg | INTRAVENOUS | Status: AC
  Administered 2019-02-24: 08:00:00 900 mg via INTRAVENOUS
  Filled 2019-02-24: qty 50

## 2019-02-24 MED ORDER — DEXAMETHASONE SODIUM PHOSPHATE 10 MG/ML IJ SOLN
INTRAMUSCULAR | Status: DC | PRN
  Administered 2019-02-24: 5 mg via INTRAVENOUS

## 2019-02-24 MED ORDER — ACETAMINOPHEN 325 MG PO TABS: 325.0000 mg | ORAL_TABLET | Freq: Four times a day (QID) | ORAL | Status: DC | PRN

## 2019-02-24 MED ORDER — LIDOCAINE 2% (20 MG/ML) 5 ML SYRINGE
INTRAMUSCULAR | Status: AC
  Filled 2019-02-24: qty 5

## 2019-02-24 MED ORDER — POLYETHYLENE GLYCOL 3350 17 G PO PACK: 17.0000 g | PACK | Freq: Every day | ORAL | Status: DC | PRN

## 2019-02-24 MED ORDER — HYDROMORPHONE HCL 1 MG/ML IJ SOLN
0.5000 mg | INTRAMUSCULAR | Status: DC | PRN
  Administered 2019-02-25: 1 mg via INTRAVENOUS
  Filled 2019-02-24: qty 1

## 2019-02-24 MED ORDER — ACETAMINOPHEN 500 MG PO TABS
1000.0000 mg | ORAL_TABLET | Freq: Once | ORAL | Status: AC
  Administered 2019-02-24: 06:00:00 1000 mg via ORAL
  Filled 2019-02-24: qty 2

## 2019-02-24 MED ORDER — EPHEDRINE SULFATE-NACL 50-0.9 MG/10ML-% IV SOSY
PREFILLED_SYRINGE | INTRAVENOUS | Status: DC | PRN
  Administered 2019-02-24: 5 mg via INTRAVENOUS
  Administered 2019-02-24: 10 mg via INTRAVENOUS
  Administered 2019-02-24: 5 mg via INTRAVENOUS

## 2019-02-24 MED ORDER — PROPOFOL 10 MG/ML IV BOLUS
INTRAVENOUS | Status: DC | PRN
  Administered 2019-02-24: 150 mg via INTRAVENOUS

## 2019-02-24 MED ORDER — SODIUM CHLORIDE 0.9 % IV SOLN
INTRAVENOUS | Status: DC | PRN
  Administered 2019-02-24: 08:00:00 25 ug/min via INTRAVENOUS

## 2019-02-24 SURGICAL SUPPLY — 46 items
BANDAGE ESMARK 6X9 LF (GAUZE/BANDAGES/DRESSINGS) ×1 IMPLANT
BIT DRILL CANN LNG FLUTE 3.0 (BIT) IMPLANT
BIT DRILL CANN LONG 5.5 (BIT) ×1 IMPLANT
BIT DRILL CANNULATED 3.0 (BIT) ×2
BLADE AVERAGE 25X9 (BLADE) ×2 IMPLANT
BNDG CMPR 9X6 STRL LF SNTH (GAUZE/BANDAGES/DRESSINGS) ×1
BNDG COHESIVE 4X5 TAN STRL (GAUZE/BANDAGES/DRESSINGS) ×2 IMPLANT
BNDG ESMARK 6X9 LF (GAUZE/BANDAGES/DRESSINGS) ×2
BNDG GAUZE ELAST 4 BULKY (GAUZE/BANDAGES/DRESSINGS) ×3 IMPLANT
COVER SURGICAL LIGHT HANDLE (MISCELLANEOUS) ×3 IMPLANT
COVER WAND RF STERILE (DRAPES) ×2 IMPLANT
DRAPE OEC MINIVIEW 54X84 (DRAPES) ×1 IMPLANT
DRAPE U-SHAPE 47X51 STRL (DRAPES) ×2 IMPLANT
DRSG ADAPTIC 3X8 NADH LF (GAUZE/BANDAGES/DRESSINGS) ×1 IMPLANT
DURAPREP 26ML APPLICATOR (WOUND CARE) ×2 IMPLANT
ELECT REM PT RETURN 9FT ADLT (ELECTROSURGICAL) ×2
ELECTRODE REM PT RTRN 9FT ADLT (ELECTROSURGICAL) ×1 IMPLANT
GAUZE SPONGE 4X4 12PLY STRL (GAUZE/BANDAGES/DRESSINGS) ×2 IMPLANT
GLOVE BIOGEL PI IND STRL 9 (GLOVE) ×1 IMPLANT
GLOVE BIOGEL PI INDICATOR 9 (GLOVE) ×1
GLOVE SURG ORTHO 9.0 STRL STRW (GLOVE) ×2 IMPLANT
GOWN STRL REUS W/ TWL LRG LVL3 (GOWN DISPOSABLE) ×1 IMPLANT
GOWN STRL REUS W/ TWL XL LVL3 (GOWN DISPOSABLE) ×1 IMPLANT
GOWN STRL REUS W/TWL LRG LVL3 (GOWN DISPOSABLE) ×2
GOWN STRL REUS W/TWL XL LVL3 (GOWN DISPOSABLE) ×2
KIT BASIN OR (CUSTOM PROCEDURE TRAY) ×2 IMPLANT
KIT TURNOVER KIT B (KITS) ×2 IMPLANT
NS IRRIG 1000ML POUR BTL (IV SOLUTION) ×2 IMPLANT
PACK ORTHO EXTREMITY (CUSTOM PROCEDURE TRAY) ×2 IMPLANT
PAD ABD 8X10 STRL (GAUZE/BANDAGES/DRESSINGS) ×1 IMPLANT
PAD ARMBOARD 7.5X6 YLW CONV (MISCELLANEOUS) ×4 IMPLANT
PLATE ANT ANKLE TT LT 4H (Plate) ×1 IMPLANT
PUTTY DBM ALLOSYNC PURE 10CC (Putty) ×1 IMPLANT
SCREW BONE TI LP 4.5X30 (Screw) ×1 IMPLANT
SCREW LOW PROFILE 4.5X28 (Screw) ×2 IMPLANT
SCREW LOW PROFILE 4.5X32 (Screw) ×1 IMPLANT
SCREW LP TI 5.5X45 (Screw) ×1 IMPLANT
SCREW LP TI 5.5X60 (Screw) ×1 IMPLANT
SPONGE LAP 18X18 RF (DISPOSABLE) ×2 IMPLANT
SUCTION FRAZIER HANDLE 10FR (MISCELLANEOUS) ×1
SUCTION TUBE FRAZIER 10FR DISP (MISCELLANEOUS) ×1 IMPLANT
SUT ETHILON 2 0 PSLX (SUTURE) ×6 IMPLANT
TOWEL GREEN STERILE FF (TOWEL DISPOSABLE) ×2 IMPLANT
TOWEL OR 17X26 10 PK STRL BLUE (TOWEL DISPOSABLE) ×2 IMPLANT
TUBE CONNECTING 12X1/4 (SUCTIONS) ×2 IMPLANT
WATER STERILE IRR 1000ML POUR (IV SOLUTION) ×2 IMPLANT

## 2019-02-24 NOTE — Transfer of Care (Signed)
Immediate Anesthesia Transfer of Care Note  Patient: Gerald Fernandez  Procedure(s) Performed: LEFT OPEN ANKLE FUSION (Left Ankle)  Patient Location: PACU  Anesthesia Type:GA combined with regional for post-op pain  Level of Consciousness: awake, alert  and oriented  Airway & Oxygen Therapy: Patient Spontanous Breathing and Patient connected to nasal cannula oxygen  Post-op Assessment: Report given to RN, Post -op Vital signs reviewed and stable and Patient moving all extremities  Post vital signs: Reviewed and stable  Last Vitals:  Vitals Value Taken Time  BP 134/81 02/24/19 0900  Temp 36.2 C 02/24/19 0900  Pulse 61 02/24/19 0900  Resp 15 02/24/19 0900  SpO2 100 % 02/24/19 0900  Vitals shown include unvalidated device data.  Last Pain:  Vitals:   02/24/19 0617  TempSrc:   PainSc: 5       Patients Stated Pain Goal: 3 (21/30/86 5784)  Complications: No apparent anesthesia complications

## 2019-02-24 NOTE — Anesthesia Postprocedure Evaluation (Signed)
Anesthesia Post Note  Patient: Gerald Fernandez  Procedure(s) Performed: LEFT OPEN ANKLE FUSION (Left Ankle)     Patient location during evaluation: PACU Anesthesia Type: Regional and General Level of consciousness: awake and alert Pain management: pain level controlled Vital Signs Assessment: post-procedure vital signs reviewed and stable Respiratory status: spontaneous breathing, nonlabored ventilation, respiratory function stable and patient connected to nasal cannula oxygen Cardiovascular status: blood pressure returned to baseline and stable Postop Assessment: no apparent nausea or vomiting Anesthetic complications: no    Last Vitals:  Vitals:   02/24/19 0945 02/24/19 1005  BP: 125/78 110/69  Pulse: (!) 53 (!) 53  Resp: 12 17  Temp:    SpO2: 100% 100%    Last Pain:  Vitals:   02/24/19 1005  TempSrc:   PainSc: 1                  Asa Fath L Ishi Danser

## 2019-02-24 NOTE — Evaluation (Signed)
Physical Therapy Evaluation & Discharge Patient Details Name: Gerald Fernandez MRN: [id number] DOB: [birth year] Today's Date: 02/24/2019   History of Present Illness  Pt is a 53 y.o. male with post-traumatic ankle arthritis (initial L ankle fx ~7 years ago), now admitted for L ankle fusion 02/24/19. PMH includes DVT/PE, L ankle arthroscopy.    Clinical Impression  Patient evaluated by Physical Therapy with no further acute PT needs identified. PTA, pt indep and lives with supportive family; has been dealing with L ankle pain since initial injury ~7 years ago. Today, pt ambulating well with crutches at supervision-level; reviewed therex/ROM and HEP handout provided with progression. All education has been completed and the patient has no further questions. Acute PT is signing off. Thank you for this referral.    Follow Up Recommendations No PT follow up(outpatient ortho PT once patient can bear weight)    Equipment Recommendations  None recommended by PT    Recommendations for Other Services       Precautions / Restrictions Precautions Precautions: Fall Restrictions Weight Bearing Restrictions: Yes LLE Weight Bearing: Non weight bearing Other Position/Activity Restrictions: CAM walker bot      Mobility  Bed Mobility Overal bed mobility: Independent                Transfers Overall transfer level: Modified independent Equipment used: Crutches             General transfer comment: Educ on technique  Ambulation/Gait Ambulation/Gait assistance: Supervision Gait Distance (Feet): 40 Feet Assistive device: Crutches   Gait velocity: Decreased   General Gait Details: Good ability to utilize crutches for hop-to gait on RLE; 1x instability requiring minA to correct  Stairs            Wheelchair Mobility    Modified [facility] (Stroke Patients Only)       Balance Overall balance assessment: Needs assistance   Sitting balance-Leahy Scale: Good       Standing  balance-Leahy Scale: Fair Standing balance comment: Can static stand on single LE without UE support                             Pertinent Vitals/Pain Pain Assessment: No/denies pain    Home Living Family/patient expects to be discharged to:: Private residence Living Arrangements: Spouse/significant other;Children Available Help at Discharge: Family;Available 24 hours/day Type of Home: House Home Access: Stairs to enter   [employer] of Steps: 2 Home Layout: One level Home Equipment: Crutches      Prior Function Level of Independence: Independent         Comments: Working at [employer], on his feet often     Hand Dominance        Extremity/Trunk Assessment   Upper Extremity Assessment Upper Extremity Assessment: Overall WFL for tasks assessed    Lower Extremity Assessment Lower Extremity Assessment: LLE deficits/detail LLE Deficits / Details: s/p L ankle fusion; hip/knee WFL       Communication   Communication: No difficulties  Cognition Arousal/Alertness: Awake/alert Behavior During Therapy: WFL for tasks assessed/performed Overall Cognitive Status: Within Functional Limits for tasks assessed                                        General Comments      Exercises Other Exercises Other Exercises: HEP handout provided: LAQ, SLR, side-lying  hip abd/add. Discussed progression to standing L hip flex/ext/abd/add   Assessment/Plan    PT Assessment Patent does not need any further PT services  PT Problem List         PT Treatment Interventions      PT Goals (Current goals can be found in the Care Plan section)  Acute Rehab PT Goals PT Goal Formulation: All assessment and education complete, DC therapy    Frequency     Barriers to discharge        Co-evaluation               AM-PAC PT "6 Clicks" Mobility  Outcome Measure Help needed turning from your back to your side while in a flat bed without  using bedrails?: None Help needed moving from lying on your back to sitting on the side of a flat bed without using bedrails?: None Help needed moving to and from a bed to a chair (including a wheelchair)?: None Help needed standing up from a chair using your arms (e.g., wheelchair or bedside chair)?: None Help needed to walk in hospital room?: A Little Help needed climbing 3-5 steps with a railing? : A Little 6 Click Score: 22    End of Session   Activity Tolerance: Patient tolerated treatment well Patient left: in bed;with call bell/phone within reach;with bed alarm set Nurse Communication: Mobility status PT Visit Diagnosis: Other abnormalities of gait and mobility (R26.89)    Time: [id number] PT Time Calculation (min) (ACUTE ONLY): 28 min   Charges:   PT Evaluation $PT Eval Low Complexity: 1 Low PT Treatments $Therapeutic Exercise: 8-22 mins      Ina HomesJaclyn Estle Fernandez, PT, DPT Acute Rehabilitation Services  Pager [phone] Office [phone]  Gerald ChamberJaclyn L Abhay Fernandez 02/24/2019, 12:24 PM

## 2019-02-24 NOTE — Progress Notes (Signed)
Orthopedic Tech Progress Note Patient Details:  Gerald Fernandez August 08, 1966 [id number]  Ortho Devices Type of Ortho Device: CAM walker Ortho Device/Splint Interventions: Application, Ordered, Adjustment   Post Interventions Patient Tolerated: Well Instructions Provided: Care of device, Adjustment of device, Poper ambulation with device   Melony Overly T 02/24/2019, 9:16 AM

## 2019-02-24 NOTE — H&P (Signed)
Gerald Fernandez is an 53 y.o. male.   Chief Complaint: Left ankle pain. HPI: Patient is a 53 year old gentleman who is a high demand person with activities who is status post left ankle arthroscopy and debridement for traumatic arthritis back in January.  Patient developed the traumatic arthritis from an ankle fracture.  Patient initially had excellent relief from the arthroscopy but has had persistent pain that is been worse over the past 2 weeks.  Patient states he cannot perform activities of daily living due to his increased pain.  Patient is wondering if he could proceed with a below the knee amputation to resume activities and eliminate his pain.  Past Medical History:  Diagnosis Date  . Allergic rhinitis   . Allergy   . Arthritis   . Complication of anesthesia   . Depression   . DVT (deep venous thrombosis) ([place]) 2010  . Hx of pulmonary embolus   . Impingement syndrome of left ankle   . PONV (postoperative nausea and vomiting)   . Smoker 12/02/07  . Tobacco use disorder   . 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 ANKLE ARTHROSCOPY Left   . APPENDECTOMY    . HAND SURGERY Left    fracture    Family History  Problem Relation Age of Onset  . COPD Mother   . Hypertension Father    Social History:  reports that he has been smoking cigarettes. He has a 21.00 pack-year smoking history. He has quit using smokeless tobacco. He reports current alcohol use of about 2.0 standard drinks of alcohol per week. He reports that he does not use drugs.  Allergies:  Allergies  Allergen Reactions  . Penicillins Other (See Comments)    From childhood Did it involve swelling of the face/tongue/throat, SOB, or low BP? Yes Did it involve sudden or severe rash/hives, skin peeling, or any reaction on the inside of your mouth or nose? Unknown Did you need to seek medical attention at a hospital or doctor's office? Unknown When did it last  happen?childhood reaction. If all above answers are "NO", may proceed with cephalosporin use.     Medications Prior to Admission  Medication Sig Dispense Refill  . Biotin w/ Vitamins C & E (HAIR/SKIN/NAILS PO) Take 2 tablets by mouth daily.    Marland Kitchen buPROPion (WELLBUTRIN SR) 150 MG 12 hr tablet Take 1 tablet (150 mg total) by mouth 2 (two) times daily. (Patient taking differently: Take 150 mg by mouth daily. ) 60 tablet 1  . Glucosamine-Chondroitin (COSAMIN DS PO) Take 1 tablet by mouth daily.    Marland Kitchen ibuprofen (ADVIL,MOTRIN) 200 MG tablet Take 600 mg by mouth every 6 (six) hours as needed for pain.     . Misc Natural Products (NF FORMULAS TESTOSTERONE PO) Take 3 tablets by mouth daily.    . Multiple Vitamin (MULTIVITAMIN WITH MINERALS) TABS tablet Take 1 tablet by mouth daily. Centrum Gummies    . varenicline (CHANTIX STARTING MONTH PAK) 0.5 MG X 11 & 1 MG X 42 tablet Take 0.5-1 mg by mouth 2 (two) times daily. Take one 0.5 mg tablet by mouth once daily for 3 days, then increase to one 0.5 mg tablet twice daily for 4 days, then increase to one 1 mg tablet twice daily.    . diclofenac sodium (VOLTAREN) 1 % GEL Apply 2 g topically 4 (four) times daily. (Patient not taking: Reported on 02/18/2019) 1 Tube 3  . HYDROcodone-acetaminophen (  NORCO/VICODIN) 5-325 MG tablet Take 1 tablet by mouth every 6 (six) hours as needed for moderate pain. (Patient not taking: Reported on 02/15/2019) 30 tablet 0    Results for orders placed or performed during the hospital encounter of 02/24/19 (from the past 48 hour(s))  CBC     Status: None   Collection Time: 02/24/19  6:26 AM  Result Value Ref Range   WBC 9.9 4.0 - 10.5 K/uL   RBC 4.78 4.22 - 5.81 MIL/uL   Hemoglobin 15.3 13.0 - 17.0 g/dL   HCT 40.945.6 81.139.0 - 91.452.0 %   MCV 95.4 80.0 - 100.0 fL   MCH 32.0 26.0 - 34.0 pg   MCHC 33.6 30.0 - 36.0 g/dL   RDW 78.212.0 95.611.5 - 21.315.5 %   Platelets 198 150 - 400 K/uL   nRBC 0.0 0.0 - 0.2 %    Comment: Performed at [facility], [address] [place], [place] [address]  Basic metabolic panel     Status: Abnormal   Collection Time: 02/24/19  6:26 AM  Result Value Ref Range   Sodium 140 135 - 145 mmol/L   Potassium 3.6 3.5 - 5.1 mmol/L   Chloride 105 98 - 111 mmol/L   CO2 26 22 - 32 mmol/L   Glucose, Bld 91 70 - 99 mg/dL   BUN 15 6 - 20 mg/dL   Creatinine, Ser 8.460.91 0.61 - 1.24 mg/dL   Calcium 8.5 (L) 8.9 - 10.3 mg/dL   GFR calc non Af Amer >60 >60 mL/min   GFR calc Af Amer >60 >60 mL/min   Anion gap 9 5 - 15    Comment: Performed at [facility], [address] [place], [place] [address]   No results found.  Review of Systems  All other systems reviewed and are negative.   Blood pressure 129/73, pulse (!) 57, temperature 97.7 F (36.5 C), temperature source Oral, resp. rate 18, height 5\' 10"  (1.778 m), weight 63.5 kg, SpO2 100 %. Physical Exam  Patient is alert, oriented, no adenopathy, well-dressed, normal affect, normal respiratory effort. Examination patient has good hair growth to the foot he has good pulses.  He has valgus alignment to the hindfoot.  Radiographs shows valgus alignment of the talus within the plafond.  There is bone-on-bone contact of the lateral joint line and there are large osteophytic bone spurs anteriorly and posteriorly.  Patient does have some subtalar arthritis as well but he has good subtalar forefoot and midfoot range of motion and this is not painful.  Patient has good bone quality no large cysts.  Assessment/Plan 1. Post-traumatic arthritis of left ankle     Plan: Discussed with the patient that I feel he is too high demand person for a total ankle arthroplasty do not feel that a transtibial amputation is recommended at this time feel that we can get him good results with a mini open fusion with a small anterior plate and 2 cannulated screws.  Risks and benefits were discussed including delayed healing infection need for additional surgery.  Patient states  he understands would like to proceed with an ankle fusion as soon as possible.   Gerald MustardMarcus V Danilynn Jemison, MD 02/24/2019, 7:24 AM

## 2019-02-24 NOTE — Anesthesia Procedure Notes (Signed)
Procedure Name: LMA Insertion Date/Time: 02/24/2019 7:51 AM Performed by: Kyung Rudd, CRNA Pre-anesthesia Checklist: Patient identified, Emergency Drugs available, Suction available and Patient being monitored Patient Re-evaluated:Patient Re-evaluated prior to induction Oxygen Delivery Method: Circle system utilized Preoxygenation: Pre-oxygenation with 100% oxygen Induction Type: IV induction LMA: LMA inserted LMA Size: 4.0 Number of attempts: 1 Placement Confirmation: positive ETCO2 and breath sounds checked- equal and bilateral Tube secured with: Tape Dental Injury: Teeth and Oropharynx as per pre-operative assessment

## 2019-02-24 NOTE — Op Note (Signed)
02/24/2019  9:04 AM  PATIENT:  Gerald Fernandez    PRE-OPERATIVE DIAGNOSIS:  Osteoarthritis Left Ankle  POST-OPERATIVE DIAGNOSIS:  Same  PROCEDURE:  LEFT OPEN ANKLE FUSION C-arm fluoroscopy to verify reduction. Bone graft 10 cc.  SURGEON:  Newt Minion, MD  PHYSICIAN ASSISTANT:None ANESTHESIA:   General  PREOPERATIVE INDICATIONS:  Gerald Fernandez is a  53 y.o. male with a diagnosis of Osteoarthritis Left Ankle who failed conservative measures and elected for surgical management.    The risks benefits and alternatives were discussed with the patient preoperatively including but not limited to the risks of infection, bleeding, nerve injury, cardiopulmonary complications, the need for revision surgery, among others, and the patient was willing to proceed.  OPERATIVE IMPLANTS: 10 cc bone graft Arthrex anterior fusion plate.  @ENCIMAGES @  OPERATIVE FINDINGS: Good cancellus bone at the fusion.  Radiographs showed stable alignment AP and lateral views  OPERATIVE PROCEDURE: Patient is brought the operating room underwent a general anesthetic.  After adequate levels anesthesia were obtained patient's left lower extremity was prepped using DuraPrep draped into a sterile field a timeout was called patient received clindamycin.  Anterior incision was made between the EHL and anterior tibial tendon.  This was carried down to bone subperiosteal dissection was performed at the ankle joint.  Using an oscillating saw osteotome and curette the joint was resected 90 degrees perpendicular to the ligament 1 axis of the tibia this was debrided reduced and an anterior plate was applied.  Provisional fixation showed stable alignment radiographically.  The compression screws were placed x2 static locking screws x4.  C-arm fluoroscopy again verified reduction.  The subtalar joint was open.  Wound was irrigated with normal saline throughout the case.  The incision was closed using 2-0 nylon a sterile dressing was  applied patient was extubated taken the PACU in stable condition.   DISCHARGE PLANNING:  Antibiotic duration: 24-hour antibiotics  Weightbearing: Nonweightbearing on the left  Pain medication: Opioid pathway  Dressing care/ Wound VAC: Dry dressing  Ambulatory devices: Walker  Discharge to:  home  Follow-up: In the office 1 week post operative.

## 2019-02-24 NOTE — Anesthesia Procedure Notes (Signed)
Anesthesia Regional Block: Adductor canal block   Pre-Anesthetic Checklist: ,, timeout performed, Correct Patient, Correct Site, Correct Laterality, Correct Procedure, Correct Position, site marked, Risks and benefits discussed,  Surgical consent,  Pre-op evaluation,  At surgeon's request and post-op pain management  Laterality: Left  Prep: Maximum Sterile Barrier Precautions used, chloraprep       Needles:  Injection technique: Single-shot  Needle Type: Echogenic Stimulator Needle     Needle Length: 9cm  Needle Gauge: 22     Additional Needles:   Procedures:,,,, ultrasound used (permanent image in chart),,,,  Narrative:  Start time: 02/24/2019 7:29 AM End time: 02/24/2019 7:39 AM Injection made incrementally with aspirations every 5 mL.  Performed by: Personally  Anesthesiologist: Freddrick March, MD  Additional Notes: Monitors applied. No increased pain on injection. No increased resistance to injection. Injection made in 5cc increments. Good needle visualization. Patient tolerated procedure well.

## 2019-02-24 NOTE — Anesthesia Procedure Notes (Signed)
Anesthesia Regional Block: Popliteal block   Pre-Anesthetic Checklist: ,, timeout performed, Correct Patient, Correct Site, Correct Laterality, Correct Procedure, Correct Position, site marked, Risks and benefits discussed,  Surgical consent,  Pre-op evaluation,  At surgeon's request and post-op pain management  Laterality: Left  Prep: Maximum Sterile Barrier Precautions used, chloraprep       Needles:  Injection technique: Single-shot  Needle Type: Echogenic Stimulator Needle     Needle Length: 9cm  Needle Gauge: 22     Additional Needles:   Procedures:,,,, ultrasound used (permanent image in chart),,,,  Narrative:  Start time: 02/24/2019 7:18 AM End time: 02/24/2019 7:28 AM Injection made incrementally with aspirations every 5 mL.  Performed by: Personally  Anesthesiologist: Freddrick March, MD  Additional Notes: Monitors applied. No increased pain on injection. No increased resistance to injection. Injection made in 5cc increments. Good needle visualization. Patient tolerated procedure well.

## 2019-02-25 ENCOUNTER — Encounter (HOSPITAL_COMMUNITY): Payer: Self-pay | Admitting: Orthopedic Surgery

## 2019-02-25 ENCOUNTER — Telehealth: Payer: Self-pay | Admitting: Orthopedic Surgery

## 2019-02-25 DIAGNOSIS — M19072 Primary osteoarthritis, left ankle and foot: Secondary | ICD-10-CM | POA: Diagnosis not present

## 2019-02-25 MED ORDER — OXYCODONE-ACETAMINOPHEN 5-325 MG PO TABS: 1.0000 | ORAL_TABLET | ORAL | 0 refills | Status: DC | PRN

## 2019-02-25 NOTE — Progress Notes (Signed)
Provided discharge education/instructions, all questions and concerns addressed, Pt not in distress, to discharge home with belongings. 

## 2019-02-25 NOTE — Telephone Encounter (Signed)
Patient called and asked if he can get stronger medication In so much pain.  Please call patient @ [phone]

## 2019-02-25 NOTE — Discharge Summary (Signed)
Discharge Diagnoses:  Active Problems:   S/P ankle fusion   Post-traumatic osteoarthritis of left ankle   Surgeries: Procedure(s): LEFT OPEN ANKLE FUSION on 02/24/2019    Consultants:   Discharged Condition: Improved  Hospital Course: HOMAR WEINKAUF is an 53 y.o. male who was admitted 02/24/2019 with a chief complaint of traumatic arthritis left ankle, with a final diagnosis of Osteoarthritis Left Ankle.  Patient was brought to the operating room on 02/24/2019 and underwent Procedure(s): LEFT OPEN ANKLE FUSION.    Patient was given perioperative antibiotics:  Anti-infectives (From admission, onward)   Start     Dose/Rate Route Frequency Ordered Stop   02/24/19 1400  clindamycin (CLEOCIN) IVPB 600 mg     600 mg 100 mL/hr over 30 Minutes Intravenous Every 6 hours 02/24/19 1006 02/25/19 0349   02/24/19 0600  clindamycin (CLEOCIN) IVPB 900 mg     900 mg 100 mL/hr over 30 Minutes Intravenous On call to O.R. 02/24/19 [id number] 02/24/19 0825    .  Patient was given sequential compression devices, early ambulation, and aspirin for DVT prophylaxis.  Recent vital signs:  Patient Vitals for the past 24 hrs:  BP Temp Temp src Pulse Resp SpO2  02/25/19 0001 (!) 93/56 - - (!) 48 18 97 %  02/24/19 2028 (!) 106/51 98.3 F (36.8 C) Oral (!) 55 18 97 %  02/24/19 1005 110/69 - - (!) 53 17 100 %  02/24/19 0945 125/78 - - (!) 53 12 100 %  02/24/19 0930 (!) 106/95 (!) 97 F (36.1 C) - (!) 53 10 100 %  02/24/19 0915 126/79 - - (!) 55 12 100 %  02/24/19 0900 134/81 (!) 97.2 F (36.2 C) - (!) 52 15 100 %  .  Recent laboratory studies: No results found.  Discharge Medications:   Allergies as of 02/25/2019      Reactions   Penicillins Other (See Comments)   From childhood Did it involve swelling of the face/tongue/throat, SOB, or low BP? Yes Did it involve sudden or severe rash/hives, skin peeling, or any reaction on the inside of your mouth or nose? Unknown Did you need to seek medical attention  at a hospital or doctor's office? Unknown When did it last happen?childhood reaction. If all above answers are "NO", may proceed with cephalosporin use.      Medication List    STOP taking these medications   HYDROcodone-acetaminophen 5-325 MG tablet Commonly known as: NORCO/VICODIN     TAKE these medications   buPROPion 150 MG 12 hr tablet Commonly known as: Wellbutrin SR Take 1 tablet (150 mg total) by mouth 2 (two) times daily. What changed: when to take this   Chantix Starting Month Pak 0.5 MG X 11 & 1 MG X 42 tablet Generic drug: varenicline Take 0.5-1 mg by mouth 2 (two) times daily. Take one 0.5 mg tablet by mouth once daily for 3 days, then increase to one 0.5 mg tablet twice daily for 4 days, then increase to one 1 mg tablet twice daily.   COSAMIN DS PO Take 1 tablet by mouth daily.   diclofenac sodium 1 % Gel Commonly known as: VOLTAREN Apply 2 g topically 4 (four) times daily.   HAIR/SKIN/NAILS PO Take 2 tablets by mouth daily.   ibuprofen 200 MG tablet Commonly known as: ADVIL Take 600 mg by mouth every 6 (six) hours as needed for pain.   multivitamin with minerals Tabs tablet Take 1 tablet by mouth daily. Centrum Gummies  NF FORMULAS TESTOSTERONE PO Take 3 tablets by mouth daily.   oxyCODONE-acetaminophen 5-325 MG tablet Commonly known as: PERCOCET/ROXICET Take 1 tablet by mouth every 4 (four) hours as needed.            Discharge Care Instructions  (From admission, onward)         Start     Ordered   02/25/19 0000  Non weight bearing    Question Answer Comment  Laterality left   Extremity Lower      02/25/19 0810          Diagnostic Studies: No results found.  Patient benefited maximally from their hospital stay and there were no complications.     Disposition: Discharge disposition: 01-Home or Self Care      Discharge Instructions    Call MD / Call 911   Complete by: As directed    If you experience chest pain or  shortness of breath, CALL 911 and be transported to the hospital emergency room.  If you develope a fever above 101 F, pus (white drainage) or increased drainage or redness at the wound, or calf pain, call your surgeon's office.   Constipation Prevention   Complete by: As directed    Drink plenty of fluids.  Prune juice may be helpful.  You may use a stool softener, such as Colace (over the counter) 100 mg twice a day.  Use MiraLax (over the counter) for constipation as needed.   Diet - low sodium heart healthy   Complete by: As directed    Increase activity slowly as tolerated   Complete by: As directed    Non weight bearing   Complete by: As directed    Laterality: left   Extremity: Lower     Follow-up Information    Nadara Mustarduda, Sharvil Hoey V, MD In 1 week.   Specialty: Orthopedic Surgery Contact information: [address] [place] [address] [phone]            Signed: Nadara MustardMarcus V Zollie Ellery 02/25/2019, 8:10 AM

## 2019-02-25 NOTE — Telephone Encounter (Signed)
Rx was sent to pharmacy 02/25/2019

## 2019-02-25 NOTE — Progress Notes (Signed)
Patient ID: Gerald Fernandez, male   DOB: 27-Nov-1965, 53 y.o.   MRN: [id number] Postoperative day 1 ankle fusion.  The dressing is clean and dry.  Patient states the nerve block is starting to wear off.  We will plan for discharge today discussed the importance of nonweightbearing.  Follow-up in office in 1 week.

## 2019-02-26 ENCOUNTER — Telehealth: Payer: Self-pay

## 2019-02-26 NOTE — Telephone Encounter (Signed)
Pt called and states that he received the rx for Percocet yesterday but that he needs something stronger. I advised that he really needs to make sure that he is working on elevation. Keep his foot higher than his heart to control swelling. Advised that he can take 2 aleve BID and that he should not take his pain medication any more that it has been written for. He can apply ice 15-20 minutes at a time 3-4 time through the day and he can also remove the outer bandage of coban to see if this will release some presser. Offered an appt to come in today and pt advised would like Monday appt. This was made. Pt voiced understanding and is happy with plan will call with any questions.

## 2019-03-01 ENCOUNTER — Ambulatory Visit (INDEPENDENT_AMBULATORY_CARE_PROVIDER_SITE_OTHER): Payer: 59 | Admitting: Orthopedic Surgery

## 2019-03-01 ENCOUNTER — Other Ambulatory Visit: Payer: Self-pay

## 2019-03-01 ENCOUNTER — Encounter: Payer: Self-pay | Admitting: Orthopedic Surgery

## 2019-03-01 VITALS — Ht 70.0 in | Wt 140.0 lb

## 2019-03-01 DIAGNOSIS — M19172 Post-traumatic osteoarthritis, left ankle and foot: Secondary | ICD-10-CM

## 2019-03-01 NOTE — Progress Notes (Signed)
Office Visit Note   Patient: Gerald Fernandez           Date of Birth: [birth year]           MRN: [id number] Visit Date: 03/01/2019              Requested by: Denita Lung, MD [address] [place],  [place] [address] PCP: Denita Lung, MD  Chief Complaint  Patient presents with  . Left Ankle - Routine Post Op    02/24/19 left open ankle fusion      HPI: Patient is a 53 year old gentleman who presents status post left ankle fusion.  Patient states he had some drainage from the dressing.  Assessment & Plan: Visit Diagnoses:  1. Post-traumatic arthritis of left ankle     Plan: Patient will begin Dial soap cleansing dry dressing change daily continue the fracture boot nonweightbearing on the left follow-up in 1 week to possibly harvest the sutures.  Follow-Up Instructions: Return in about 1 week (around 03/08/2019).   Ortho Exam  Patient is alert, oriented, no adenopathy, well-dressed, normal affect, normal respiratory effort. Examination there is minimal swelling the wound edges are well approximated there is no redness no cellulitis no signs of infection his foot is plantigrade.  There is no drainage.  Imaging: No results found. No images are attached to the encounter.  Labs: No results found for: HGBA1C, ESRSEDRATE, CRP, LABURIC, REPTSTATUS, GRAMSTAIN, CULT, LABORGA   Lab Results  Component Value Date   ALBUMIN 4.4 09/03/2018   ALBUMIN 3.1 (L) 04/06/2013   ALBUMIN 3.3 (L) 04/05/2013    Body mass index is 20.09 kg/m.  Orders:  No orders of the defined types were placed in this encounter.  No orders of the defined types were placed in this encounter.    Procedures: No procedures performed  Clinical Data: No additional findings.  ROS:  All other systems negative, except as noted in the HPI. Review of Systems  Objective: Vital Signs: Ht 5\' 10"  (1.778 m)   Wt 140 lb (63.5 kg)   BMI 20.09 kg/m   Specialty Comments:  No specialty comments  available.  PMFS History: Patient Active Problem List   Diagnosis Date Noted  . S/P ankle fusion 02/24/2019  . Post-traumatic osteoarthritis of left ankle   . 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 Impingement syndrome of left ankle 03/27/2017  . History of pulmonary embolus (PE) 04/04/2013  . Current smoker 04/04/2013   Past Medical History:  Diagnosis Date  . Allergic rhinitis   . Allergy   . Arthritis   . Complication of anesthesia   . Depression   . DVT (deep venous thrombosis) ([place]) 2010  . Hx of pulmonary embolus   . Impingement syndrome of left ankle   . PONV (postoperative nausea and vomiting)   . Smoker 12/02/07  . Tobacco use disorder   . 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 COPD Mother   . Hypertension Father     Past Surgical History:  Procedure Laterality Date  . ANKLE ARTHROSCOPY Left   . ANKLE FUSION Left 02/24/2019   Procedure: LEFT OPEN ANKLE FUSION;  Surgeon: Newt Minion, MD;  Location: [facility];  Service: Orthopedics;  Laterality: Left;  . APPENDECTOMY    . HAND SURGERY Left    fracture   Social History   Occupational History  . Not on file  Tobacco Use  .  Smoking status: Current Every Day Smoker    Packs/day: 1.00    Years: 21.00    Pack years: 21.00    Types: Cigarettes  . Smokeless tobacco: Former Engineer, waterUser  Substance and Sexual Activity  . Alcohol use: Yes    Alcohol/week: 2.0 standard drinks    Types: 2 Standard drinks or equivalent per week  . Drug use: No  . Sexual activity: Yes

## 2019-03-02 ENCOUNTER — Telehealth: Payer: Self-pay | Admitting: Orthopedic Surgery

## 2019-03-02 ENCOUNTER — Other Ambulatory Visit: Payer: Self-pay | Admitting: Orthopedic Surgery

## 2019-03-02 MED ORDER — OXYCODONE-ACETAMINOPHEN 5-325 MG PO TABS: 1.0000 | ORAL_TABLET | Freq: Four times a day (QID) | ORAL | 0 refills | Status: DC | PRN

## 2019-03-02 NOTE — Telephone Encounter (Signed)
Pt called in requesting a refill on his medication oxycodone, says if you cant refill that to please send in something else he just needs something for the pain.   [phone]

## 2019-03-02 NOTE — Telephone Encounter (Signed)
I called and lm on vm to advise that rx has been faxed to the pharm.

## 2019-03-02 NOTE — Telephone Encounter (Signed)
rx sent

## 2019-03-02 NOTE — Telephone Encounter (Signed)
02/25/19 s/p left open ankle fracture. Last refill on percocet 5/325 # 30 pt is calling and asking for refill please advise.

## 2019-03-04 ENCOUNTER — Inpatient Hospital Stay: Payer: 59 | Admitting: Orthopedic Surgery

## 2019-03-08 ENCOUNTER — Other Ambulatory Visit: Payer: Self-pay

## 2019-03-08 ENCOUNTER — Encounter: Payer: Self-pay | Admitting: Orthopedic Surgery

## 2019-03-08 ENCOUNTER — Ambulatory Visit (INDEPENDENT_AMBULATORY_CARE_PROVIDER_SITE_OTHER): Payer: 59 | Admitting: Physician Assistant

## 2019-03-08 VITALS — Ht 70.0 in | Wt 140.0 lb

## 2019-03-08 DIAGNOSIS — Z981 Arthrodesis status: Secondary | ICD-10-CM

## 2019-03-08 DIAGNOSIS — M19172 Post-traumatic osteoarthritis, left ankle and foot: Secondary | ICD-10-CM

## 2019-03-08 NOTE — Progress Notes (Signed)
Office Visit Note   Patient: Gerald Fernandez           Date of Birth: [birth year]           MRN: [id number] Visit Date: 03/08/2019              Requested by: Denita Lung, MD [address] [place],  [place] [address] PCP: Denita Lung, MD  Chief Complaint  Patient presents with  . Left Ankle - Routine Post Op    02/24/19 left ankle fusion      HPI: The patient is a 53 year old gentleman who is seen for postoperative follow-up following open left ankle fusion on 02/24/2019.  He has been nonweightbearing in a fracture boot and utilizing crutches for ambulation.  He still having some pain but reports he is overall doing much better and his edema continues to slowly improve.     Assessment & Plan: Visit Diagnoses:  1. S/P ankle fusion   2. Post-traumatic arthritis of left ankle     Plan:Sutures removed this visit. Continue non weight bearing in fracture boot. Prescription for Vive medical compression sock to wear around the clock except for showering.  Given note for work to be out at least another 4 weeks.  Follow up in 2 weeks with radiographs at that time.   Follow-Up Instructions: Return in about 2 weeks (around 03/22/2019).   Ortho Exam  Patient is alert, oriented, no adenopathy, well-dressed, normal affect, normal respiratory effort. The left ankle incision is healing well and is clean dry and intact.  Sutures will be harvested today.  There is some mild localized edema about the ankle joint itself.  Good pedal pulses.  No signs of cellulitis or infection. Imaging: No results found.   Labs: No results found for: HGBA1C, ESRSEDRATE, CRP, LABURIC, REPTSTATUS, GRAMSTAIN, CULT, LABORGA   Lab Results  Component Value Date   ALBUMIN 4.4 09/03/2018   ALBUMIN 3.1 (L) 04/06/2013   ALBUMIN 3.3 (L) 04/05/2013    No results found for: MG No results found for: VD25OH  No results found for: PREALBUMIN CBC EXTENDED Latest Ref Rng & Units 02/24/2019 09/03/2018 04/06/2013   WBC 4.0 - 10.5 K/uL 9.9 9.2 16.0(H)  RBC 4.22 - 5.81 MIL/uL 4.78 5.27 4.68  HGB 13.0 - 17.0 g/dL 15.3 16.3 14.3  HCT 39.0 - 52.0 % 45.6 47.7 41.6  PLT 150 - 400 K/uL 198 252 178  NEUTROABS 1.4 - 7.0 x10E3/uL - 6.0 -  LYMPHSABS 0.7 - 3.1 x10E3/uL - 2.1 -     Body mass index is 20.09 kg/m.  Orders:  No orders of the defined types were placed in this encounter.  No orders of the defined types were placed in this encounter.    Procedures: No procedures performed  Clinical Data: No additional findings.  ROS:  All other systems negative, except as noted in the HPI. Review of Systems  Objective: Vital Signs: Ht 5\' 10"  (1.778 m)   Wt 140 lb (63.5 kg)   BMI 20.09 kg/m   Specialty Comments:  No specialty comments available.  PMFS History: Patient Active Problem List   Diagnosis Date Noted  . S/P ankle fusion 02/24/2019  . Post-traumatic osteoarthritis of left ankle   . 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 Impingement syndrome of left ankle 03/27/2017  . History of pulmonary embolus (PE) 04/04/2013  . Current smoker 04/04/2013   Past Medical History:  Diagnosis Date  . Allergic rhinitis   . Allergy   .  Arthritis   . Complication of anesthesia   . Depression   . DVT (deep venous thrombosis) ([facility]) 2010  . Hx of pulmonary embolus   . Impingement syndrome of left ankle   . PONV (postoperative nausea and vomiting)   . Smoker 12/02/07  . Tobacco use disorder   . 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 COPD Mother   . Hypertension Father     Past Surgical History:  Procedure Laterality Date  . ANKLE ARTHROSCOPY Left   . ANKLE FUSION Left 02/24/2019   Procedure: LEFT OPEN ANKLE FUSION;  Surgeon: Nadara Mustarduda, Marcus V, MD;  [facility];  Service: Orthopedics;  Laterality: Left;  . APPENDECTOMY    . HAND SURGERY Left    fracture   Social History   Occupational History  . Not on file   Tobacco Use  . Smoking status: Current Every Day Smoker    Packs/day: 1.00    Years: 21.00    Pack years: 21.00    Types: Cigarettes  . Smokeless tobacco: Former Engineer, waterUser  Substance and Sexual Activity  . Alcohol use: Yes    Alcohol/week: 2.0 standard drinks    Types: 2 Standard drinks or equivalent per week  . Drug use: No  . Sexual activity: Yes

## 2019-03-09 ENCOUNTER — Other Ambulatory Visit: Payer: Self-pay | Admitting: Family Medicine

## 2019-03-09 DIAGNOSIS — F341 Dysthymic disorder: Secondary | ICD-10-CM

## 2019-03-09 NOTE — Telephone Encounter (Signed)
[facility]  Well[facility]. Please advise [facility]

## 2019-03-17 ENCOUNTER — Encounter: Payer: Self-pay | Admitting: Family Medicine

## 2019-03-17 ENCOUNTER — Other Ambulatory Visit: Payer: Self-pay

## 2019-03-17 ENCOUNTER — Ambulatory Visit (INDEPENDENT_AMBULATORY_CARE_PROVIDER_SITE_OTHER): Payer: 59 | Admitting: Family Medicine

## 2019-03-17 VITALS — Wt 140.0 lb

## 2019-03-17 DIAGNOSIS — F341 Dysthymic disorder: Secondary | ICD-10-CM

## 2019-03-17 DIAGNOSIS — Z630 Problems in relationship with spouse or partner: Secondary | ICD-10-CM

## 2019-03-17 DIAGNOSIS — F17200 Nicotine dependence, unspecified, uncomplicated: Secondary | ICD-10-CM | POA: Diagnosis not present

## 2019-03-17 DIAGNOSIS — Z981 Arthrodesis status: Secondary | ICD-10-CM | POA: Diagnosis not present

## 2019-03-17 NOTE — Progress Notes (Signed)
   Subjective:    Patient ID: Gerald Fernandez, male    DOB: [birth year], 53 y.o.   MRN: [id number]  HPI Documentation for virtual telephone encounter.  Documentation for virtual audio and video telecommunications through [facility] encounter: The patient was located at home. The provider was located in the office. The patient did consent to this visit and is aware of possible charges through their insurance for this visit. The other persons participating in this telemedicine service were none. Time spent on call was 5 minutes and in review of previous records >25 minutes total. This virtual service is not related to other E/M service within previous 7 days. He is now on Wellbutrin 2 pills/day and states that he does feel slightly better with less stress.  He continues in counseling and is making good inroads in dealing with better communication as well as emotions.  He feels very good about this. He has cut his cigarette consumption down to 2 cigarettes/day.  He is very happy with this. He is now also 2 weeks post surgery on his foot and is making progress there.  He is nonweightbearing.  He will follow-up with Dr. Sharol Given later this week.    Review of Systems     Objective:   Physical Exam Alert and in no distress with appropriate affect.       Assessment & Plan:   Encounter Diagnoses  Name Primary?  . Dysthymia Yes  . Current smoker   . Marital stress   . S/P ankle fusion   He is to continue on his present Wellbutrin dosing. Discussed smoking with him and extensively explained at this point he can quit whenever he wants.  Discussed possible stressors or situations that might make him start smoking again. He will follow-up with Dr. Sharol Given. He plans to continue with his counseling.  I discussed emotions and expressing them freely he seems to be making good inroads in this.  Also talked about using first person communication skills. I will see him again in 1 month. Over 25 minutes, the  entire time spent in counseling.

## 2019-03-25 ENCOUNTER — Encounter: Payer: Self-pay | Admitting: Physician Assistant

## 2019-03-25 ENCOUNTER — Ambulatory Visit (INDEPENDENT_AMBULATORY_CARE_PROVIDER_SITE_OTHER): Payer: 59

## 2019-03-25 ENCOUNTER — Ambulatory Visit (INDEPENDENT_AMBULATORY_CARE_PROVIDER_SITE_OTHER): Payer: 59 | Admitting: Physician Assistant

## 2019-03-25 VITALS — Ht 70.0 in | Wt 140.0 lb

## 2019-03-25 DIAGNOSIS — M19172 Post-traumatic osteoarthritis, left ankle and foot: Secondary | ICD-10-CM

## 2019-03-25 DIAGNOSIS — Z981 Arthrodesis status: Secondary | ICD-10-CM

## 2019-03-25 NOTE — Progress Notes (Signed)
Office Visit Note   Patient: Vanita Inglesatrick W Gammon           Date of Birth: 09/22/1965           MRN: [id number] Visit Date: 03/25/2019              Requested by: Ronnald NianLalonde, John C, MD [address] [place],  [place] [id number] PCP: Ronnald NianLalonde, John C, MD  Chief Complaint  Patient presents with  . Left Ankle - Routine Post Op    02/24/2019 left ankle fusion      HPI: The patient is seen for postoperative follow-up following an open left ankle fusion on 02/24/2019.  He has been nonweightbearing in a fracture boot and wearing his medical compression sock.  He does notice some discomfort over the heel and arch.  He does note some increased edema when he is up and around.  Assessment & Plan: Visit Diagnoses:  1. S/P ankle fusion   2. Post-traumatic arthritis of left ankle     Plan: May begin weight bearing with fracture boot and crutches to tolerance.  Follow up in 2 weeks with radiographs.  Note to remain out of work for another 4 weeks.   Follow-Up Instructions: Return in about 2 weeks (around 04/08/2019).   Ortho Exam  Patient is alert, oriented, no adenopathy, well-dressed, normal affect, normal respiratory effort. The left ankle incision is clean dry and intact and healing well.  There are no signs of cellulitis or infection.  There is some mild localized edema about the ankle only.  He is nontender to palpation.  He has good pedal pulses, but his toes are cool to palpation but have good capillary refill.  Imaging: No results found. No images are attached to the encounter.  Labs: No results found for: HGBA1C, ESRSEDRATE, CRP, LABURIC, REPTSTATUS, GRAMSTAIN, CULT, LABORGA   Lab Results  Component Value Date   ALBUMIN 4.4 09/03/2018   ALBUMIN 3.1 (L) 04/06/2013   ALBUMIN 3.3 (L) 04/05/2013    No results found for: MG No results found for: VD25OH  No results found for: PREALBUMIN CBC EXTENDED Latest Ref Rng & Units 02/24/2019 09/03/2018 04/06/2013  WBC 4.0 - 10.5 K/uL 9.9 9.2  16.0(H)  RBC 4.22 - 5.81 MIL/uL 4.78 5.27 4.68  HGB 13.0 - 17.0 g/dL 19.115.3 47.816.3 29.514.3  HCT 62.139.0 - 52.0 % 45.6 47.7 41.6  PLT 150 - 400 K/uL 198 252 178  NEUTROABS 1.4 - 7.0 x10E3/uL - 6.0 -  LYMPHSABS 0.7 - 3.1 x10E3/uL - 2.1 -     Body mass index is 20.09 kg/m.  Orders:  Orders Placed This Encounter  Procedures  . XR Ankle Complete Left   No orders of the defined types were placed in this encounter.    Procedures: No procedures performed  Clinical Data: No additional findings.  ROS:  All other systems negative, except as noted in the HPI. Review of Systems  Objective: Vital Signs: Ht 5\' 10"  (1.778 m)   Wt 140 lb (63.5 kg)   BMI 20.09 kg/m   Specialty Comments:  No specialty comments available.  PMFS History: Patient Active Problem List   Diagnosis Date Noted  . S/P ankle fusion 02/24/2019  . Post-traumatic osteoarthritis of left ankle   . 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 Impingement syndrome of left ankle 03/27/2017  . History of pulmonary embolus (PE) 04/04/2013  . Current smoker 04/04/2013   Past Medical History:  Diagnosis Date  . Allergic rhinitis   .  Allergy   . Arthritis   . Complication of anesthesia   . Depression   . DVT (deep venous thrombosis) ([place]) 2010  . Hx of pulmonary embolus   . Impingement syndrome of left ankle   . PONV (postoperative nausea and vomiting)   . Smoker 12/02/07  . Tobacco use disorder   . 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 COPD Mother   . Hypertension Father     Past Surgical History:  Procedure Laterality Date  . ANKLE ARTHROSCOPY Left   . ANKLE FUSION Left 02/24/2019   Procedure: LEFT OPEN ANKLE FUSION;  Surgeon: Newt Minion, MD;  Location: [facility];  Service: Orthopedics;  Laterality: Left;  . APPENDECTOMY    . HAND SURGERY Left    fracture   Social History   Occupational History  . Not on file  Tobacco Use  . Smoking  status: Current Every Day Smoker    Packs/day: 1.00    Years: 21.00    Pack years: 21.00    Types: Cigarettes  . Smokeless tobacco: Former Network engineer and Sexual Activity  . Alcohol use: Yes    Alcohol/week: 2.0 standard drinks    Types: 2 Standard drinks or equivalent per week  . Drug use: No  . Sexual activity: Yes

## 2019-04-09 ENCOUNTER — Encounter: Payer: Self-pay | Admitting: Family

## 2019-04-09 ENCOUNTER — Ambulatory Visit (INDEPENDENT_AMBULATORY_CARE_PROVIDER_SITE_OTHER): Payer: 59

## 2019-04-09 ENCOUNTER — Ambulatory Visit (INDEPENDENT_AMBULATORY_CARE_PROVIDER_SITE_OTHER): Payer: 59 | Admitting: Family

## 2019-04-09 VITALS — Ht 70.0 in | Wt 140.0 lb

## 2019-04-09 DIAGNOSIS — Z981 Arthrodesis status: Secondary | ICD-10-CM

## 2019-04-10 ENCOUNTER — Encounter: Payer: Self-pay | Admitting: Family

## 2019-04-10 NOTE — Progress Notes (Signed)
   Post-Op Visit Note   Patient: Gerald Fernandez           Date of Birth: [birth year]           MRN: [id number] Visit Date: 04/09/2019 PCP: Denita Lung, MD  Chief Complaint:  Chief Complaint  Patient presents with  . Left Ankle - Routine Post Op    02/24/19 Left ankle fusion     HPI:  HPI The patient is a 53 year old gentleman who presents today for evaluation of his left ankle he is status post a left ankle fusion on June 24 of this year.  He has been nonweightbearing with a fracture boot and crutches.  Is also wearing a medical compression stocking.  He states he feels well has not had issues with pain. Ortho Exam On examination his incisions are well-healed there is no drainage no erythema no warmth no sign of infection Visit Diagnoses:  1. S/P ankle fusion     Plan: We will hold off on weightbearing until we see further callus formation.  He will continue with his compression stocking continue out of work.  Continue nonweightbearing in his cam walker he will follow-up in the office with Dr. Sharol Given in 3 weeks.  Follow-Up Instructions: No follow-ups on file.   Imaging: No results found.  Orders:  Orders Placed This Encounter  Procedures  . XR Ankle Complete Left   No orders of the defined types were placed in this encounter.    PMFS History: Patient Active Problem List   Diagnosis Date Noted  . S/P ankle fusion 02/24/2019  . Post-traumatic osteoarthritis of left ankle   . 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 Impingement syndrome of left ankle 03/27/2017  . History of pulmonary embolus (PE) 04/04/2013  . Current smoker 04/04/2013   Past Medical History:  Diagnosis Date  . Allergic rhinitis   . Allergy   . Arthritis   . Complication of anesthesia   . Depression   . DVT (deep venous thrombosis) ([place]) 2010  . Hx of pulmonary embolus   . Impingement syndrome of left ankle   . PONV (postoperative nausea and vomiting)   . Smoker 12/02/07  . Tobacco  use disorder   . 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 COPD Mother   . Hypertension Father     Past Surgical History:  Procedure Laterality Date  . ANKLE ARTHROSCOPY Left   . ANKLE FUSION Left 02/24/2019   Procedure: LEFT OPEN ANKLE FUSION;  Surgeon: Newt Minion, MD;  Location: [facility];  Service: Orthopedics;  Laterality: Left;  . APPENDECTOMY    . HAND SURGERY Left    fracture   Social History   Occupational History  . Not on file  Tobacco Use  . Smoking status: Current Every Day Smoker    Packs/day: 1.00    Years: 21.00    Pack years: 21.00    Types: Cigarettes  . Smokeless tobacco: Former Network engineer and Sexual Activity  . Alcohol use: Yes    Alcohol/week: 2.0 standard drinks    Types: 2 Standard drinks or equivalent per week  . Drug use: No  . Sexual activity: Yes

## 2019-04-22 ENCOUNTER — Ambulatory Visit: Payer: 59 | Admitting: Orthopedic Surgery

## 2019-04-23 ENCOUNTER — Ambulatory Visit: Payer: Self-pay | Admitting: Family Medicine

## 2019-04-29 ENCOUNTER — Ambulatory Visit (INDEPENDENT_AMBULATORY_CARE_PROVIDER_SITE_OTHER): Payer: 59 | Admitting: Orthopedic Surgery

## 2019-04-29 ENCOUNTER — Encounter: Payer: Self-pay | Admitting: Orthopedic Surgery

## 2019-04-29 VITALS — Ht 70.0 in | Wt 140.0 lb

## 2019-04-29 DIAGNOSIS — Z981 Arthrodesis status: Secondary | ICD-10-CM

## 2019-04-30 ENCOUNTER — Encounter: Payer: Self-pay | Admitting: Orthopedic Surgery

## 2019-04-30 NOTE — Progress Notes (Signed)
Office Visit Note   Patient: Gerald Fernandez           Date of Birth: 10-09-1965           MRN: [id number] Visit Date: 04/29/2019              Requested by: Denita Lung, MD [address] [place],  [place] [address] PCP: Denita Lung, MD  Chief Complaint  Patient presents with  . Left Ankle - Routine Post Op    02/24/19 left ankle fusion       HPI: Patient is a 53 year old gentleman who presents 2 months status post left ankle fusion he is currently partial weightbearing with crutches in a fracture boot and compression socks.  He states that he can tolerate some weightbearing with crutches but has soreness after prolonged activities.  He states he has some swelling at the heel.  Assessment & Plan: Visit Diagnoses:  1. S/P ankle fusion     Plan: Patient will continue with increasing his activities as tolerated reevaluate in 4 weeks with repeat radiographs.  Discussed that we will evaluate for return to work once he is independent without his fracture boot.  Follow-Up Instructions: No follow-ups on file.   Ortho Exam  Patient is alert, oriented, no adenopathy, well-dressed, normal affect, normal respiratory effort. Examination patient's foot is plantigrade there is no swelling no cellulitis no signs of infection.  Imaging: No results found. No images are attached to the encounter.  Labs: No results found for: HGBA1C, ESRSEDRATE, CRP, LABURIC, REPTSTATUS, GRAMSTAIN, CULT, LABORGA   Lab Results  Component Value Date   ALBUMIN 4.4 09/03/2018   ALBUMIN 3.1 (L) 04/06/2013   ALBUMIN 3.3 (L) 04/05/2013    No results found for: MG No results found for: VD25OH  No results found for: PREALBUMIN CBC EXTENDED Latest Ref Rng & Units 02/24/2019 09/03/2018 04/06/2013  WBC 4.0 - 10.5 K/uL 9.9 9.2 16.0(H)  RBC 4.22 - 5.81 MIL/uL 4.78 5.27 4.68  HGB 13.0 - 17.0 g/dL 15.3 16.3 14.3  HCT 39.0 - 52.0 % 45.6 47.7 41.6  PLT 150 - 400 K/uL 198 252 178  NEUTROABS 1.4 - 7.0  x10E3/uL - 6.0 -  LYMPHSABS 0.7 - 3.1 x10E3/uL - 2.1 -     Body mass index is 20.09 kg/m.  Orders:  No orders of the defined types were placed in this encounter.  No orders of the defined types were placed in this encounter.    Procedures: No procedures performed  Clinical Data: No additional findings.  ROS:  All other systems negative, except as noted in the HPI. Review of Systems  Objective: Vital Signs: Ht 5\' 10"  (1.778 m)   Wt 140 lb (63.5 kg)   BMI 20.09 kg/m   Specialty Comments:  No specialty comments available.  PMFS History: Patient Active Problem List   Diagnosis Date Noted  . S/P ankle fusion 02/24/2019  . Post-traumatic osteoarthritis of left ankle   . 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 Impingement syndrome of left ankle 03/27/2017  . History of pulmonary embolus (PE) 04/04/2013  . Current smoker 04/04/2013   Past Medical History:  Diagnosis Date  . Allergic rhinitis   . Allergy   . Arthritis   . Complication of anesthesia   . Depression   . DVT (deep venous thrombosis) ([place]) 2010  . Hx of pulmonary embolus   . Impingement syndrome of left ankle   . PONV (postoperative nausea and vomiting)   .  Smoker 12/02/07  . Tobacco use disorder   . 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 COPD Mother   . Hypertension Father     Past Surgical History:  Procedure Laterality Date  . ANKLE ARTHROSCOPY Left   . ANKLE FUSION Left 02/24/2019   Procedure: LEFT OPEN ANKLE FUSION;  Surgeon: Nadara Mustarduda, Marcus V, MD;  [facility];  Service: Orthopedics;  Laterality: Left;  . APPENDECTOMY    . HAND SURGERY Left    fracture   Social History   Occupational History  . Not on file  Tobacco Use  . Smoking status: Current Every Day Smoker    Packs/day: 1.00    Years: 21.00    Pack years: 21.00    Types: Cigarettes  . Smokeless tobacco: Former Engineer, waterUser  Substance and Sexual Activity  .  Alcohol use: Yes    Alcohol/week: 2.0 standard drinks    Types: 2 Standard drinks or equivalent per week  . Drug use: No  . Sexual activity: Yes

## 2019-05-03 ENCOUNTER — Other Ambulatory Visit: Payer: Self-pay

## 2019-05-03 ENCOUNTER — Ambulatory Visit: Payer: 59 | Admitting: Family Medicine

## 2019-05-04 ENCOUNTER — Encounter: Payer: Self-pay | Admitting: Family Medicine

## 2019-05-13 ENCOUNTER — Ambulatory Visit: Payer: 59 | Admitting: Orthopedic Surgery

## 2019-05-19 ENCOUNTER — Encounter: Payer: Self-pay | Admitting: Family

## 2019-05-19 ENCOUNTER — Ambulatory Visit (INDEPENDENT_AMBULATORY_CARE_PROVIDER_SITE_OTHER): Payer: 59 | Admitting: Family

## 2019-05-19 ENCOUNTER — Ambulatory Visit (INDEPENDENT_AMBULATORY_CARE_PROVIDER_SITE_OTHER): Payer: 59

## 2019-05-19 VITALS — Ht 70.0 in | Wt 140.0 lb

## 2019-05-19 DIAGNOSIS — Z981 Arthrodesis status: Secondary | ICD-10-CM

## 2019-05-19 NOTE — Progress Notes (Signed)
Post-Op Visit Note   Patient: Gerald Fernandez           Date of Birth: 03/04/1966           MRN: [id number] Visit Date: 05/19/2019 PCP: Ronnald NianLalonde, John C, MD  Chief Complaint:  Chief Complaint  Patient presents with  . Left Ankle - Routine Post Op    02/24/2019 left open ankle fusion    HPI:  HPI The patient is a 53 year old gentleman status post left open ankle fusion, mini fusion on June 24 of this year.  He has attempted partial to full weightbearing in his cam walker this is painful especially over the course of the day increasing aching pain ready in the day.  Today presents wearing a cam walker using crutches for weightbearing.  Is cam walker unfortunately has broken down and is no longer providing adequate support.  He has no issues with swelling has been using Tylenol and ibuprofen for pain.  However he is not where he had hoped to be is unable to bear weight without pain in his cam walker much less in regular shoewear.  He is not ready to return to work.  Ortho Exam On examination of the left ankle incision is well-healed there is moderate swelling there is no erythema no sign of infection he does have anatomic alignment of the ankle there is no tenderness to palpation  Visit Diagnoses:  1. S/P ankle fusion     Plan: Discussed weightbearing as tolerated in the cam walker will provide a new cam walker today.  If he is having any pain at all this is a sign he has not healed enough to be weightbearing in his cam.  We will hold off on returning to work.  Have provided a note today.  He will follow-up in the office in 4 weeks with repeat radiographs.  Continue with current activity.  Follow-Up Instructions: Return in about 29 days (around 06/17/2019).   Imaging: No results found.  Orders:  Orders Placed This Encounter  Procedures  . XR Ankle Complete Left   No orders of the defined types were placed in this encounter.    PMFS History: Patient Active Problem List   Diagnosis Date Noted  . S/P ankle fusion 02/24/2019  . Post-traumatic osteoarthritis of left ankle   . 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 Impingement syndrome of left ankle 03/27/2017  . History of pulmonary embolus (PE) 04/04/2013  . Current smoker 04/04/2013   Past Medical History:  Diagnosis Date  . Allergic rhinitis   . Allergy   . Arthritis   . Complication of anesthesia   . Depression   . DVT (deep venous thrombosis) ([facility]) 2010  . Hx of pulmonary embolus   . Impingement syndrome of left ankle   . PONV (postoperative nausea and vomiting)   . Smoker 12/02/07  . Tobacco use disorder   . 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 COPD Mother   . Hypertension Father     Past Surgical History:  Procedure Laterality Date  . ANKLE ARTHROSCOPY Left   . ANKLE FUSION Left 02/24/2019   Procedure: LEFT OPEN ANKLE FUSION;  Surgeon: Nadara Mustarduda, Marcus V, MD;  [facility];  Service: Orthopedics;  Laterality: Left;  . APPENDECTOMY    . HAND SURGERY Left    fracture   Social History   Occupational History  . Not on file  Tobacco Use  .  Smoking status: Current Every Day Smoker    Packs/day: 1.00    Years: 21.00    Pack years: 21.00    Types: Cigarettes  . Smokeless tobacco: Former Network engineer and Sexual Activity  . Alcohol use: Yes    Alcohol/week: 2.0 standard drinks    Types: 2 Standard drinks or equivalent per week  . Drug use: No  . Sexual activity: Yes

## 2019-06-04 ENCOUNTER — Other Ambulatory Visit: Payer: Self-pay | Admitting: Family Medicine

## 2019-06-04 DIAGNOSIS — F341 Dysthymic disorder: Secondary | ICD-10-CM

## 2019-06-04 NOTE — Telephone Encounter (Signed)
[facility] is requesting to fill pt wellbutrin. Please advise [facility] 

## 2019-06-17 ENCOUNTER — Ambulatory Visit (INDEPENDENT_AMBULATORY_CARE_PROVIDER_SITE_OTHER): Payer: 59

## 2019-06-17 ENCOUNTER — Other Ambulatory Visit: Payer: Self-pay

## 2019-06-17 ENCOUNTER — Encounter: Payer: Self-pay | Admitting: Orthopedic Surgery

## 2019-06-17 ENCOUNTER — Ambulatory Visit (INDEPENDENT_AMBULATORY_CARE_PROVIDER_SITE_OTHER): Payer: 59 | Admitting: Orthopedic Surgery

## 2019-06-17 VITALS — Ht 70.0 in | Wt 140.0 lb

## 2019-06-17 DIAGNOSIS — Z981 Arthrodesis status: Secondary | ICD-10-CM | POA: Diagnosis not present

## 2019-06-17 DIAGNOSIS — M19172 Post-traumatic osteoarthritis, left ankle and foot: Secondary | ICD-10-CM

## 2019-06-18 ENCOUNTER — Encounter: Payer: Self-pay | Admitting: Orthopedic Surgery

## 2019-06-18 NOTE — Progress Notes (Signed)
Office Visit Note   Patient: Gerald Fernandez           Date of Birth: 1966-05-07           MRN: [id number] Visit Date: 06/17/2019              Requested by: Denita Lung, MD [address] [place],  [place] [address] PCP: Denita Lung, MD  Chief Complaint  Patient presents with  . Left Ankle - Follow-up      HPI: Patient is a 53 year old gentleman who is 3-1/2 months status post left tibial talar fusion.  Patient has been out of work for [employer].  Patient is required to be on his feet for work as he works in a [employer].  Assessment & Plan: Visit Diagnoses:  1. Post-traumatic arthritis of left ankle   2. S/P ankle fusion     Plan: Patient will wean out of the fracture boot as he feels comfortable.  Patient is not able to return to standing work and he was given a note that he may return to seated light duty work.  Patient does not feel that this type of work would be available for him.  Follow-Up Instructions: Return in about 4 weeks (around 07/15/2019).   Ortho Exam  Patient is alert, oriented, no adenopathy, well-dressed, normal affect, normal respiratory effort. Examination patient's foot is plantigrade there is no redness no cellulitis no signs of infection radiographs shows a stable fusion.  Imaging: No results found. No images are attached to the encounter.  Labs: No results found for: HGBA1C, ESRSEDRATE, CRP, LABURIC, REPTSTATUS, GRAMSTAIN, CULT, LABORGA   Lab Results  Component Value Date   ALBUMIN 4.4 09/03/2018   ALBUMIN 3.1 (L) 04/06/2013   ALBUMIN 3.3 (L) 04/05/2013    No results found for: MG No results found for: VD25OH  No results found for: PREALBUMIN CBC EXTENDED Latest Ref Rng & Units 02/24/2019 09/03/2018 04/06/2013  WBC 4.0 - 10.5 K/uL 9.9 9.2 16.0(H)  RBC 4.22 - 5.81 MIL/uL 4.78 5.27 4.68  HGB 13.0 - 17.0 g/dL 15.3 16.3 14.3  HCT 39.0 - 52.0 % 45.6 47.7 41.6  PLT 150 - 400 K/uL 198 252 178  NEUTROABS 1.4 - 7.0 x10E3/uL - 6.0  -  LYMPHSABS 0.7 - 3.1 x10E3/uL - 2.1 -     Body mass index is 20.09 kg/m.  Orders:  Orders Placed This Encounter  Procedures  . XR Ankle Complete Left   No orders of the defined types were placed in this encounter.    Procedures: No procedures performed  Clinical Data: No additional findings.  ROS:  All other systems negative, except as noted in the HPI. Review of Systems  Objective: Vital Signs: Ht 5\' 10"  (1.778 m)   Wt 140 lb (63.5 kg)   BMI 20.09 kg/m   Specialty Comments:  No specialty comments available.  PMFS History: Patient Active Problem List   Diagnosis Date Noted  . S/P ankle fusion 02/24/2019  . Post-traumatic osteoarthritis of left ankle   . 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 Impingement syndrome of left ankle 03/27/2017  . History of pulmonary embolus (PE) 04/04/2013  . Current smoker 04/04/2013   Past Medical History:  Diagnosis Date  . Allergic rhinitis   . Allergy   . Arthritis   . Complication of anesthesia   . Depression   . DVT (deep venous thrombosis) ([facility]) 2010  . Hx of pulmonary embolus   . Impingement syndrome of  left ankle   . PONV (postoperative nausea and vomiting)   . Smoker 12/02/07  . Tobacco use disorder   . 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 COPD Mother   . Hypertension Father     Past Surgical History:  Procedure Laterality Date  . ANKLE ARTHROSCOPY Left   . ANKLE FUSION Left 02/24/2019   Procedure: LEFT OPEN ANKLE FUSION;  Surgeon: Nadara Mustard, MD;  Location: [facility];  Service: Orthopedics;  Laterality: Left;  . APPENDECTOMY    . HAND SURGERY Left    fracture   Social History   Occupational History  . Not on file  Tobacco Use  . Smoking status: Current Every Day Smoker    Packs/day: 1.00    Years: 21.00    Pack years: 21.00    Types: Cigarettes  . Smokeless tobacco: Former Engineer, water and Sexual Activity  . Alcohol  use: Yes    Alcohol/week: 2.0 standard drinks    Types: 2 Standard drinks or equivalent per week  . Drug use: No  . Sexual activity: Yes

## 2019-07-15 ENCOUNTER — Other Ambulatory Visit: Payer: Self-pay

## 2019-07-15 ENCOUNTER — Ambulatory Visit (INDEPENDENT_AMBULATORY_CARE_PROVIDER_SITE_OTHER): Payer: 59 | Admitting: Orthopedic Surgery

## 2019-07-15 ENCOUNTER — Encounter: Payer: Self-pay | Admitting: Orthopedic Surgery

## 2019-07-15 ENCOUNTER — Ambulatory Visit (INDEPENDENT_AMBULATORY_CARE_PROVIDER_SITE_OTHER): Payer: 59

## 2019-07-15 VITALS — Ht 70.0 in | Wt 140.0 lb

## 2019-07-15 DIAGNOSIS — M19172 Post-traumatic osteoarthritis, left ankle and foot: Secondary | ICD-10-CM | POA: Diagnosis not present

## 2019-07-15 NOTE — Progress Notes (Signed)
Office Visit Note   Patient: Gerald Fernandez           Date of Birth: 05-05-1966           MRN: [id number] Visit Date: 07/15/2019              Requested by: Denita Lung, MD [address] [place],  [place] [address] PCP: Denita Lung, MD  Chief Complaint  Patient presents with  . Left Ankle - Follow-up    02/24/19 left tibial talar fusion       HPI: Patient is a 53 year old gentleman who is 4-1/2 months status post left tibial talar fusion.  Patient is currently in regular shoes.  He states he has some swelling and pain across the midfoot as well as over the tendons at the ankle after he has been ambulating for 3 or 4 hours.  Assessment & Plan: Visit Diagnoses:  1. Post-traumatic arthritis of left ankle     Plan: Patient was given instructions to get a carbon plate to unload his midfoot also a cork orthotic such as sole and a cushioned sneaker such as Hoka.  We also discussed plantar fascial strengthening with toe raises to improve the strength through the foot and ankle.  Patient was given a note to return to work on November 30.  Follow-Up Instructions: Return if symptoms worsen or fail to improve.   Ortho Exam  Patient is alert, oriented, no adenopathy, well-dressed, normal affect, normal respiratory effort. Examination patient has good subtalar and midfoot range of motion no pain with range of motion passively.  Patient has no tenderness to palpation over the peroneal or posterior tibial tendon but he states this is where he is sore after prolonged activities.  The deep and superficial peroneal nerves are intact on the dorsum of the foot patient does have some neuropathy pain with palpation dorsally over the ankle.  Imaging: Xr Ankle Complete Left  Result Date: 07/15/2019 Three-view radiographs of the left ankle shows stable anterior plate fixation of the left ankle fusion no hardware failure stable fusion patient has good maintenance of the posterior facet of  the subtalar joint the talonavicular joint is intact there is some joint space narrowing across the Lisfranc complex.  No images are attached to the encounter.  Labs: No results found for: HGBA1C, ESRSEDRATE, CRP, LABURIC, REPTSTATUS, GRAMSTAIN, CULT, LABORGA   Lab Results  Component Value Date   ALBUMIN 4.4 09/03/2018   ALBUMIN 3.1 (L) 04/06/2013   ALBUMIN 3.3 (L) 04/05/2013    No results found for: MG No results found for: VD25OH  No results found for: PREALBUMIN CBC EXTENDED Latest Ref Rng & Units 02/24/2019 09/03/2018 04/06/2013  WBC 4.0 - 10.5 K/uL 9.9 9.2 16.0(H)  RBC 4.22 - 5.81 MIL/uL 4.78 5.27 4.68  HGB 13.0 - 17.0 g/dL 15.3 16.3 14.3  HCT 39.0 - 52.0 % 45.6 47.7 41.6  PLT 150 - 400 K/uL 198 252 178  NEUTROABS 1.4 - 7.0 x10E3/uL - 6.0 -  LYMPHSABS 0.7 - 3.1 x10E3/uL - 2.1 -     Body mass index is 20.09 kg/m.  Orders:  Orders Placed This Encounter  Procedures  . XR Ankle Complete Left   No orders of the defined types were placed in this encounter.    Procedures: No procedures performed  Clinical Data: No additional findings.  ROS:  All other systems negative, except as noted in the HPI. Review of Systems  Objective: Vital Signs: Ht 5\' 10"  (1.778 m)  Wt 140 lb (63.5 kg)   BMI 20.09 kg/m   Specialty Comments:  No specialty comments available.  PMFS History: Patient Active Problem List   Diagnosis Date Noted  . S/P ankle fusion 02/24/2019  . Post-traumatic osteoarthritis of left ankle   . 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 Impingement syndrome of left ankle 03/27/2017  . History of pulmonary embolus (PE) 04/04/2013  . Current smoker 04/04/2013   Past Medical History:  Diagnosis Date  . Allergic rhinitis   . Allergy   . Arthritis   . Complication of anesthesia   . Depression   . DVT (deep venous thrombosis) ([facility]) 2010  . Hx of pulmonary embolus   . Impingement syndrome of left ankle   . PONV (postoperative nausea and  vomiting)   . Smoker 12/02/07  . Tobacco use disorder   . 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 COPD Mother   . Hypertension Father     Past Surgical History:  Procedure Laterality Date  . ANKLE ARTHROSCOPY Left   . ANKLE FUSION Left 02/24/2019   Procedure: LEFT OPEN ANKLE FUSION;  Surgeon: Nadara Mustard, MD;  Location: [facility] OR;  Service: Orthopedics;  Laterality: Left;  . APPENDECTOMY    . HAND SURGERY Left    fracture   Social History   Occupational History  . Not on file  Tobacco Use  . Smoking status: Current Every Day Smoker    Packs/day: 1.00    Years: 21.00    Pack years: 21.00    Types: Cigarettes  . Smokeless tobacco: Former Engineer, water and Sexual Activity  . Alcohol use: Yes    Alcohol/week: 2.0 standard drinks    Types: 2 Standard drinks or equivalent per week  . Drug use: No  . Sexual activity: Yes

## 2019-08-03 ENCOUNTER — Telehealth: Payer: Self-pay | Admitting: Family Medicine

## 2019-08-03 NOTE — Telephone Encounter (Signed)
Dismissal letter in guarantor snapshot  °

## 2019-10-11 ENCOUNTER — Other Ambulatory Visit: Payer: Self-pay

## 2019-10-11 ENCOUNTER — Ambulatory Visit (INDEPENDENT_AMBULATORY_CARE_PROVIDER_SITE_OTHER): Payer: 59 | Admitting: Orthopedic Surgery

## 2019-10-11 ENCOUNTER — Encounter: Payer: Self-pay | Admitting: Orthopedic Surgery

## 2019-10-11 VITALS — Ht 70.0 in | Wt 140.0 lb

## 2019-10-11 DIAGNOSIS — Z981 Arthrodesis status: Secondary | ICD-10-CM | POA: Diagnosis not present

## 2019-10-11 NOTE — Progress Notes (Signed)
Office Visit Note   Patient: Gerald Fernandez           Date of Birth: April 17, 1966           MRN: [id number] Visit Date: 10/11/2019              Requested by: No referring provider defined for this encounter. PCP: Patient, No Pcp Per  Chief Complaint  Patient presents with  . Left Ankle - Follow-up    02/24/19 left tibial calcaneal fusion       HPI: This is a pleasant 54 year old gentleman who is 7 months status post left tibial talar fusion.  Overall he is doing quite well and feels much better than prior to surgery.  His biggest complaint is his heel pain with prolonged weightbearing.  He also palpate the top of the plate which is sometimes uncomfortable to him.  He is returned to his activities.  He has investigated a pair of Hoka shoes as well as orthotics  Assessment & Plan: Visit Diagnoses: No diagnosis found.  Plan: He may return he continue to return to activities we talked about continuing to have good orthotics.  He does have a lot of atrophy in the heel and a gel heel pad may be helpful to him.  He will follow-up in 5 months at the 1 year mark after the surgery x-rays of his ankle should be obtained  Follow-Up Instructions: No follow-ups on file.   Ortho Exam  Patient is alert, oriented, no adenopathy, well-dressed, normal affect, normal respiratory effort. Focused examination of his left ankle demonstrates well-healed surgical incision.  He does have a lot of calf atrophy and the plate is palpable proximally.  There is no surrounding erythema or skin compromise.  He does have quite a bit of fat pad atrophy in the plantar surface of his heel.  Ankle range of motion is rigid  Imaging: No results found. No images are attached to the encounter.  Labs: No results found for: HGBA1C, ESRSEDRATE, CRP, LABURIC, REPTSTATUS, GRAMSTAIN, CULT, LABORGA   Lab Results  Component Value Date   ALBUMIN 4.4 09/03/2018   ALBUMIN 3.1 (L) 04/06/2013   ALBUMIN 3.3 (L) 04/05/2013     No results found for: MG No results found for: VD25OH  No results found for: PREALBUMIN CBC EXTENDED Latest Ref Rng & Units 02/24/2019 09/03/2018 04/06/2013  WBC 4.0 - 10.5 K/uL 9.9 9.2 16.0(H)  RBC 4.22 - 5.81 MIL/uL 4.78 5.27 4.68  HGB 13.0 - 17.0 g/dL 78.2 95.6 21.3  HCT 08.6 - 52.0 % 45.6 47.7 41.6  PLT 150 - 400 K/uL 198 252 178  NEUTROABS 1.4 - 7.0 x10E3/uL - 6.0 -  LYMPHSABS 0.7 - 3.1 x10E3/uL - 2.1 -     Body mass index is 20.09 kg/m.  Orders:  No orders of the defined types were placed in this encounter.  No orders of the defined types were placed in this encounter.    Procedures: No procedures performed  Clinical Data: No additional findings.  ROS:  All other systems negative, except as noted in the HPI. Review of Systems  Objective: Vital Signs: Ht 5\' 10"  (1.778 m)   Wt 140 lb (63.5 kg)   BMI 20.09 kg/m   Specialty Comments:  No specialty comments available.  PMFS History: Patient Active Problem List   Diagnosis Date Noted  . S/P ankle fusion 02/24/2019  . Post-traumatic osteoarthritis of left ankle   . 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 Impingement  syndrome of left ankle 03/27/2017  . History of pulmonary embolus (PE) 04/04/2013  . Current smoker 04/04/2013   Past Medical History:  Diagnosis Date  . Allergic rhinitis   . Allergy   . Arthritis   . Complication of anesthesia   . Depression   . DVT (deep venous thrombosis) ([facility]) 2010  . Hx of pulmonary embolus   . Impingement syndrome of left ankle   . PONV (postoperative nausea and vomiting)   . Smoker 12/02/07  . Tobacco use disorder   . 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 COPD Mother   . Hypertension Father     Past Surgical History:  Procedure Laterality Date  . ANKLE ARTHROSCOPY Left   . ANKLE FUSION Left 02/24/2019   Procedure: LEFT OPEN ANKLE FUSION;  Surgeon: Newt Minion, MD;  Location: [facility];   Service: Orthopedics;  Laterality: Left;  . APPENDECTOMY    . HAND SURGERY Left    fracture   Social History   Occupational History  . Not on file  Tobacco Use  . Smoking status: Current Every Day Smoker    Packs/day: 1.00    Years: 21.00    Pack years: 21.00    Types: Cigarettes  . Smokeless tobacco: Former Network engineer and Sexual Activity  . Alcohol use: Yes    Alcohol/week: 2.0 standard drinks    Types: 2 Standard drinks or equivalent per week  . Drug use: No  . Sexual activity: Yes

## 2019-12-23 ENCOUNTER — Other Ambulatory Visit: Payer: Self-pay

## 2019-12-23 ENCOUNTER — Encounter: Payer: Self-pay | Admitting: Orthopedic Surgery

## 2019-12-23 ENCOUNTER — Ambulatory Visit: Payer: Self-pay

## 2019-12-23 ENCOUNTER — Ambulatory Visit (INDEPENDENT_AMBULATORY_CARE_PROVIDER_SITE_OTHER): Payer: 59 | Admitting: Orthopedic Surgery

## 2019-12-23 VITALS — Ht 70.0 in | Wt 140.0 lb

## 2019-12-23 DIAGNOSIS — M19072 Primary osteoarthritis, left ankle and foot: Secondary | ICD-10-CM

## 2019-12-23 DIAGNOSIS — M19172 Post-traumatic osteoarthritis, left ankle and foot: Secondary | ICD-10-CM | POA: Diagnosis not present

## 2019-12-27 ENCOUNTER — Encounter: Payer: Self-pay | Admitting: Orthopedic Surgery

## 2019-12-27 ENCOUNTER — Other Ambulatory Visit: Payer: Self-pay

## 2019-12-27 ENCOUNTER — Telehealth: Payer: Self-pay | Admitting: Orthopedic Surgery

## 2019-12-27 DIAGNOSIS — M19172 Post-traumatic osteoarthritis, left ankle and foot: Secondary | ICD-10-CM | POA: Diagnosis not present

## 2019-12-27 DIAGNOSIS — M19072 Primary osteoarthritis, left ankle and foot: Secondary | ICD-10-CM

## 2019-12-27 MED ORDER — METHYLPREDNISOLONE ACETATE 40 MG/ML IJ SUSP
40.0000 mg | INTRAMUSCULAR | Status: AC | PRN
  Administered 2019-12-27: 40 mg via INTRA_ARTICULAR

## 2019-12-27 MED ORDER — LIDOCAINE HCL 1 % IJ SOLN
1.0000 mL | INTRAMUSCULAR | Status: AC | PRN
  Administered 2019-12-27: 1 mL

## 2019-12-27 NOTE — Telephone Encounter (Signed)
Patient called stating that after the injection he is back to the original pain he had before the injection.  He is wanting to know what his options are.  CB#[phone].  Thank you.

## 2019-12-27 NOTE — Progress Notes (Signed)
Office Visit Note   Patient: Gerald Fernandez           Date of Birth: 10/27/1965           MRN: [id number] Visit Date: 12/23/2019              Requested by: No referring provider defined for this encounter. PCP: Patient, No Pcp Per  Chief Complaint  Patient presents with  . Left Ankle - Follow-up    02/24/19 left tibial calcaneal fusion       HPI: Patient is a 54 year old gentleman who presents almost a year out from a left tibial talar fusion.  Patient states he has been having pain beneath the ankle for the past 3 to 4 days which he states is very severe worse with changes in weather difficulty walking pain over the anterior lateral aspect of his foot.  Patient states he did step on some metal at work about a week ago but did not sustain any punctures to his foot.   Assessment & Plan: Visit Diagnoses:  1. Post-traumatic arthritis of left ankle   2. Arthritis of left subtalar joint     Plan: Recommended a supportive boot to minimize subtalar motion.  Discussed that we could proceed with conservative treatment however a subtalar fusion may be an option.  Follow-Up Instructions: Return if symptoms worsen or fail to improve.   Ortho Exam  Patient is alert, oriented, no adenopathy, well-dressed, normal affect, normal respiratory effort. Examination patient has pain to palpation of the left sinus Tarsi.  Patient has only about 10 degrees of inversion and eversion of the subtalar joint on the left he has good range of motion on the right subtalar joint.  Patient has palpable pulses.  Imaging: No results found. No images are attached to the encounter.  Labs: No results found for: HGBA1C, ESRSEDRATE, CRP, LABURIC, REPTSTATUS, GRAMSTAIN, CULT, LABORGA   Lab Results  Component Value Date   ALBUMIN 4.4 09/03/2018   ALBUMIN 3.1 (L) 04/06/2013   ALBUMIN 3.3 (L) 04/05/2013    No results found for: MG No results found for: VD25OH  No results found for: PREALBUMIN CBC EXTENDED  Latest Ref Rng & Units 02/24/2019 09/03/2018 04/06/2013  WBC 4.0 - 10.5 K/uL 9.9 9.2 16.0(H)  RBC 4.22 - 5.81 MIL/uL 4.78 5.27 4.68  HGB 13.0 - 17.0 g/dL 15.3 16.3 14.3  HCT 39.0 - 52.0 % 45.6 47.7 41.6  PLT 150 - 400 K/uL 198 252 178  NEUTROABS 1.4 - 7.0 x10E3/uL - 6.0 -  LYMPHSABS 0.7 - 3.1 x10E3/uL - 2.1 -     Body mass index is 20.09 kg/m.  Orders:  Orders Placed This Encounter  Procedures  . Small Joint Inj: L subtalar  . XR Ankle Complete Left   No orders of the defined types were placed in this encounter.    Procedures: Small Joint Inj: L subtalar on 12/27/2019 12:48 PM Indications: pain and diagnostic evaluation Details: 22 G needle, dorsal approach  Spinal Needle: No  Medications: 1 mL lidocaine 1 %; 40 mg methylPREDNISolone acetate 40 MG/ML Outcome: tolerated well, no immediate complications Procedure, treatment alternatives, risks and benefits explained, specific risks discussed. Consent was given by the patient. Immediately prior to procedure a time out was called to verify the correct patient, procedure, equipment, support staff and site/side marked as required. Patient was prepped and draped in the usual sterile fashion.      Clinical Data: No additional findings.  ROS:  All other  systems negative, except as noted in the HPI. Review of Systems  Objective: Vital Signs: Ht 5\' 10"  (1.778 m)   Wt 140 lb (63.5 kg)   BMI 20.09 kg/m   Specialty Comments:  No specialty comments available.  PMFS History: Patient Active Problem List   Diagnosis Date Noted  . S/P ankle fusion 02/24/2019  . Post-traumatic osteoarthritis of left ankle   . 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 Impingement syndrome of left ankle 03/27/2017  . History of pulmonary embolus (PE) 04/04/2013  . Current smoker 04/04/2013   Past Medical History:  Diagnosis Date  . Allergic rhinitis   . Allergy   . Arthritis   . Complication of anesthesia   . Depression   . DVT  (deep venous thrombosis) ([facility]) 2010  . Hx of pulmonary embolus   . Impingement syndrome of left ankle   . PONV (postoperative nausea and vomiting)   . Smoker 12/02/07  . Tobacco use disorder   . 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 COPD Mother   . Hypertension Father     Past Surgical History:  Procedure Laterality Date  . ANKLE ARTHROSCOPY Left   . ANKLE FUSION Left 02/24/2019   Procedure: LEFT OPEN ANKLE FUSION;  Surgeon: 02/26/2019, MD;  Location: [facility] OR;  Service: Orthopedics;  Laterality: Left;  . APPENDECTOMY    . HAND SURGERY Left    fracture   Social History   Occupational History  . Not on file  Tobacco Use  . Smoking status: Current Every Day Smoker    Packs/day: 1.00    Years: 21.00    Pack years: 21.00    Types: Cigarettes  . Smokeless tobacco: Former [employer] and Sexual Activity  . Alcohol use: Yes    Alcohol/week: 2.0 standard drinks    Types: 2 Standard drinks or equivalent per week  . Drug use: No  . Sexual activity: Yes

## 2019-12-28 NOTE — Telephone Encounter (Signed)
I called and lm on vm to advise pt that the order for CT scan has been placed to eval for subtalar arthritis and that once the results are available that an appt will be made to review and discuss if possible further surgical intervention may be required per Dr. Lajoyce Corners.

## 2019-12-30 ENCOUNTER — Other Ambulatory Visit: Payer: Self-pay | Admitting: Orthopedic Surgery

## 2019-12-30 ENCOUNTER — Telehealth: Payer: Self-pay | Admitting: Orthopedic Surgery

## 2019-12-30 MED ORDER — OXYCODONE-ACETAMINOPHEN 5-325 MG PO TABS: 1.0000 | ORAL_TABLET | Freq: Four times a day (QID) | ORAL | 0 refills | Status: DC | PRN

## 2019-12-30 NOTE — Telephone Encounter (Signed)
I sent the rx to his pharmacy

## 2019-12-30 NOTE — Telephone Encounter (Signed)
Pt is pending CT scan of the ankle and is requesting rx for pain medication.

## 2019-12-30 NOTE — Telephone Encounter (Signed)
I called and sw pt to advise. CT sch for 01/14/20

## 2019-12-30 NOTE — Telephone Encounter (Signed)
Patient called this morning stating that the injection he received in his left ankle only helped until Sunday morning.  Patient is taking 20-25 Advil a day, which is not helping his pain.  Patient wanted to know if Dr. Lajoyce Corners would send in an RX for pain medication.  Patient uses [facility] on [address]  CB#[phone].  He would like a call back when the medication is sent in.  Thank you.

## 2020-01-10 ENCOUNTER — Ambulatory Visit: Payer: 59 | Admitting: Orthopedic Surgery

## 2020-01-10 ENCOUNTER — Telehealth: Payer: Self-pay | Admitting: Orthopedic Surgery

## 2020-01-10 NOTE — Telephone Encounter (Signed)
Patient called.   He is requesting a refill on his oxycodone.   Call back: [phone]

## 2020-01-10 NOTE — Telephone Encounter (Signed)
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

## 2020-01-11 ENCOUNTER — Other Ambulatory Visit: Payer: Self-pay | Admitting: Orthopedic Surgery

## 2020-01-11 MED ORDER — OXYCODONE-ACETAMINOPHEN 5-325 MG PO TABS: 1.0000 | ORAL_TABLET | Freq: Four times a day (QID) | ORAL | 0 refills | Status: DC | PRN

## 2020-01-11 NOTE — Telephone Encounter (Signed)
Rx sent 

## 2020-01-14 ENCOUNTER — Ambulatory Visit
Admission: RE | Admit: 2020-01-14 | Discharge: 2020-01-14 | Disposition: A | Discharge status: Home / Self Care | Payer: 59 | Source: Ambulatory Visit | Attending: Orthopedic Surgery | Admitting: Orthopedic Surgery

## 2020-01-14 ENCOUNTER — Other Ambulatory Visit: Payer: Self-pay

## 2020-01-14 DIAGNOSIS — M19072 Primary osteoarthritis, left ankle and foot: Secondary | ICD-10-CM

## 2020-01-14 DIAGNOSIS — M19172 Post-traumatic osteoarthritis, left ankle and foot: Secondary | ICD-10-CM

## 2020-01-17 ENCOUNTER — Other Ambulatory Visit: Payer: Self-pay

## 2020-01-17 ENCOUNTER — Encounter: Payer: Self-pay | Admitting: Orthopedic Surgery

## 2020-01-17 ENCOUNTER — Ambulatory Visit (INDEPENDENT_AMBULATORY_CARE_PROVIDER_SITE_OTHER): Payer: 59 | Admitting: Orthopedic Surgery

## 2020-01-17 VITALS — Ht 70.0 in | Wt 140.0 lb

## 2020-01-17 DIAGNOSIS — S92135D Nondisplaced fracture of posterior process of left talus, subsequent encounter for fracture with routine healing: Secondary | ICD-10-CM | POA: Diagnosis not present

## 2020-01-17 NOTE — Progress Notes (Signed)
Office Visit Note   Patient: Gerald Fernandez           Date of Birth: 08-Aug-1966           MRN: [id number] Visit Date: 01/17/2020              Requested by: No referring provider defined for this encounter. PCP: Patient, No Pcp Per  Chief Complaint  Patient presents with  . Left Ankle - Follow-up    CT Review      HPI: Patient is a 54 year old gentleman who presents with follow-up for his left ankle he has pain laterally.  He states that he can work during the day but has pain at night.  He is status post CT scan.  Assessment & Plan: Visit Diagnoses:  1. Closed nondisplaced fracture of posterior process of left talus with routine healing, subsequent encounter     Plan: We will continue with conservative therapy recommended vitamin D3 4000 to 5000 international units a day with calcium.  Recommended modifying activities when he is symptomatic.  Discussed that this should resolve on its own the tibial talar fusion is stable he has no subtalar pathology.  Follow-Up Instructions: Return if symptoms worsen or fail to improve.   Ortho Exam  Patient is alert, oriented, no adenopathy, well-dressed, normal affect, normal respiratory effort. Examination patient has no redness no cellulitis no signs of infection.  Review of the radiographs shows a stable tibial talar fusion with a vertical oriented fracture through the lateral talar body extending into the posterior facet.  There is no subtalar pathology.  Imaging: No results found. No images are attached to the encounter.  Labs: No results found for: HGBA1C, ESRSEDRATE, CRP, LABURIC, REPTSTATUS, GRAMSTAIN, CULT, LABORGA   Lab Results  Component Value Date   ALBUMIN 4.4 09/03/2018   ALBUMIN 3.1 (L) 04/06/2013   ALBUMIN 3.3 (L) 04/05/2013    No results found for: MG No results found for: VD25OH  No results found for: PREALBUMIN CBC EXTENDED Latest Ref Rng & Units 02/24/2019 09/03/2018 04/06/2013  WBC 4.0 - 10.5 K/uL 9.9 9.2  16.0(H)  RBC 4.22 - 5.81 MIL/uL 4.78 5.27 4.68  HGB 13.0 - 17.0 g/dL 15.3 16.3 14.3  HCT 39.0 - 52.0 % 45.6 47.7 41.6  PLT 150 - 400 K/uL 198 252 178  NEUTROABS 1.4 - 7.0 x10E3/uL - 6.0 -  LYMPHSABS 0.7 - 3.1 x10E3/uL - 2.1 -     Body mass index is 20.09 kg/m.  Orders:  No orders of the defined types were placed in this encounter.  No orders of the defined types were placed in this encounter.    Procedures: No procedures performed  Clinical Data: No additional findings.  ROS:  All other systems negative, except as noted in the HPI. Review of Systems  Objective: Vital Signs: Ht 5\' 10"  (1.778 m)   Wt 140 lb (63.5 kg)   BMI 20.09 kg/m   Specialty Comments:  No specialty comments available.  PMFS History: Patient Active Problem List   Diagnosis Date Noted  . S/P ankle fusion 02/24/2019  . Post-traumatic osteoarthritis of left ankle   . 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 Impingement syndrome of left ankle 03/27/2017  . History of pulmonary embolus (PE) 04/04/2013  . Current smoker 04/04/2013   Past Medical History:  Diagnosis Date  . Allergic rhinitis   . Allergy   . Arthritis   . Complication of anesthesia   . Depression   . DVT (  deep venous thrombosis) ([facility]) 2010  . Hx of pulmonary embolus   . Impingement syndrome of left ankle   . PONV (postoperative nausea and vomiting)   . Smoker 12/02/07  . Tobacco use disorder   . 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 COPD Mother   . Hypertension Father     Past Surgical History:  Procedure Laterality Date  . ANKLE ARTHROSCOPY Left   . ANKLE FUSION Left 02/24/2019   Procedure: LEFT OPEN ANKLE FUSION;  Surgeon: Nadara Mustard, MD;  Location: [facility] OR;  Service: Orthopedics;  Laterality: Left;  . APPENDECTOMY    . HAND SURGERY Left    fracture   Social History   Occupational History  . Not on file  Tobacco Use  . Smoking status:  Current Every Day Smoker    Packs/day: 1.00    Years: 21.00    Pack years: 21.00    Types: Cigarettes  . Smokeless tobacco: Former Engineer, water and Sexual Activity  . Alcohol use: Yes    Alcohol/week: 2.0 standard drinks    Types: 2 Standard drinks or equivalent per week  . Drug use: No  . Sexual activity: Yes

## 2020-01-21 ENCOUNTER — Other Ambulatory Visit: Payer: Self-pay | Admitting: Orthopedic Surgery

## 2020-01-21 ENCOUNTER — Telehealth: Payer: Self-pay | Admitting: Orthopedic Surgery

## 2020-01-21 MED ORDER — OXYCODONE-ACETAMINOPHEN 5-325 MG PO TABS: 1.0000 | ORAL_TABLET | Freq: Three times a day (TID) | ORAL | 0 refills | Status: DC | PRN

## 2020-01-21 NOTE — Telephone Encounter (Signed)
I called and sw pt to advise. To call with questions.

## 2020-01-21 NOTE — Telephone Encounter (Signed)
Patient called requesting a refill of oxycodone. Please send to pharmacy on file. Patient phone number is [phone].

## 2020-01-21 NOTE — Telephone Encounter (Signed)
Pt is calling for a refill on his oxycodone 5/325 last refill was on 01/11/20 #20 please advise.

## 2020-01-21 NOTE — Telephone Encounter (Signed)
Rx sent 10. If he is having pain he needs to be off his foot

## 2020-02-03 ENCOUNTER — Other Ambulatory Visit: Payer: Self-pay | Admitting: Orthopedic Surgery

## 2020-02-03 ENCOUNTER — Telehealth: Payer: Self-pay | Admitting: Orthopedic Surgery

## 2020-02-03 MED ORDER — OXYCODONE-ACETAMINOPHEN 5-325 MG PO TABS: 1.0000 | ORAL_TABLET | Freq: Three times a day (TID) | ORAL | 0 refills | Status: DC | PRN

## 2020-02-03 NOTE — Telephone Encounter (Signed)
Patient called.   He needs a refill on his oxycodone.   Call back: [phone]

## 2020-02-03 NOTE — Telephone Encounter (Signed)
02/24/19 open ankle fusion pt is requesting refill on pain medication. Last refills were 01/21/20 Oxycodone 5/325 #20 and 01/11/20 #20 please advise.

## 2020-02-03 NOTE — Telephone Encounter (Signed)
Rx sent 

## 2020-02-11 ENCOUNTER — Telehealth: Payer: Self-pay | Admitting: Orthopedic Surgery

## 2020-02-11 NOTE — Telephone Encounter (Signed)
Spoke with patient and read note in chart to him concerning making an appointment to come in   (Rx refill (Oxycodone)    Patient said he will call back to make the appointment to be seen in the office

## 2020-02-11 NOTE — Telephone Encounter (Signed)
I called pt and there was no answer and vm box has not been setup. I will hold message and try again. Pt needs to make follow up appt. Last refill was 02/03/20 #20

## 2020-02-11 NOTE — Telephone Encounter (Signed)
Pt called and lm that he will call to make and appt.

## 2020-02-11 NOTE — Telephone Encounter (Signed)
Noted  

## 2020-02-11 NOTE — Telephone Encounter (Signed)
Pt requested a refill of his oxycodone medication.   [phone]

## 2020-02-15 ENCOUNTER — Encounter: Payer: Self-pay | Admitting: Orthopedic Surgery

## 2020-02-15 ENCOUNTER — Ambulatory Visit (INDEPENDENT_AMBULATORY_CARE_PROVIDER_SITE_OTHER): Payer: 59 | Admitting: Orthopedic Surgery

## 2020-02-15 ENCOUNTER — Other Ambulatory Visit: Payer: Self-pay

## 2020-02-15 VITALS — Ht 70.0 in | Wt 140.0 lb

## 2020-02-15 DIAGNOSIS — M12572 Traumatic arthropathy, left ankle and foot: Secondary | ICD-10-CM | POA: Diagnosis not present

## 2020-02-15 MED ORDER — OXYCODONE-ACETAMINOPHEN 5-325 MG PO TABS: 1.0000 | ORAL_TABLET | Freq: Three times a day (TID) | ORAL | 0 refills | Status: DC | PRN

## 2020-02-15 NOTE — Progress Notes (Signed)
Office Visit Note   Patient: Gerald Fernandez           Date of Birth: 12-01-1965           MRN: [id number] Visit Date: 02/15/2020              Requested by: No referring provider defined for this encounter. PCP: Patient, No Pcp Per  Chief Complaint  Patient presents with  . Left Ankle - Follow-up      HPI: Patient is a 54 year old gentleman who is status post open left ankle fusion about a year ago.  Patient states he still having pain with prolonged activities on his foot pain radiates from the sinus Tarsi up the posterior aspect of his calf.  Patient has had about a day worth of relief from a subtalar injection.  Patient has seen Dr. 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  Assessment & Plan: Visit Diagnoses:  1. Traumatic arthritis of left foot     Plan: With prolonged symptoms from the subtalar joint I feel the best option is to proceed with a posterior arthroscopic subtalar arthrodesis.  From the posterior approach we could fuse the subtalar joint and could also fuse the area of fibrous union on the posterior aspect of the tibiotalar joint.  Would plan for outpatient surgery risks and benefits were discussed.  Patient states she understands wished to proceed at this time a refill prescription for Percocet is called in at this time.  Follow-Up Instructions: Return in about 2 weeks (around 02/29/2020).   Ortho Exam  Patient is alert, oriented, no adenopathy, well-dressed, normal affect, normal respiratory effort. Examination patient's foot is plantigrade he is still painful over the subtalar joint has minimal subtalar motion.  His CT scan was again reviewed with him showing the subtalar arthritis and showing most of the tibiotalar joint has healed well with some areas of fibrous union.  Imaging: No results found. No  images are attached to the encounter.  Labs: No results found for: HGBA1C, ESRSEDRATE, CRP, LABURIC, REPTSTATUS, GRAMSTAIN, CULT, LABORGA   Lab Results  Component Value Date   ALBUMIN 4.4 09/03/2018   ALBUMIN 3.1 (L) 04/06/2013   ALBUMIN 3.3 (L) 04/05/2013    No results found for: MG No results found for: VD25OH  No results found for: PREALBUMIN CBC EXTENDED Latest Ref Rng & Units 02/24/2019 09/03/2018 04/06/2013  WBC 4.0 - 10.5 K/uL 9.9 9.2 16.0(H)  RBC 4.22 - 5.81 MIL/uL 4.78 5.27 4.68  HGB 13.0 - 17.0 g/dL 44.0 34.7 42.5  HCT 39 - 52 % 45.6 47.7 41.6  PLT 150 - 400 K/uL 198 252 178  NEUTROABS 1 - 7 x10E3/uL - 6.0 -  LYMPHSABS 0 - 3 x10E3/uL - 2.1 -     Body mass index is 20.09 kg/m.  Orders:  No orders of the defined types were placed in this encounter.  No orders of the defined types were placed in this encounter.    Procedures: No procedures performed  Clinical Data: No additional findings.  ROS:  All other systems negative, except as noted in the HPI. Review of Systems  Objective: Vital Signs: Ht 5\' 10"  (1.778 m)   Wt 140 lb (63.5 kg)   BMI 20.09 kg/m   Specialty Comments:  No specialty comments available.  PMFS History:  Patient Active Problem List   Diagnosis Date Noted  . S/P ankle fusion 02/24/2019  . Post-traumatic osteoarthritis of left ankle   . 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 Impingement syndrome of left ankle 03/27/2017  . History of pulmonary embolus (PE) 04/04/2013  . Current smoker 04/04/2013   Past Medical History:  Diagnosis Date  . Allergic rhinitis   . Allergy   . Arthritis   . Complication of anesthesia   . Depression   . DVT (deep venous thrombosis) ([place]) 2010  . Hx of pulmonary embolus   . Impingement syndrome of left ankle   . PONV (postoperative nausea and vomiting)   . Smoker 12/02/07  . Tobacco use disorder   . 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 COPD Mother   . Hypertension Father     Past Surgical History:  Procedure Laterality Date  . ANKLE ARTHROSCOPY Left   . ANKLE FUSION Left 02/24/2019   Procedure: LEFT OPEN ANKLE FUSION;  Surgeon: Newt Minion, MD;  Location: [facility];  Service: Orthopedics;  Laterality: Left;  . APPENDECTOMY    . HAND SURGERY Left    fracture   Social History   Occupational History  . Not on file  Tobacco Use  . Smoking status: Current Every Day Smoker    Packs/day: 1.00    Years: 21.00    Pack years: 21.00    Types: Cigarettes  . Smokeless tobacco: Former Network engineer  . Vaping Use: Never used  Substance and Sexual Activity  . Alcohol use: Yes    Alcohol/week: 2.0 standard drinks    Types: 2 Standard drinks or equivalent per week  . Drug use: No  . Sexual activity: Yes

## 2020-02-25 ENCOUNTER — Other Ambulatory Visit: Payer: Self-pay | Admitting: Physician Assistant

## 2020-02-25 ENCOUNTER — Telehealth: Payer: Self-pay | Admitting: Orthopedic Surgery

## 2020-02-25 MED ORDER — OXYCODONE-ACETAMINOPHEN 5-325 MG PO TABS: 1.0000 | ORAL_TABLET | Freq: Three times a day (TID) | ORAL | 0 refills | Status: DC | PRN

## 2020-02-25 NOTE — Telephone Encounter (Signed)
I called patient to advise. Voicemail is not set up.

## 2020-02-25 NOTE — Telephone Encounter (Signed)
Please advise 

## 2020-02-25 NOTE — Telephone Encounter (Signed)
Refill done.  

## 2020-02-25 NOTE — Telephone Encounter (Signed)
Patient called.   Needs a refill on his oxycodone.   Call back: [phone]

## 2020-02-28 ENCOUNTER — Telehealth: Payer: Self-pay | Admitting: Orthopedic Surgery

## 2020-02-28 NOTE — Telephone Encounter (Signed)
I called Gerald Fernandez to discuss scheduling the subtalar fusion he discussed with Dr. Lajoyce Corners.  There was no answer and there was no voicemail set up to leave a message.

## 2020-03-08 ENCOUNTER — Telehealth: Payer: Self-pay | Admitting: Orthopedic Surgery

## 2020-03-08 NOTE — Telephone Encounter (Signed)
I called Gerald Fernandez regarding the surgery that was discussed at his last appointment.  Message left for patient to please return my call to discuss possible dates.

## 2020-06-22 ENCOUNTER — Encounter: Payer: Self-pay | Admitting: Psychiatry

## 2021-04-27 ENCOUNTER — Other Ambulatory Visit: Payer: Self-pay | Admitting: Orthopaedic Surgery

## 2021-05-29 ENCOUNTER — Encounter (HOSPITAL_COMMUNITY): Payer: Self-pay | Admitting: Physician Assistant

## 2021-05-29 ENCOUNTER — Other Ambulatory Visit: Payer: Self-pay

## 2021-05-29 ENCOUNTER — Encounter (HOSPITAL_COMMUNITY): Payer: Self-pay | Admitting: Orthopaedic Surgery

## 2021-05-29 NOTE — Progress Notes (Addendum)
Spoke with pt for pre-op call. Pt denies hx of HTN, cardiac hx or Diabetes. Pt has had a PE in 2015.   Pt uses Belbuca film for pain. I suggested that he talk with Dr. Susa Simmonds to see if he needs to hold it prior to surgery. He voiced understanding.  Pt states Dr. Susa Simmonds specifically said he wanted pt to stay overnight after surgery. Surgery was scheduled as ambulatory. I have called and left message with Dois Davenport, scheduler for Dr. Susa Simmonds to have her change that status. I have scheduled Gerald Fernandez for a Covid test tomorrow, 05/30/21 at 10:15 AM here at [facility].

## 2021-05-30 ENCOUNTER — Other Ambulatory Visit (HOSPITAL_COMMUNITY): Admission: RE | Admit: 2021-05-30 | Payer: 59 | Source: Ambulatory Visit

## 2021-05-30 NOTE — Anesthesia Preprocedure Evaluation (Deleted)
Anesthesia Evaluation    Airway        Dental   Pulmonary Current Smoker,           Cardiovascular      Neuro/Psych    GI/Hepatic   Endo/Other    Renal/GU      Musculoskeletal   Abdominal   Peds  Hematology   Anesthesia Other Findings   Reproductive/Obstetrics                             Anesthesia Physical Anesthesia Plan  ASA:   Anesthesia Plan:    Post-op Pain Management:    Induction:   PONV Risk Score and Plan:   Airway Management Planned:   Additional Equipment:   Intra-op Plan:   Post-operative Plan:   Informed Consent:   Plan Discussed with:   Anesthesia Plan Comments: (PAT note written 05/30/2021 by Shonna Chock, PA-C. )        Anesthesia Quick Evaluation

## 2021-05-30 NOTE — Progress Notes (Signed)
Anesthesia Chart Review: SAME DAY WORK-UP  Case: [id number] Date/Time: 06/07/21 1345   Procedure: LEFT BELOW THE KNEE AMPUTATION (Left: Knee)   Anesthesia type: General   Pre-op diagnosis: CHRONIC LEFT ANKLE PAIN   [facility] ROOM 09 / [facility]   Surgeons: Terance Hart, MD       DISCUSSION: Patient is a 55 year old male scheduled for the above procedure.  History includes smoking, postoperative N/V, DVT (2010), PE (04/04/2013, after trip to [place]), GERD, left ankle fusion (02/24/19), impingement syndrome left ankle.  Anesthesia team to evaluate on the day of surgery. He is on Belbuca. PAT phone RN notes indicate he would follow-up with Dr. Susa Simmonds on whether he felt it was okay to hold for surgery.   He is scheduled for preoperative COVID-19 test on 05/30/2021.   PROVIDERS: Patient, No Pcp Per (Inactive)   LABS:For day of surgery.    EKG: Last EKG noted was from August 2014.    CV: Echo 10/22/13: Study Conclusions  - Left ventricle: The cavity size was normal. Systolic    function was normal. The estimated ejection fraction was    in the range of 50% to 55%. Wall motion was normal; there    were no regional wall motion abnormalities.  - Mitral valve: Mild regurgitation.  - Right atrium: The atrium was mildly dilated.    Past Medical History:  Diagnosis Date   Allergic rhinitis    Allergy    Arthritis    Complication of anesthesia    Depression    situational (mother passing)   DVT (deep venous thrombosis) ([facility]) 2010   GERD (gastroesophageal reflux disease)    not at this time   Hx of pulmonary embolus 2014   Impingement syndrome of left ankle    PONV (postoperative nausea and vomiting)    Smoker 12/02/2007   Tobacco use disorder     Past Surgical History:  Procedure Laterality Date   ANKLE ARTHROSCOPY Left    ANKLE FUSION Left 02/24/2019   Procedure: LEFT OPEN ANKLE FUSION;  Surgeon: Nadara Mustard, MD;  Location: [facility] OR;  Service: Orthopedics;  Laterality:  Left;   APPENDECTOMY     HAND SURGERY Left    fracture    MEDICATIONS: No current facility-administered medications for this encounter.    BELBUCA 450 MCG FILM   celecoxib (CELEBREX) 100 MG capsule   ibuprofen (ADVIL,MOTRIN) 200 MG tablet   Multiple Vitamin (MULTIVITAMIN WITH MINERALS) TABS tablet   oxyCODONE-acetaminophen (PERCOCET) 10-325 MG tablet   pregabalin (LYRICA) 100 MG capsule   buPROPion (WELLBUTRIN SR) 150 MG 12 hr tablet   diclofenac sodium (VOLTAREN) 1 % GEL    Shonna Chock, PA-C Surgical Short Stay/Anesthesiology [facility] Phone [phone] [facility] Phone [phone] 05/30/2021 2:27 PM

## 2021-06-05 ENCOUNTER — Other Ambulatory Visit (HOSPITAL_COMMUNITY): Payer: 59

## 2021-06-06 ENCOUNTER — Other Ambulatory Visit (HOSPITAL_COMMUNITY)
Admission: RE | Admit: 2021-06-06 | Discharge: 2021-06-06 | Disposition: A | Discharge status: Home / Self Care | Payer: 59 | Source: Ambulatory Visit | Attending: Orthopaedic Surgery | Admitting: Orthopaedic Surgery

## 2021-06-06 DIAGNOSIS — U071 COVID-19: Secondary | ICD-10-CM | POA: Diagnosis not present

## 2021-06-06 DIAGNOSIS — Z20822 Contact with and (suspected) exposure to covid-19: Secondary | ICD-10-CM | POA: Diagnosis present

## 2021-06-06 LAB — SARS CORONAVIRUS 2 BY RT PCR (HOSPITAL ORDER, PERFORMED IN ~~LOC~~ HOSPITAL LAB): SARS Coronavirus 2: POSITIVE — AB

## 2021-06-06 NOTE — Progress Notes (Signed)
Attempted to go over pre-op instructions for his surgery scheduled for 06/07/21, however when he was asked about having any recent chest pain, shortness of breath (sob), or any pain at this time, he expressed having sob and a positive (+) home covid test on yesterday 06/06/21. He states he is scheduled for a rapid covid test today at [facility] at 1pm to confirm, and if the test is positive, he knows the surgery will have to be rescheduled.

## 2021-06-06 NOTE — Progress Notes (Signed)
Received call from [facility] regarding covid test. Patient has tested positive. Call paced to [place] at [facility]. Informed him of positive status and need to reschedule if surgery is deemed elective. Apolinar Junes to notify surgery scheduler and Dr Susa Simmonds. Call was also placed to patient regarding result, patient understands the office will be reaching out to him re new date.

## 2021-06-09 ENCOUNTER — Other Ambulatory Visit: Payer: Self-pay

## 2021-06-09 ENCOUNTER — Encounter (HOSPITAL_BASED_OUTPATIENT_CLINIC_OR_DEPARTMENT_OTHER): Payer: Self-pay | Admitting: Emergency Medicine

## 2021-06-09 ENCOUNTER — Emergency Department (HOSPITAL_BASED_OUTPATIENT_CLINIC_OR_DEPARTMENT_OTHER): Payer: 59

## 2021-06-09 ENCOUNTER — Emergency Department (HOSPITAL_BASED_OUTPATIENT_CLINIC_OR_DEPARTMENT_OTHER)
Admission: EM | Admit: 2021-06-09 | Discharge: 2021-06-09 | Disposition: A | Discharge status: Home / Self Care | Payer: 59 | Attending: Emergency Medicine | Admitting: Emergency Medicine

## 2021-06-09 DIAGNOSIS — R0602 Shortness of breath: Secondary | ICD-10-CM | POA: Diagnosis present

## 2021-06-09 DIAGNOSIS — R918 Other nonspecific abnormal finding of lung field: Secondary | ICD-10-CM

## 2021-06-09 DIAGNOSIS — U071 COVID-19: Secondary | ICD-10-CM | POA: Diagnosis not present

## 2021-06-09 DIAGNOSIS — R911 Solitary pulmonary nodule: Secondary | ICD-10-CM | POA: Insufficient documentation

## 2021-06-09 DIAGNOSIS — F17210 Nicotine dependence, cigarettes, uncomplicated: Secondary | ICD-10-CM | POA: Diagnosis not present

## 2021-06-09 DIAGNOSIS — J984 Other disorders of lung: Secondary | ICD-10-CM

## 2021-06-09 LAB — COMPREHENSIVE METABOLIC PANEL
ALT: 14 U/L (ref 0–44)
AST: 18 U/L (ref 15–41)
Albumin: 3.8 g/dL (ref 3.5–5.0)
Alkaline Phosphatase: 34 U/L — ABNORMAL LOW (ref 38–126)
Anion gap: 5 (ref 5–15)
BUN: 11 mg/dL (ref 6–20)
CO2: 30 mmol/L (ref 22–32)
Calcium: 9 mg/dL (ref 8.9–10.3)
Chloride: 105 mmol/L (ref 98–111)
Creatinine, Ser: 0.53 mg/dL — ABNORMAL LOW (ref 0.61–1.24)
GFR, Estimated: >60 mL/min (ref >60)
Glucose, Bld: 99 mg/dL (ref 70–99)
Potassium: 3.8 mmol/L (ref 3.5–5.1)
Sodium: 140 mmol/L (ref 135–145)
Total Bilirubin: 0.4 mg/dL (ref 0.3–1.2)
Total Protein: 6.7 g/dL (ref 6.5–8.1)

## 2021-06-09 LAB — CBC WITH DIFFERENTIAL/PLATELET
Abs Immature Granulocytes: 0.03 10*3/uL (ref 0.00–0.07)
Basophils Absolute: 0 10*3/uL (ref 0.0–0.1)
Basophils Relative: 0 %
Eosinophils Absolute: 0.1 10*3/uL (ref 0.0–0.5)
Eosinophils Relative: 1 %
HCT: 41.6 % (ref 39.0–52.0)
Hemoglobin: 14.2 g/dL (ref 13.0–17.0)
Immature Granulocytes: 0 %
Lymphocytes Relative: 25 %
Lymphs Abs: 2.3 10*3/uL (ref 0.7–4.0)
MCH: 30.4 pg (ref 26.0–34.0)
MCHC: 34.1 g/dL (ref 30.0–36.0)
MCV: 89.1 fL (ref 80.0–100.0)
Monocytes Absolute: 0.9 10*3/uL (ref 0.1–1.0)
Monocytes Relative: 10 %
Neutro Abs: 5.8 10*3/uL (ref 1.7–7.7)
Neutrophils Relative %: 64 %
Platelets: 177 10*3/uL (ref 150–400)
RBC: 4.67 MIL/uL (ref 4.22–5.81)
RDW: 12 % (ref 11.5–15.5)
WBC: 9.2 10*3/uL (ref 4.0–10.5)
nRBC: 0 % (ref 0.0–0.2)

## 2021-06-09 MED ORDER — SODIUM CHLORIDE 0.9 % IV BOLUS
1000.0000 mL | Freq: Once | INTRAVENOUS | Status: AC
  Administered 2021-06-09: 1000 mL via INTRAVENOUS

## 2021-06-09 MED ORDER — MOXIFLOXACIN HCL 400 MG PO TABS: 400.0000 mg | ORAL_TABLET | Freq: Every day | ORAL | 0 refills | Status: AC

## 2021-06-09 MED ORDER — IOHEXOL 350 MG/ML SOLN
100.0000 mL | Freq: Once | INTRAVENOUS | Status: AC | PRN
  Administered 2021-06-09: 100 mL via INTRAVENOUS

## 2021-06-09 MED ORDER — ONDANSETRON HCL 4 MG/2ML IJ SOLN
4.0000 mg | Freq: Once | INTRAMUSCULAR | Status: AC
  Administered 2021-06-09: 4 mg via INTRAVENOUS
  Filled 2021-06-09: qty 2

## 2021-06-09 NOTE — ED Notes (Signed)
Upon d/c patient began to feel short of breath. Reassured patient that he is at 100% oxygen and respirations are 18. Placed patient on 2L until discharge. Pt's status improved after reassurance.

## 2021-06-09 NOTE — Discharge Instructions (Addendum)
At this time we will treat you for possible lung infection with antibiotic for 3 weeks.  Please return if symptoms get worse.

## 2021-06-09 NOTE — Progress Notes (Addendum)
Pulmonary Telephone Evaluation  Contacted by ED physician, Dr. Lockie Mola. Reports no risk factors for TB (no IVDU, no international travel, no homelessness). Did travel to [place] one week ago. Works as a Psychologist, occupational  Patient active smoker presented with +COVID test three days ago and presented with shortness of breath. No cough, no sputum production. No chest pain. Hx of post-op PE/DVT. Vitals reviewed and normal oxygen.  Reviewed CT imaging in EMR.  Cavitary lung lesions, suspect infectious etiology however cannot rule out malignancy. Would recommend course of antibiotics with repeat imaging. If lesions persistent will discuss bronchoscopy.  --Recommend Moxifloxacin 400 mg daily x 21 days --Will arrange for follow-up with me (Dr. Mechele Collin) in three weeks after completion of antibiotics. Will coordinate CT chest without contrast prior to appointment.  Care Time: 20 min  Mechele Collin, M.D. [facility] Pulmonary/Critical Care Medicine   See Amion for personal pager For hours between 7 PM to 7 AM, please call Elink for urgent questions

## 2021-06-09 NOTE — ED Triage Notes (Signed)
Pt reports shortness of breath  today , Covid + x 3 days . Lethrgic , cough .

## 2021-06-09 NOTE — ED Notes (Signed)
RT Note: Pt. seen in COVID (+) waiting area, sitting in chair with room air Pulse Oximetry of 99%, Pulse of 64, b/l b.s. clear/diminished with RR of 16, has productive cough for tan/brown sputum with (+) smoking hx., Charge RN made aware.

## 2021-06-09 NOTE — ED Provider Notes (Signed)
[facility] EMERGENCY DEPT Provider Note   CSN: [id number] Arrival date & time: 06/09/21  1451     History Chief Complaint  Patient presents with   Shortness of Breath    Gerald Fernandez is a 55 y.o. male.  The history is provided by the patient.  Shortness of Breath Severity:  Mild Onset quality:  Gradual Duration:  3 days Timing:  Intermittent Progression:  Waxing and waning Chronicity:  New Context: URI (covid positive, symptoms for one week)   Relieved by:  Nothing Worsened by:  Nothing Associated symptoms: no abdominal pain, no chest pain, no cough, no ear pain, no fever, no rash, no sore throat and no vomiting   Risk factors: hx of PE/DVT       Past Medical History:  Diagnosis Date   Allergic rhinitis    Allergy    Arthritis    Complication of anesthesia    Depression    situational (mother passing)   DVT (deep venous thrombosis) (HCC) 2010   GERD (gastroesophageal reflux disease)    not at this time   Hx of pulmonary embolus 2014   Impingement syndrome of left ankle    PONV (postoperative nausea and vomiting)    Smoker 12/02/2007   Tobacco use disorder     Patient Active Problem List   Diagnosis Date Noted   S/P ankle fusion 02/24/2019   Post-traumatic osteoarthritis of left ankle    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   Impingement syndrome of left ankle 03/27/2017   History of pulmonary embolus (PE) 04/04/2013   Current smoker 04/04/2013    Past Surgical History:  Procedure Laterality Date   ANKLE ARTHROSCOPY Left    ANKLE FUSION Left 02/24/2019   Procedure: LEFT OPEN ANKLE FUSION;  Surgeon: Nadara Mustard, MD;  Location: [facility] OR;  Service: Orthopedics;  Laterality: Left;   APPENDECTOMY     HAND SURGERY Left    fracture       Family History  Problem Relation Age of Onset   COPD Mother    Hypertension Father     Social History   Tobacco Use   Smoking status: Every Day    Packs/day: 0.50    Years: 21.00     Pack years: 10.50    Types: Cigarettes   Smokeless tobacco: Former  Building services engineer Use: Former  Substance Use Topics   Alcohol use: Yes    Alcohol/week: 2.0 standard drinks    Types: 2 Standard drinks or equivalent per week    Comment: occasional/social   Drug use: No    Home Medications Prior to Admission medications   Medication Sig Start Date End Date Taking? Authorizing Provider  BELBUCA 450 MCG FILM Take 450 mcg by mouth 2 (two) times daily. 05/18/21  Yes [provider]  buPROPion (WELLBUTRIN SR) 150 MG 12 hr tablet Take 1 tablet (150 mg total) by mouth daily. 03/09/19  Yes Ronnald Nian, MD  celecoxib (CELEBREX) 100 MG capsule Take 100 mg by mouth 2 (two) times daily. 12/13/20  Yes [provider]  moxifloxacin (AVELOX) 400 MG tablet Take 1 tablet (400 mg total) by mouth daily at 8 pm for 21 days. 06/09/21 06/30/21 Yes Hoorain Kozakiewicz, DO  oxyCODONE-acetaminophen (PERCOCET) 10-325 MG tablet Take 1 tablet by mouth 4 (four) times daily as needed. 05/17/21  Yes [provider]  pregabalin (LYRICA) 100 MG capsule Take 100 mg by mouth at bedtime. 05/17/21  Yes [provider]  diclofenac sodium (VOLTAREN) 1 % GEL Apply 2 g topically 4 (four) times daily. Patient not taking: Reported on 05/28/2021 10/06/18   Rayburn, Fanny Bien, PA-C  ibuprofen (ADVIL,MOTRIN) 200 MG tablet Take 800 mg by mouth 2 (two) times daily.    [provider]  Multiple Vitamin (MULTIVITAMIN WITH MINERALS) TABS tablet Take 1 tablet by mouth daily. Centrum Gummies    [provider]    Allergies    Penicillins  Review of Systems   Review of Systems  Constitutional:  Negative for chills and fever.  HENT:  Negative for ear pain and sore throat.   Eyes:  Negative for pain and visual disturbance.  Respiratory:  Positive for shortness of breath. Negative for cough.   Cardiovascular:  Negative for chest pain and palpitations.  Gastrointestinal:  Negative  for abdominal pain and vomiting.  Genitourinary:  Negative for dysuria and hematuria.  Musculoskeletal:  Negative for arthralgias and back pain.  Skin:  Negative for color change and rash.  Neurological:  Negative for seizures and syncope.  All other systems reviewed and are negative.  Physical Exam Updated Vital Signs BP 130/84   Pulse 66   Temp 97.7 F (36.5 C)   Resp 16   Ht 5\' 10"  (1.778 m)   Wt 57.6 kg   SpO2 99%   BMI 18.22 kg/m   Physical Exam Vitals and nursing note reviewed.  Constitutional:      General: He is not in acute distress.    Appearance: He is well-developed. He is not ill-appearing.  HENT:     Head: Normocephalic and atraumatic.  Eyes:     Extraocular Movements: Extraocular movements intact.     Conjunctiva/sclera: Conjunctivae normal.     Pupils: Pupils are equal, round, and reactive to light.  Cardiovascular:     Rate and Rhythm: Normal rate and regular rhythm.     Pulses: Normal pulses.     Heart sounds: Normal heart sounds. No murmur heard. Pulmonary:     Effort: Pulmonary effort is normal. No respiratory distress.     Breath sounds: Normal breath sounds. No decreased breath sounds or wheezing.  Abdominal:     Palpations: Abdomen is soft.     Tenderness: There is no abdominal tenderness.  Musculoskeletal:     Cervical back: Normal range of motion and neck supple.  Skin:    General: Skin is warm and dry.     Capillary Refill: Capillary refill takes less than 2 seconds.  Neurological:     General: No focal deficit present.     Mental Status: He is alert.    ED Results / Procedures / Treatments   Labs (all labs ordered are listed, but only abnormal results are displayed) Labs Reviewed  COMPREHENSIVE METABOLIC PANEL - Abnormal; Notable for the following components:      Result Value   Creatinine, Ser 0.53 (*)    Alkaline Phosphatase 34 (*)    All other components within normal limits  CBC WITH DIFFERENTIAL/PLATELET    EKG EKG  Interpretation  Date/Time:  Saturday June 09 2021 15:40:31 EDT Ventricular Rate:  51 PR Interval:  175 QRS Duration: 79 QT Interval:  452 QTC Calculation: 417 R Axis:   81 Text Interpretation: Sinus rhythm Confirmed by [id number] ([id number]) on 06/09/2021 5:13:12 PM  Radiology CT Angio Chest PE W and/or Wo Contrast  Result Date: 06/09/2021 CLINICAL DATA:  covid positive, cxr with concern for mass EXAM: CT ANGIOGRAPHY CHEST WITH CONTRAST TECHNIQUE: Multidetector  CT imaging of the chest was performed using the standard protocol during bolus administration of intravenous contrast. Multiplanar CT image reconstructions and MIPs were obtained to evaluate the vascular anatomy. CONTRAST:  OMNIPAQUE IOHEXOL 350 MG/ML SOLN COMPARISON:  Chest x-ray 06/09/2020 FINDINGS: Cardiovascular: Satisfactory opacification of the pulmonary arteries to the segmental level. No evidence of pulmonary embolism. Normal heart size. No significant pericardial effusion. The thoracic aorta is normal in caliber. No atherosclerotic plaque of the thoracic aorta. At least 2 vessel coronary artery calcifications. Mediastinum/Nodes: Prominent right hilar lymph node. No enlarged mediastinal, hilar, or axillary lymph nodes. Thyroid gland, trachea, and esophagus demonstrate no significant findings. Lungs/Pleura: Biapical cavitary lesions with largest on the right measuring 4.7 x 3 cm. Largest on the left measures up to 2.5 x 1.8 cm. Associated pulmonary nodules within bilateral lung apices: 0.9 cm in the left upper lobe (7:51) and 0.9 cm at the right apex (7:31). Upper Abdomen: No acute abnormality. Musculoskeletal: No chest wall abnormality. No suspicious lytic or blastic osseous lesions. No acute displaced fracture. Multilevel mild degenerative changes of the spine. Review of the MIP images confirms the above findings. IMPRESSION: 1. Biapical pulmonary nodules and cavitary lesions (largest on the right measuring 4.7 x 3 cm).  Differential diagnosis for etiology includes granulomatous infection, noninfectious granulomatous disease, malignancy. Additional imaging evaluation or consultation with Pulmonology or Thoracic Surgery recommended. 2. No pulmonary embolus. Electronically Signed   By: Tish Frederickson M.D.   On: 06/09/2021 17:44   DG Chest Portable 1 View  Result Date: 06/09/2021 CLINICAL DATA:  Shortness of breath. EXAM: PORTABLE CHEST 1 VIEW COMPARISON:  Chest x-ray 06/18/2013, CT chest 04/04/2013 FINDINGS: The heart and mediastinal contours are within normal limits. Interval development of right apical opacity measuring at least 5.1 cm with query of underlying cavitation. Interval development of a left upper lobe approximate 1.9 cm density. No focal consolidation. No pulmonary edema. No pleural effusion. No pneumothorax. No acute osseous abnormality. IMPRESSION: Interval development of right apical opacity measuring 5-6 cm with query of underlying cavitation. Interval development of a left upper lobe approximate 1.9 cm density. Findings could represent infection versus malignancy. Recommend CT with intravenous contrast for further evaluation. Electronically Signed   By: Tish Frederickson M.D.   On: 06/09/2021 15:41    Procedures Procedures   Medications Ordered in ED Medications  iohexol (OMNIPAQUE) 350 MG/ML injection 100 mL (100 mLs Intravenous Contrast Given 06/09/21 1711)  sodium chloride 0.9 % bolus 1,000 mL (1,000 mLs Intravenous New Bag/Given 06/09/21 1720)  ondansetron (ZOFRAN) injection 4 mg (4 mg Intravenous Given 06/09/21 1722)    ED Course  I have reviewed the triage vital signs and the nursing notes.  Pertinent labs & imaging results that were available during my care of the patient were reviewed by me and considered in my medical decision making (see chart for details).    MDM Rules/Calculators/A&P                           Gerald Fernandez is here with shortness of breath.  Tested positive for  COVID recently.  Patient is a smoker.  History of PE and DVT in the past.  Has been feeling some shortness of breath.  But no cough or sputum production.  Patient with chest x-ray that shows some pulmonary nodules.  We will get a CT scan to further evaluate.  No significant anemia, electrolyte abnormality, kidney injury otherwise.  No fever, normal  vitals.  CT scan showed biapical pulmonary nodules and cavitary lesions.  Wide differential for this finding.  He has no risk factors for tuberculosis.  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  Has not had any sputum production or night sweats fevers and chills the last several weeks to months.  He is a smoker.  Talked with Dr. Everardo All with pulmonology who at this time will recommend we do a 3-week course antibiotics to treat for possible infection.  She will arrange for follow-up for further work-up.  Unable to test for TB at this time as we do not have testing capabilities here.  Does not have any high risk features for this.  Patient discharged in good condition.  Made aware of findings and understands need for follow-up with pulmonology.  Discharged in good condition.  Given return precautions.  This chart was dictated using voice recognition software.  Despite best efforts to proofread,  errors can occur which can change the documentation meaning.   Final Clinical Impression(s) / ED Diagnoses Final diagnoses:  Pulmonary nodules    Rx / DC Orders ED Discharge Orders          Ordered    moxifloxacin (AVELOX) 400 MG tablet  Daily        06/09/21 1933             Virgina Norfolk, DO 06/09/21 1935

## 2021-06-09 NOTE — ED Notes (Signed)
Pt verbalizes understanding of discharge instructions. Opportunity for questioning and answers were provided. Armand removed by staff, pt discharged from ED to home. Educated to pick up Rx and follow up with pulmonologist.

## 2021-06-09 NOTE — ED Notes (Signed)
Patient transported to CT 

## 2021-06-11 ENCOUNTER — Telehealth: Payer: Self-pay | Admitting: Pulmonary Disease

## 2021-06-11 DIAGNOSIS — R918 Other nonspecific abnormal finding of lung field: Secondary | ICD-10-CM

## 2021-06-11 NOTE — Telephone Encounter (Signed)
Called and spoke with Alison Stalling and she is aware of JE recs.  She is fine to wait for the appt after the CT scan.  She stated that he will not be done with abx until November but it looks like the CT scan is scheduled for 06/19/2021.  JE before I reschedule the CT scan, I wanted to check to make sure this is correct.  Thanks

## 2021-06-11 NOTE — Telephone Encounter (Signed)
JE please advise. Thanks   Pt is scheduled to have amputation on 10/20 and they are wanting to see if he can be seen prior to this date.  You have some help spots on 10/14.  DO you want to schedule him on this date to be seen?  There was a CT scan that you wanted prior to his next OV--do we need to get this scheduled as well?  Thanks

## 2021-06-11 NOTE — Telephone Encounter (Signed)
He needs to have completed his antibiotics before he gets the repeat CT scan. Happy to see him sooner to establish care but I will not have any recommendations until after he finishes antibiotics and gets CT.

## 2021-06-11 NOTE — Telephone Encounter (Signed)
Spoke with the pt's spouse, [name]  I advised her of response per JE  CT moved to 07/02/21 at 9:30 am  Nothing further needed

## 2021-06-11 NOTE — Telephone Encounter (Signed)
I called to get the patient schedule for a follow up the week of 07/02/21 through 07/06/2021 per Dr. Everardo All and left a message to establish with Dr. Everardo All.   He will also need a scan that is ordered per provider request before that follow up.

## 2021-06-11 NOTE — Telephone Encounter (Signed)
CT scan needs to be re-scheduled after antibiotic completion. He should have received a total of 21 days of antibiotics from the ED (06/09/21-06/30/21). If he has taken medication differently, we need to schedule after his last dose.

## 2021-06-11 NOTE — Telephone Encounter (Signed)
-----   Message from Chi Mechele Collin, MD sent at 06/09/2021  6:51 PM EDT ----- Regarding: New consult Please schedule patient as new consult with me the week of October 31-Nov 4th. Please place order for CT chest without contrast to be completed prior to visit

## 2021-06-12 NOTE — Telephone Encounter (Signed)
LM  Patient has Left BKA surgery planned for 10/20 he is wanting to know if he can still have his surgery since he had to move his CT.  JE please advise. Returns 06/15/21 to clinic.

## 2021-06-15 NOTE — Telephone Encounter (Signed)
I have called the pt and he is aware of JE recs.  He will call the surgical team to let them know what is going on and to have the surgery postponed.  He is having the CT scan on 10/31 and he has follow up on 11/1 with JE to discuss this.  Nothing further is needed.

## 2021-06-15 NOTE — Telephone Encounter (Addendum)
Patient will need to let the surgery team know that he is being treated for a lung abscess and will not complete antibiotics until the end of this month (06/30/21). Would recommend postponing until we can determine if he is responding to treatment.  Also, please schedule patient for follow-up with me after CT.

## 2021-06-19 ENCOUNTER — Other Ambulatory Visit: Payer: 59

## 2021-06-21 ENCOUNTER — Ambulatory Visit (HOSPITAL_COMMUNITY): Admission: RE | Admit: 2021-06-21 | Payer: 59 | Source: Home / Self Care | Admitting: Orthopaedic Surgery

## 2021-06-21 HISTORY — DX: Gastro-esophageal reflux disease without esophagitis: K21.9

## 2021-06-21 SURGERY — AMPUTATION BELOW KNEE
Anesthesia: General | Site: Knee | Laterality: Left

## 2021-07-02 ENCOUNTER — Other Ambulatory Visit: Payer: Self-pay

## 2021-07-02 ENCOUNTER — Ambulatory Visit (INDEPENDENT_AMBULATORY_CARE_PROVIDER_SITE_OTHER)
Admission: RE | Admit: 2021-07-02 | Discharge: 2021-07-02 | Disposition: A | Discharge status: Home / Self Care | Payer: 59 | Source: Ambulatory Visit | Attending: Pulmonary Disease | Admitting: Pulmonary Disease

## 2021-07-02 DIAGNOSIS — R918 Other nonspecific abnormal finding of lung field: Secondary | ICD-10-CM

## 2021-07-03 ENCOUNTER — Telehealth: Payer: Self-pay | Admitting: Pulmonary Disease

## 2021-07-03 ENCOUNTER — Encounter: Payer: Self-pay | Admitting: Pulmonary Disease

## 2021-07-03 ENCOUNTER — Ambulatory Visit (INDEPENDENT_AMBULATORY_CARE_PROVIDER_SITE_OTHER): Payer: 59 | Admitting: Pulmonary Disease

## 2021-07-03 VITALS — BP 100/80 | HR 82 | Temp 98.2°F | Ht 70.0 in | Wt 134.0 lb

## 2021-07-03 DIAGNOSIS — J984 Other disorders of lung: Secondary | ICD-10-CM

## 2021-07-03 MED ORDER — MOXIFLOXACIN HCL 400 MG PO TABS: 400.0000 mg | ORAL_TABLET | Freq: Every day | ORAL | 0 refills | Status: DC
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
## 2021-07-03 NOTE — Telephone Encounter (Signed)
Call report from [facility]    IMPRESSION: 1. No significant interval change in biapical pulmonary nodules and cavitary lesions. The largest is in the right upper lobe measuring 4.9 cm. Differential diagnosis includes granulomatous infection, non granulomatous disease and malignancy.     Electronically Signed   By: Darliss Cheney M.D.   On: 07/02/2021 16:59
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
## 2021-07-03 NOTE — Patient Instructions (Addendum)
Cavitary lung lesions - suspect infection however cannot rule out cancer --START Moxifloxacin for additional 4 weeks --REFER to Infectious Disease --ORDER echocardiogram --ARRANGE for bronchoscopy: EBUS vs navigational   Follow-up with me in 1 month to discuss bronchoscopy results

## 2021-07-04 ENCOUNTER — Encounter: Payer: Self-pay | Admitting: Pulmonary Disease

## 2021-07-05 ENCOUNTER — Other Ambulatory Visit: Payer: Self-pay

## 2021-07-05 ENCOUNTER — Ambulatory Visit (INDEPENDENT_AMBULATORY_CARE_PROVIDER_SITE_OTHER): Payer: 59 | Admitting: Internal Medicine

## 2021-07-05 ENCOUNTER — Encounter: Payer: Self-pay | Admitting: Internal Medicine

## 2021-07-05 VITALS — BP 117/76 | HR 96 | Temp 98.8°F | Resp 16 | Ht 70.0 in | Wt 135.8 lb

## 2021-07-05 DIAGNOSIS — J984 Other disorders of lung: Secondary | ICD-10-CM | POA: Diagnosis not present

## 2021-07-05 DIAGNOSIS — F17200 Nicotine dependence, unspecified, uncomplicated: Secondary | ICD-10-CM | POA: Diagnosis not present

## 2021-07-05 LAB — QUANTIFERON-TB GOLD PLUS
Mitogen-NIL: >10 IU/mL
NIL: 0.02 IU/mL
QuantiFERON-TB Gold Plus: NEGATIVE
TB1-NIL: 0.01 IU/mL
TB2-NIL: 0.01 IU/mL

## 2021-07-05 NOTE — Assessment & Plan Note (Signed)
Broad differential including infectious vs malignancy.  Agree with bronchoscopy for cultures as well as tissue for pathology.  Will check extensive labs today.  At time of bronchoscopy, would get bacterial, fungal, AFB cultures, PCP by DFA, Aspergillus galactomannan, tissue for pathology, cytology.  RTC 4 weeks.  For now continue with moxi as planned.

## 2021-07-05 NOTE — Progress Notes (Signed)
[facility] for Infectious Disease  Reason for Consult:Cavitary lung lesion  Referring Provider: Dr Loanne Drilling   HPI:    Gerald Fernandez is a 55 y.o. male with PMHx as below who presents to the clinic for cavitary lung lesion.   He has a history of active smoking, COVID 19 in late September 2022, provoked PE/DVT in 2014.  He was recently in the ED for SOB after recent home COVID positive test in late September.  He did have fevers at the time of his COVID positivity but this resolved prior to his ER visit.  He also reported loss of taste and smell which lingers to this day as well as tightness in his chest.  He underwent CT chest in the ED that noted cavitary lung lesions.  Pulmonary was consulted and recommended 3 weeks of moxifloxacin which he completed.  He saw Dr Loanne Drilling early this week and was referred to ID after a repeat CT scan showed no significant interval change after initial course of antibiotics.  Quantiferon is negative and Echo is pending.  He reports that in the several weeks to months leading up to his COVID test he was in his normal state of health and suspected no problems.  He reports that he subjectively feels better after several weeks of antibiotics and if it were not for the repeat imaging would have gone about his normal activities.  He does report weight loss that has occurred since about 2018 at which point he was approximately 180-190 pounds.  Around this time his mother died with whom he was very close and he had significant depression and lack of appetite.  He lost weight as a result and was down as low as 126 pounds.  He is 135 pounds today.   He also states that he has possible night sweats once per month for the past 6 months.  He describes going to bed with sweats on and will wake up to urinate and notice he has sweated through his clothes so he will take off some layers and get back in bed.  He has not thought much about this otherwise.  Other ROS, he  denies much in terms of cough or sputum production.  No hemoptysis.  He thinks he has a hiatal hernia but denies significant GERD or aspiration symptoms.  He is on chronic pain control for his ankle injury but denies any history of overdose that could lead to chronic aspiration.  Social Hx --  He is an active smoker x ~30 yrs (1/2 PPD), no IVDU, no substance use.  No hx of TB or prior exposure.  Works for [employer].  He has no hx of EtOH use disorder, no homelessness, no incarceration.  He is married.  He is from [place] but traveled around [place] throughout his life because his father was in the [employer].  He lived in [place] for several months 20+ years ago.  Has otherwise not lived in the other [place] or [place] area.  Has not traveled abroad.   No hx of autoimmune disorder.   Patient's Medications  New Prescriptions   No medications on file  Previous Medications   BELBUCA 450 MCG FILM    Take 450 mcg by mouth 2 (two) times daily.   BUPROPION (WELLBUTRIN SR) 150 MG 12 HR TABLET    Take 1 tablet (150 mg total) by mouth daily.   CELECOXIB (CELEBREX) 100 MG CAPSULE    Take  100 mg by mouth 2 (two) times daily.   DICLOFENAC SODIUM (VOLTAREN) 1 % GEL    Apply 2 g topically 4 (four) times daily.   IBUPROFEN (ADVIL,MOTRIN) 200 MG TABLET    Take 800 mg by mouth 2 (two) times daily.   MOXIFLOXACIN (AVELOX) 400 MG TABLET    Take 1 tablet (400 mg total) by mouth daily.   MULTIPLE VITAMIN (MULTIVITAMIN WITH MINERALS) TABS TABLET    Take 1 tablet by mouth daily. Centrum Gummies   OXYCODONE-ACETAMINOPHEN (PERCOCET) 10-325 MG TABLET    Take 1 tablet by mouth 4 (four) times daily as needed.   PREGABALIN (LYRICA) 100 MG CAPSULE    Take 100 mg by mouth at bedtime.  Modified Medications   No medications on file  Discontinued Medications   No medications on file      Past Medical History:  Diagnosis Date   Allergic rhinitis    Allergy    Arthritis    Complication of anesthesia     Depression    situational (mother passing)   DVT (deep venous thrombosis) ([place]) 2010   GERD (gastroesophageal reflux disease)    not at this time   Hx of pulmonary embolus 2014   Impingement syndrome of left ankle    PONV (postoperative nausea and vomiting)    Smoker 12/02/2007   Tobacco use disorder     Social History   Tobacco Use   Smoking status: Every Day    Packs/day: 0.50    Years: 21.00    Pack years: 10.50    Types: Cigarettes   Smokeless tobacco: Former  Scientific laboratory technician Use: Former  Substance Use Topics   Alcohol use: Yes    Alcohol/week: 2.0 standard drinks    Types: 2 Standard drinks or equivalent per week    Comment: occasional/social   Drug use: No    Family History  Problem Relation Age of Onset   COPD Mother    Hypertension Father     Allergies  Allergen Reactions   Penicillins Other (See Comments)    From childhood Did it involve swelling of the face/tongue/throat, SOB, or low BP? Yes Did it involve sudden or severe rash/hives, skin peeling, or any reaction on the inside of your mouth or nose? Unknown Did you need to seek medical attention at a hospital or doctor's office? Unknown When did it last happen? childhood reaction. If all above answers are "NO", may proceed with cephalosporin use.     Review of Systems  All other systems reviewed and are negative.  Except as noted in HPI.  OBJECTIVE:    Vitals:   07/05/21 1506  BP: 117/76  Pulse: 96  Resp: 16  Temp: 98.8 F (37.1 C)  TempSrc: Temporal  SpO2: (!) 77%  Weight: 135 lb 12.8 oz (61.6 kg)  Height: 5\' 10"  (1.778 m)     Body mass index is 19.49 kg/m.  Physical Exam Constitutional:      General: He is not in acute distress.    Appearance: He is not ill-appearing.     Comments: Thin appearing man in no distress.   HENT:     Head: Normocephalic and atraumatic.     Mouth/Throat:     Comments: Dentition is fair.  Eyes:     Extraocular Movements: Extraocular movements  intact.     Conjunctiva/sclera: Conjunctivae normal.  Pulmonary:     Effort: Pulmonary effort is normal. No respiratory distress.  Abdominal:  General: There is no distension.     Palpations: Abdomen is soft.     Tenderness: There is no abdominal tenderness.  Musculoskeletal:        General: Normal range of motion.     Cervical back: Normal range of motion and neck supple.  Skin:    General: Skin is warm and dry.     Coloration: Skin is not jaundiced.     Findings: No rash.  Neurological:     General: No focal deficit present.     Mental Status: He is alert and oriented to person, place, and time.  Psychiatric:        Mood and Affect: Mood normal.        Behavior: Behavior normal.     Labs and Microbiology:  CBC Latest Ref Rng & Units 06/09/2021 02/24/2019 09/03/2018  WBC 4.0 - 10.5 K/uL 9.2 9.9 9.2  Hemoglobin 13.0 - 17.0 g/dL 43.1 54.0 08.6  Hematocrit 39.0 - 52.0 % 41.6 45.6 47.7  Platelets 150 - 400 K/uL 177 198 252   CMP Latest Ref Rng & Units 06/09/2021 02/24/2019 09/03/2018  Glucose 70 - 99 mg/dL 99 91 85  BUN 6 - 20 mg/dL 11 15 8   Creatinine 0.61 - 1.24 mg/dL ) 7.61(P 5.09)  Sodium 135 - 145 mmol/L 140 140 141  Potassium 3.5 - 5.1 mmol/L 3.8 3.6 4.4  Chloride 98 - 111 mmol/L 105 105 102  CO2 22 - 32 mmol/L 30 26 25   Calcium 8.9 - 10.3 mg/dL 9.0 3.26(Z) 9.8  Total Protein 6.5 - 8.1 g/dL 6.7 - 6.8  Total Bilirubin 0.3 - 1.2 mg/dL 0.4 - 0.4  Alkaline Phos 38 - 126 U/L 34(L) - 38(L)  AST 15 - 41 U/L 18 - 21  ALT 0 - 44 U/L 14 - 14       ASSESSMENT & PLAN:    Cavitary lesion of lung Broad differential including infectious vs malignancy.  Agree with bronchoscopy for cultures as well as tissue for pathology.  Will check extensive labs today.  At time of bronchoscopy, would get bacterial, fungal, AFB cultures, PCP by DFA, Aspergillus galactomannan, tissue for pathology, cytology.  RTC 4 weeks.  For now continue with moxi as planned.    Orders Placed This  Encounter  Procedures   Blood culture (routine single)   Blood culture (routine single)   Hepatitis C antibody   CBC   HIV Antibody (routine testing w rflx)   COMPLETE METABOLIC PANEL WITH GFR   Coccidioides Antibodies   Histoplasma Antigen, Urine   Blastomyces Ab, ID   Bartonella anitbody panel   ANCA Screen Reflex Titer   ANA Screen,IFA,Reflex Titer/Pattern,Reflex Mplx 11 Ab Cascade with IdentRA   Aspergillus Antigen, Serum   Cryptococcal Ag, Ltx Scr Rflx Titer   Fungitell (1-3)-B-D-Glucan    for Infectious Disease [facility] 07/05/2021, 5:00 PM   I spent 60 minutes dedicated to the care of this patient on the date of this encounter to include pre-visit review of records, face-to-face time with the patient discussing cavitary lung lesion, and post-visit ordering of testing.

## 2021-07-05 NOTE — Patient Instructions (Signed)
Thank you for coming to see me today. It was a pleasure seeing you.  To Do: Labs today Plan for bronchoscopy with pulmonary Follow up in 6 weeks Continue antibiotics per Dr Everardo All.  If you have any questions or concerns, please do not hesitate to call the office at [phone].  Take Care,   Gwynn Burly

## 2021-07-10 ENCOUNTER — Telehealth: Payer: Self-pay | Admitting: Pulmonary Disease

## 2021-07-10 DIAGNOSIS — J984 Other disorders of lung: Secondary | ICD-10-CM

## 2021-07-10 NOTE — Telephone Encounter (Signed)
-----   Message from Chi Mechele Collin, MD sent at 07/04/2021 10:24 AM EDT ----- Regarding: Navigational bronchosocpy Good morning Gerald Fernandez,  This is the patient we discussed with the cavitary lesions that would be appropriate for navigational bronchoscopy. Thank you for your help. His quantiferon is pending.  Let me know what I can do to help facilitate his care. Erskine Squibb

## 2021-07-10 NOTE — Telephone Encounter (Signed)
PCCM:  Contacted for by Dr. Everardo All for review of images and consideration for bronchoscopy.  Patient has agreed to have bronchoscopy set up and we will work to schedule this in the coming weeks.  Tentative date will be 11/22 or 11/29  Thanks  BLI   Josephine Igo, DO [facility] 07/10/2021 5:32 PM

## 2021-07-10 NOTE — Telephone Encounter (Signed)
I am aware and coordinating date and time with Dr. Tonia Brooms.

## 2021-07-10 NOTE — Telephone Encounter (Signed)
I called the patient back and he wants to know the status of the Bronch procedure. Please advise.   ARRANGE for bronchoscopy: EBUS vs navigational  per Dr. Everardo All on 07/03/21.

## 2021-07-10 NOTE — Telephone Encounter (Signed)
Dr. Everardo All patient is calling about getting bronch scheduled but I don't see that there was an order put in with the dot phrase and nothing was sent to the procedure pool so I was not aware of this. If you need an EBUS done please place an on order for AMB to pulmonary and put the dot phrase and the PCC's will get it scheduled for you   .[email or web address]

## 2021-07-11 ENCOUNTER — Encounter: Payer: Self-pay | Admitting: Pulmonary Disease

## 2021-07-11 LAB — CULTURE, BLOOD (SINGLE)
MICRO NUMBER:: 12602257
Result:: NO GROWTH
SPECIMEN QUALITY:: ADEQUATE

## 2021-07-12 ENCOUNTER — Encounter: Payer: Self-pay | Admitting: *Deleted

## 2021-07-12 LAB — HISTOPLASMA ANTIGEN, URINE: Histoplasma Antigen, urine: <0.2 ng/mL

## 2021-07-13 LAB — COMPLETE METABOLIC PANEL WITH GFR
AG Ratio: 1.5 (calc) (ref 1.0–2.5)
ALT: 13 U/L (ref 9–46)
AST: 19 U/L (ref 10–35)
Albumin: 4.6 g/dL (ref 3.6–5.1)
Alkaline phosphatase (APISO): 50 U/L (ref 35–144)
BUN: 13 mg/dL (ref 7–25)
CO2: 30 mmol/L (ref 20–32)
Calcium: 9.6 mg/dL (ref 8.6–10.3)
Chloride: 102 mmol/L (ref 98–110)
Creat: 0.75 mg/dL (ref 0.70–1.30)
Globulin: 3.1 g/dL (calc) (ref 1.9–3.7)
Glucose, Bld: 76 mg/dL (ref 65–99)
Potassium: 4.2 mmol/L (ref 3.5–5.3)
Sodium: 141 mmol/L (ref 135–146)
Total Bilirubin: 0.4 mg/dL (ref 0.2–1.2)
Total Protein: 7.7 g/dL (ref 6.1–8.1)
eGFR: 108 mL/min/{1.73_m2} (ref >=60)

## 2021-07-13 LAB — CBC
HCT: 46.3 % (ref 38.5–50.0)
Hemoglobin: 15.6 g/dL (ref 13.2–17.1)
MCH: 31.4 pg (ref 27.0–33.0)
MCHC: 33.7 g/dL (ref 32.0–36.0)
MCV: 93.2 fL (ref 80.0–100.0)
MPV: 10.3 fL (ref 7.5–12.5)
Platelets: 237 10*3/uL (ref 140–400)
RBC: 4.97 10*6/uL (ref 4.20–5.80)
RDW: 11.8 % (ref 11.0–15.0)
WBC: 7.8 10*3/uL (ref 3.8–10.8)

## 2021-07-13 LAB — BARTONELLA ANTIBODY PANEL
B. henselae IgG Screen: NEGATIVE
B. henselae IgM Screen: NEGATIVE

## 2021-07-13 LAB — CULTURE, BLOOD (SINGLE)
MICRO NUMBER:: 12595508
Result:: NO GROWTH
SPECIMEN QUALITY:: ADEQUATE

## 2021-07-13 LAB — BLASTOMYCES AB, ID: Blastomyces Abs, Qn, DID: NEGATIVE

## 2021-07-13 LAB — HEPATITIS C ANTIBODY
Hepatitis C Ab: NONREACTIVE
SIGNAL TO CUT-OFF: 0.11 (ref <1.00)

## 2021-07-13 LAB — ASPERGILLUS ANTIGEN,SERUM
Aspergillus Ag, EIA: NOT DETECTED
Index Value: 0.08 (ref <0.50)

## 2021-07-13 LAB — ANCA SCREEN W REFLEX TITER: ANCA Screen: NEGATIVE

## 2021-07-13 LAB — CRYPTOCOCCAL AG, LTX SCR RFLX TITER
Cryptococcal Ag Screen: NOT DETECTED
MICRO NUMBER:: 12592327
SPECIMEN QUALITY:: ADEQUATE

## 2021-07-13 LAB — FUNGITELL (1-3)-B-D-GLUCAN
(1-3)-B-D-Glucan: <31 pg/mL (ref <60)
Interpretation: NEGATIVE

## 2021-07-13 LAB — COCCIDIOIDES ANTIBODIES: COCCIDIOIDES AB, CF, SERUM: <1:2 {titer}

## 2021-07-13 LAB — HIV ANTIBODY (ROUTINE TESTING W REFLEX): HIV 1&2 Ab, 4th Generation: NONREACTIVE

## 2021-07-13 LAB — ANA SCREEN,IFA,REFLEX TITER/PATTERN,REFLEX MPLX 11 AB CASCADE
14-3-3 eta Protein: <0.2 ng/mL (ref <0.2)
Anti Nuclear Antibody (ANA): NEGATIVE
Cyclic Citrullin Peptide Ab: <16 UNITS
Rheumatoid fact SerPl-aCnc: <14 IU/mL (ref <14)

## 2021-07-16 ENCOUNTER — Telehealth: Payer: Self-pay

## 2021-07-16 NOTE — Telephone Encounter (Addendum)
Attempted to contact patient to review labs. Phone never went to voicemail. Sent MyChart message to follow up.  Oretha Weismann Loyola Mast, RN

## 2021-07-16 NOTE — Telephone Encounter (Signed)
-----   Message from Kathlynn Grate, DO sent at 07/16/2021 11:33 AM EST ----- Can you please let patient know that lab work up for an infectious etiology of his lung imaging was unrevealing and did not provide any diagnostic clues.  Hopefully the bronchscopy and biopsy that is scheduled later this month will be informative.  Thanks

## 2021-07-24 NOTE — Telephone Encounter (Signed)
Pt had an OV with Dr. Everardo All 11/1 and this was discussed with pt during that visit. Closing encounter.

## 2021-07-25 ENCOUNTER — Ambulatory Visit (HOSPITAL_COMMUNITY): Payer: 59 | Attending: Pulmonary Disease

## 2021-07-25 ENCOUNTER — Other Ambulatory Visit: Payer: Self-pay

## 2021-07-25 DIAGNOSIS — R0602 Shortness of breath: Secondary | ICD-10-CM | POA: Diagnosis not present

## 2021-07-25 DIAGNOSIS — J984 Other disorders of lung: Secondary | ICD-10-CM | POA: Insufficient documentation

## 2021-07-25 LAB — ECHOCARDIOGRAM COMPLETE
Area-P 1/2: 3.26 cm2
S' Lateral: 2.9 cm

## 2021-07-27 ENCOUNTER — Encounter (HOSPITAL_COMMUNITY): Payer: Self-pay | Admitting: Pulmonary Disease

## 2021-07-27 NOTE — Progress Notes (Signed)
EKG: 06/12/21 CXR: 06/09/21 ECHO: 07/25/21 Stress Test: denies Cardiac Cath: denies  Fasting Blood Sugar- na Checks Blood Sugar__na_ times a day  OSA/CPAP: No  ASA/Blood Thinner: No  Covid test positive late September, 2022 as stated in note.  No need to retest.   Anesthesia Review: No  Patient denies shortness of breath, fever, cough, and chest pain at PAT appointment.  Patient verbalized understanding of instructions provided today at the PAT appointment.  Patient asked to review instructions at home and day of surgery.

## 2021-07-30 ENCOUNTER — Encounter (HOSPITAL_COMMUNITY): Payer: Self-pay | Admitting: Pulmonary Disease

## 2021-07-31 ENCOUNTER — Ambulatory Visit (HOSPITAL_COMMUNITY): Payer: 59 | Admitting: Certified Registered"

## 2021-07-31 ENCOUNTER — Ambulatory Visit (HOSPITAL_COMMUNITY): Payer: 59

## 2021-07-31 ENCOUNTER — Ambulatory Visit (HOSPITAL_COMMUNITY)
Admission: RE | Admit: 2021-07-31 | Discharge: 2021-07-31 | Disposition: A | Discharge status: Home / Self Care | Payer: 59 | Attending: Pulmonary Disease | Admitting: Pulmonary Disease

## 2021-07-31 ENCOUNTER — Other Ambulatory Visit: Payer: Self-pay

## 2021-07-31 ENCOUNTER — Encounter (HOSPITAL_COMMUNITY)
Admission: RE | Disposition: A | Discharge status: Home / Self Care | Payer: Self-pay | Source: Home / Self Care | Attending: Pulmonary Disease

## 2021-07-31 ENCOUNTER — Encounter (HOSPITAL_COMMUNITY): Payer: Self-pay | Admitting: Pulmonary Disease

## 2021-07-31 DIAGNOSIS — J984 Other disorders of lung: Secondary | ICD-10-CM | POA: Diagnosis not present

## 2021-07-31 DIAGNOSIS — R918 Other nonspecific abnormal finding of lung field: Secondary | ICD-10-CM | POA: Diagnosis present

## 2021-07-31 DIAGNOSIS — F419 Anxiety disorder, unspecified: Secondary | ICD-10-CM | POA: Insufficient documentation

## 2021-07-31 DIAGNOSIS — R634 Abnormal weight loss: Secondary | ICD-10-CM | POA: Insufficient documentation

## 2021-07-31 DIAGNOSIS — F32A Depression, unspecified: Secondary | ICD-10-CM | POA: Insufficient documentation

## 2021-07-31 DIAGNOSIS — Z86711 Personal history of pulmonary embolism: Secondary | ICD-10-CM | POA: Diagnosis not present

## 2021-07-31 DIAGNOSIS — Z419 Encounter for procedure for purposes other than remedying health state, unspecified: Secondary | ICD-10-CM

## 2021-07-31 DIAGNOSIS — Z8616 Personal history of COVID-19: Secondary | ICD-10-CM | POA: Insufficient documentation

## 2021-07-31 DIAGNOSIS — M1990 Unspecified osteoarthritis, unspecified site: Secondary | ICD-10-CM | POA: Diagnosis not present

## 2021-07-31 DIAGNOSIS — Z86718 Personal history of other venous thrombosis and embolism: Secondary | ICD-10-CM | POA: Diagnosis not present

## 2021-07-31 DIAGNOSIS — Z681 Body mass index (BMI) 19 or less, adult: Secondary | ICD-10-CM | POA: Diagnosis not present

## 2021-07-31 DIAGNOSIS — Z9889 Other specified postprocedural states: Secondary | ICD-10-CM

## 2021-07-31 DIAGNOSIS — F17210 Nicotine dependence, cigarettes, uncomplicated: Secondary | ICD-10-CM | POA: Diagnosis not present

## 2021-07-31 HISTORY — PX: BRONCHIAL WASHINGS: SHX5105

## 2021-07-31 HISTORY — PX: BRONCHIAL BIOPSY: SHX5109

## 2021-07-31 HISTORY — PX: BRONCHIAL BRUSHINGS: SHX5108

## 2021-07-31 SURGERY — BRONCHOSCOPY, WITH BIOPSY USING ELECTROMAGNETIC NAVIGATION
Anesthesia: General | Laterality: Bilateral

## 2021-07-31 MED ORDER — PROPOFOL 10 MG/ML IV BOLUS
INTRAVENOUS | Status: DC | PRN
  Administered 2021-07-31: 200 mg via INTRAVENOUS

## 2021-07-31 MED ORDER — LACTATED RINGERS IV SOLN: INTRAVENOUS | Status: DC

## 2021-07-31 MED ORDER — ONDANSETRON HCL 4 MG/2ML IJ SOLN: 4.0000 mg | Freq: Once | INTRAMUSCULAR | Status: DC | PRN

## 2021-07-31 MED ORDER — FENTANYL CITRATE (PF) 100 MCG/2ML IJ SOLN: 25.0000 ug | INTRAMUSCULAR | Status: DC | PRN

## 2021-07-31 MED ORDER — KETOROLAC TROMETHAMINE 30 MG/ML IJ SOLN
30.0000 mg | Freq: Once | INTRAMUSCULAR | Status: DC | PRN
  Filled 2021-07-31: qty 1

## 2021-07-31 MED ORDER — SUGAMMADEX SODIUM 200 MG/2ML IV SOLN
INTRAVENOUS | Status: DC | PRN
  Administered 2021-07-31: 200 mg via INTRAVENOUS

## 2021-07-31 MED ORDER — ACETAMINOPHEN 325 MG PO TABS
325.0000 mg | ORAL_TABLET | ORAL | Status: DC | PRN
  Filled 2021-07-31: qty 2

## 2021-07-31 MED ORDER — DEXAMETHASONE SODIUM PHOSPHATE 10 MG/ML IJ SOLN
INTRAMUSCULAR | Status: DC | PRN
  Administered 2021-07-31: 5 mg via INTRAVENOUS

## 2021-07-31 MED ORDER — ONDANSETRON HCL 4 MG/2ML IJ SOLN
INTRAMUSCULAR | Status: DC | PRN
  Administered 2021-07-31: 4 mg via INTRAVENOUS

## 2021-07-31 MED ORDER — CHLORHEXIDINE GLUCONATE 0.12 % MT SOLN
OROMUCOSAL | Status: AC
  Filled 2021-07-31: qty 15

## 2021-07-31 MED ORDER — ACETAMINOPHEN 160 MG/5ML PO SOLN
325.0000 mg | ORAL | Status: DC | PRN
  Filled 2021-07-31: qty 20.3

## 2021-07-31 MED ORDER — ROCURONIUM BROMIDE 10 MG/ML (PF) SYRINGE
PREFILLED_SYRINGE | INTRAVENOUS | Status: DC | PRN
  Administered 2021-07-31: 60 mg via INTRAVENOUS
  Administered 2021-07-31: 10 mg via INTRAVENOUS

## 2021-07-31 MED ORDER — MEPERIDINE HCL 100 MG/ML IJ SOLN: 6.2500 mg | INTRAMUSCULAR | Status: DC | PRN

## 2021-07-31 MED ORDER — CHLORHEXIDINE GLUCONATE 0.12 % MT SOLN
15.0000 mL | Freq: Once | OROMUCOSAL | Status: AC
  Administered 2021-07-31: 15 mL via OROMUCOSAL
  Filled 2021-07-31: qty 15

## 2021-07-31 MED ORDER — LIDOCAINE 2% (20 MG/ML) 5 ML SYRINGE
INTRAMUSCULAR | Status: DC | PRN
  Administered 2021-07-31: 60 mg via INTRAVENOUS

## 2021-07-31 NOTE — Discharge Instructions (Signed)
Flexible Bronchoscopy, Care After This sheet gives you information about how to care for yourself after your test. Your doctor may also give you more specific instructions. If you have problems or questions, contact your doctor. Follow these instructions at home: Eating and drinking Do not eat or drink anything (not even water) for 2 hours after your test, or until your numbing medicine (local anesthetic) wears off. When your numbness is gone and your cough and gag reflexes have come back, you may: Eat only soft foods. Slowly drink liquids. The day after the test, go back to your normal diet. Driving Do not drive for 24 hours if you were given a medicine to help you relax (sedative). Do not drive or use heavy machinery while taking prescription pain medicine. General instructions  Take over-the-counter and prescription medicines only as told by your doctor. Return to your normal activities as told. Ask what activities are safe for you. Do not use any products that have nicotine or tobacco in them. This includes cigarettes and e-cigarettes. If you need help quitting, ask your doctor. Keep all follow-up visits as told by your doctor. This is important. It is very important if you had a tissue sample (biopsy) taken. Get help right away if: You have shortness of breath that gets worse. You get light-headed. You feel like you are going to pass out (faint). You have chest pain. You cough up: More than a little blood. More blood than before. Summary Do not eat or drink anything (not even water) for 2 hours after your test, or until your numbing medicine wears off. Do not use cigarettes. Do not use e-cigarettes. Get help right away if you have chest pain.  This information is not intended to replace advice given to you by your health care provider. Make sure you discuss any questions you have with your health care provider. Document Released: 06/16/2009 Document Revised: 08/01/2017 Document  Reviewed: 09/06/2016 Elsevier Patient Education  2020 [employer].

## 2021-07-31 NOTE — Anesthesia Postprocedure Evaluation (Signed)
Anesthesia Post Note  Patient: Gerald Fernandez  Procedure(s) Performed: ROBOTIC ASSISTED NAVIGATIONAL BRONCHOSCOPY (Bilateral) BRONCHIAL BIOPSIES BRONCHIAL WASHINGS BRONCHIAL BRUSHINGS     Patient location during evaluation: PACU Anesthesia Type: General Level of consciousness: awake Pain management: pain level controlled Vital Signs Assessment: post-procedure vital signs reviewed and stable Respiratory status: spontaneous breathing Cardiovascular status: stable Postop Assessment: no apparent nausea or vomiting Anesthetic complications: no   No notable events documented.  Last Vitals:  Vitals:   07/31/21 1430 07/31/21 1445  BP: 111/71 104/67  Pulse: (!) 59 (!) 54  Resp: 14 14  Temp:  37.6 C  SpO2: 98% 98%    Last Pain:  Vitals:   07/31/21 1445  TempSrc:   PainSc: 0-No pain                 Caren Macadam

## 2021-07-31 NOTE — Anesthesia Procedure Notes (Signed)
Procedure Name: Intubation Date/Time: 07/31/2021 12:57 PM Performed by: Lynnell Chad, CRNA Pre-anesthesia Checklist: Patient identified, Emergency Drugs available, Suction available and Patient being monitored Patient Re-evaluated:Patient Re-evaluated prior to induction Oxygen Delivery Method: Circle System Utilized Preoxygenation: Pre-oxygenation with 100% oxygen Induction Type: IV induction Ventilation: Mask ventilation without difficulty Laryngoscope Size: Miller and 3 Grade View: Grade I Tube type: Oral Tube size: 8.5 mm Number of attempts: 1 Airway Equipment and Method: Stylet and Oral airway Placement Confirmation: ETT inserted through vocal cords under direct vision, positive ETCO2 and breath sounds checked- equal and bilateral Secured at: 22 cm Tube secured with: Tape Dental Injury: Teeth and Oropharynx as per pre-operative assessment

## 2021-07-31 NOTE — Anesthesia Preprocedure Evaluation (Signed)
Anesthesia Evaluation  Patient identified by MRN, date of birth, ID band Patient awake    Reviewed: Allergy & Precautions, NPO status , Patient's Chart, lab work & pertinent test results  History of Anesthesia Complications (+) PONV and history of anesthetic complications  Airway Mallampati: I  TM Distance: >3 FB Neck ROM: Full    Dental no notable dental hx. (+) Teeth Intact, Dental Advisory Given   Pulmonary Current Smoker and Patient abstained from smoking., PE   Pulmonary exam normal breath sounds clear to auscultation       Cardiovascular negative cardio ROS Normal cardiovascular exam Rhythm:Regular Rate:Normal  TTE 2015 EF 50-55%, mild MR   Neuro/Psych PSYCHIATRIC DISORDERS Anxiety Depression negative neurological ROS     GI/Hepatic Neg liver ROS,   Endo/Other  negative endocrine ROS  Renal/GU negative Renal ROS  negative genitourinary   Musculoskeletal  (+) Arthritis ,   Abdominal Normal abdominal exam  (+)   Peds  Hematology negative hematology ROS (+)   Anesthesia Other Findings   Reproductive/Obstetrics                             Anesthesia Physical  Anesthesia Plan  ASA: II  Anesthesia Plan: General   Post-op Pain Management:  Regional for Post-op pain   Induction: Intravenous  PONV Risk Score and Plan: 2 and Midazolam, Treatment may vary due to age or medical condition and Ondansetron  Airway Management Planned: Oral ETT  Additional Equipment: None  Intra-op Plan:   Post-operative Plan: Extubation in OR  Informed Consent: I have reviewed the patients History and Physical, chart, labs and discussed the procedure including the risks, benefits and alternatives for the proposed anesthesia with the patient or authorized representative who has indicated his/her understanding and acceptance.     Dental advisory given  Plan Discussed with: CRNA  Anesthesia Plan  Comments:         Anesthesia Quick Evaluation

## 2021-07-31 NOTE — Op Note (Signed)
Video Bronchoscopy with Robotic Assisted Bronchoscopic Navigation   Date of Operation: 07/31/2021   Pre-op Diagnosis: Bilateral upper lobe cavities  Post-op Diagnosis: Bilateral upper lobe cavities  Surgeon: Garner Nash, DO   Assistants: None   Anesthesia: General endotracheal anesthesia  Operation: Flexible video fiberoptic bronchoscopy with robotic assistance and biopsies.  Estimated Blood Loss: Minimal  Complications: None  Indications and History: Gerald Fernandez is a 55 y.o. male with history of bilateral upper lobe cavities. The risks, benefits, complications, treatment options and expected outcomes were discussed with the patient.  The possibilities of pneumothorax, pneumonia, reaction to medication, pulmonary aspiration, perforation of a viscus, bleeding, failure to diagnose a condition and creating a complication requiring transfusion or operation were discussed with the patient who freely signed the consent.    Description of Procedure: The patient was seen in the Preoperative Area, was examined and was deemed appropriate to proceed.  The patient was taken to [facility] endoscopy room 3, identified as Gerald Fernandez and the procedure verified as Flexible Video Fiberoptic Bronchoscopy.  A Time Out was held and the above information confirmed.   Prior to the date of the procedure a high-resolution CT scan of the chest was performed. Utilizing ION software program a virtual tracheobronchial tree was generated to allow the creation of distinct navigation pathways to the patient's parenchymal abnormalities. After being taken to the operating room general anesthesia was initiated and the patient  was orally intubated. The video fiberoptic bronchoscope was introduced via the endotracheal tube and a general inspection was performed which showed normal right and left lung anatomy, aspiration of the bilateral mainstems was completed to remove any remaining secretions. Robotic catheter inserted  into patient's endotracheal tube.   Target #1 right upper lobe cavity: The distinct navigation pathways prepared prior to this procedure were then utilized to navigate to patient's lesion identified on CT scan. The robotic catheter was secured into place and the vision probe was withdrawn.  Lesion location was approximated using fluoroscopy and radial endobronchial ultrasound for peripheral targeting. Under fluoroscopic guidance transbronchial needle brushings, and transbronchial forceps biopsies were performed to be sent for cytology and pathology. A bronchioalveolar lavage was performed in the right upper lobe and sent for cytology.  Target #2 left upper lobe: The distinct navigation pathways prepared prior to this procedure were then utilized to navigate to patient's lesion identified on CT scan. The robotic catheter was secured into place and the vision probe was withdrawn.  Lesion location was approximated using fluoroscopy and radial endobronchial ultrasound for peripheral targeting. Under fluoroscopic guidance transbronchial needle brushings and transbronchial forceps biopsies were performed to be sent for cytology and pathology. A bronchioalveolar lavage was performed in the left upper lobe and sent for cytology.  Additional tissue biopsies from both sides were collected for culture.  At the end of the procedure a general airway inspection was performed and there was no evidence of active bleeding. The bronchoscope was removed.  The patient tolerated the procedure well. There was no significant blood loss and there were no obvious complications. A post-procedural chest x-ray is pending.  Samples Target #1: 1. Transbronchial needle brushings from right upper lobe 3. Transbronchial forceps biopsies from right upper lobe 4. Bronchoalveolar lavage from right upper lobe  Samples Target #2: 1. Transbronchial needle brushings from left upper lobe 3. Transbronchial forceps biopsies from left upper  lobe 4. Bronchoalveolar lavage from left upper lobe  Plans:  The patient will be discharged from the PACU to home  when recovered from anesthesia and after chest x-ray is reviewed. We will review the cytology, pathology and microbiology results with the patient when they become available. Outpatient followup will be with Dr. Rodman Pickle.   Garner Nash, DO [facility] 07/31/2021 2:14 PM

## 2021-07-31 NOTE — Interval H&P Note (Signed)
History and Physical Interval Note:  07/31/2021 11:02 AM  Gerald Fernandez  has presented today for surgery, with the diagnosis of lung nodule.  The various methods of treatment have been discussed with the patient and family. After consideration of risks, benefits and other options for treatment, the patient has consented to  Procedure(s) with comments: ROBOTIC ASSISTED NAVIGATIONAL BRONCHOSCOPY (Bilateral) - w/ CIOS as a surgical intervention.  The patient's history has been reviewed, patient examined, no change in status, stable for surgery.  I have reviewed the patient's chart and labs.  Questions were answered to the patient's satisfaction.     Rachel Bo Marylee Belzer

## 2021-07-31 NOTE — Transfer of Care (Signed)
Immediate Anesthesia Transfer of Care Note  Patient: Gerald Fernandez  Procedure(s) Performed: ROBOTIC ASSISTED NAVIGATIONAL BRONCHOSCOPY (Bilateral) BRONCHIAL BIOPSIES BRONCHIAL NEEDLE ASPIRATION BIOPSIES BRONCHIAL WASHINGS BRONCHIAL BRUSHINGS  Patient Location: PACU  Anesthesia Type:General  Level of Consciousness: awake, alert  and patient cooperative  Airway & Oxygen Therapy: Patient Spontanous Breathing and Patient connected to nasal cannula oxygen  Post-op Assessment: Report given to RN and Post -op Vital signs reviewed and stable  Post vital signs: Reviewed and stable  Last Vitals:  Vitals Value Taken Time  BP 120/78 07/31/21 1358  Temp    Pulse 65 07/31/21 1401  Resp 8 07/31/21 1401  SpO2 97 % 07/31/21 1401  Vitals shown include unvalidated device data.  Last Pain:  Vitals:   07/31/21 1052  TempSrc:   PainSc: 3          Complications: No notable events documented.

## 2021-08-01 LAB — CYTOLOGY - NON PAP

## 2021-08-02 ENCOUNTER — Encounter (HOSPITAL_COMMUNITY): Payer: Self-pay | Admitting: Pulmonary Disease

## 2021-08-02 LAB — ACID FAST SMEAR (AFB, MYCOBACTERIA)
Acid Fast Smear: NEGATIVE
Acid Fast Smear: NEGATIVE
Acid Fast Smear: NEGATIVE
Acid Fast Smear: NEGATIVE

## 2021-08-03 LAB — CULTURE, BAL-QUANTITATIVE W GRAM STAIN
Culture: NO GROWTH
Culture: NO GROWTH
Gram Stain: NONE SEEN

## 2021-08-03 LAB — PNEUMOCYSTIS JIROVECI SMEAR BY DFA

## 2021-08-03 LAB — ASPERGILLUS ANTIGEN, BAL/SERUM: Aspergillus Ag, BAL/Serum: 0.09 Index (ref 0.00–0.49)

## 2021-08-04 LAB — MISC LABCORP TEST (SEND OUT): Labcorp test code: 183805

## 2021-08-05 LAB — AEROBIC/ANAEROBIC CULTURE W GRAM STAIN (SURGICAL/DEEP WOUND)
Culture: NO GROWTH
Culture: NO GROWTH
Gram Stain: NONE SEEN
Gram Stain: NONE SEEN

## 2021-08-05 LAB — ANAEROBIC CULTURE W GRAM STAIN: Gram Stain: NONE SEEN

## 2021-08-05 LAB — ASPERGILLUS ANTIGEN, BAL/SERUM: Aspergillus Ag, BAL/Serum: 0.07 Index (ref 0.00–0.49)

## 2021-08-06 ENCOUNTER — Other Ambulatory Visit: Payer: Self-pay

## 2021-08-06 ENCOUNTER — Ambulatory Visit (INDEPENDENT_AMBULATORY_CARE_PROVIDER_SITE_OTHER): Payer: 59 | Admitting: Pulmonary Disease

## 2021-08-06 ENCOUNTER — Encounter: Payer: Self-pay | Admitting: Pulmonary Disease

## 2021-08-06 VITALS — BP 120/72 | HR 71 | Temp 98.3°F | Ht 70.0 in | Wt 138.0 lb

## 2021-08-06 DIAGNOSIS — J984 Other disorders of lung: Secondary | ICD-10-CM | POA: Diagnosis not present
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
## 2021-08-06 NOTE — Patient Instructions (Addendum)
Cavitary lung lesions  --CT Chest without contrast --Keep follow-up with ID on 08/15/21  Follow-up with me in early Feb

## 2021-08-07 LAB — CYTOLOGY - NON PAP

## 2021-08-15 ENCOUNTER — Encounter: Payer: Self-pay | Admitting: Internal Medicine

## 2021-08-15 ENCOUNTER — Ambulatory Visit (INDEPENDENT_AMBULATORY_CARE_PROVIDER_SITE_OTHER): Payer: 59 | Admitting: Internal Medicine

## 2021-08-15 ENCOUNTER — Other Ambulatory Visit: Payer: Self-pay

## 2021-08-15 DIAGNOSIS — M19172 Post-traumatic osteoarthritis, left ankle and foot: Secondary | ICD-10-CM | POA: Diagnosis not present

## 2021-08-15 DIAGNOSIS — A310 Pulmonary mycobacterial infection: Secondary | ICD-10-CM | POA: Diagnosis not present

## 2021-08-15 NOTE — Progress Notes (Signed)
[facility]  CHIEF COMPLAINT:    Follow up for cavitary lung lesion.  SUBJECTIVE:    Gerald Fernandez is a 55 y.o. male with PMHx as below who presents to the clinic for cavitary lung lesion.   He is here today for routine follow up.  Status post bronchoscopy on 11/29 that was negative for malignancy.  He saw pulmonary on 12/5 and plan is for repeat CT scan next week.  He reports subjective improvement in fevers, night sweats.  He is no longer having these complaints.  He had post bronchoscopy cough and blood tinge sputum but this resolved.  He has occasional shortness of breath but otherwise feels pretty well.  He completed about 7 weeks total antibiotics and has been off treatment now for about 2 weeks without any worsening.   His AFB cultures from BAL turned positive at 2 weeks with MAC identified by DNA probe.  Please see A&P for the details of today's visit and status of the patient's medical problems.   Patient's Medications  New Prescriptions   No medications on file  Previous Medications   BELBUCA 450 MCG FILM    Take 450 mcg by mouth 2 (two) times daily.   IBUPROFEN (ADVIL) 800 MG TABLET    Take 800 mg by mouth in the morning and at bedtime.   MOXIFLOXACIN (AVELOX) 400 MG TABLET    Take 1 tablet (400 mg total) by mouth daily.   MULTIPLE VITAMIN (MULTIVITAMIN WITH MINERALS) TABS TABLET    Take 1 tablet by mouth in the morning. Centrum Gummies   OLOPATADINE (PATANOL) 0.1 % OPHTHALMIC SOLUTION    1 drop 2 (two) times daily as needed for allergies.   OXYCODONE-ACETAMINOPHEN (PERCOCET) 10-325 MG TABLET    Take 1 tablet by mouth 4 (four) times daily as needed for pain.   PREGABALIN (LYRICA) 100 MG CAPSULE    Take 100 mg by mouth at bedtime.  Modified Medications   No medications on file  Discontinued Medications   No medications on file      Past Medical History:  Diagnosis Date   Allergy    Arthritis    Depression    situational (mother  passing)   DVT (deep venous thrombosis) ([place]) 2010   GERD (gastroesophageal reflux disease)    not at this time   Hx of pulmonary embolus 2014   Impingement syndrome of left ankle    PONV (postoperative nausea and vomiting)    Smoker 12/02/2007   Tobacco use disorder     Social History   Tobacco Use   Smoking status: Every Day    Packs/day: 0.50    Years: 21.00    Pack years: 10.50    Types: Cigarettes   Smokeless tobacco: Former  Scientific laboratory technician Use: Former  Substance Use Topics   Alcohol use: Yes    Alcohol/week: 2.0 standard drinks    Types: 2 Standard drinks or equivalent per week    Comment: occasional/social   Drug use: No    Family History  Problem Relation Age of Onset   COPD Mother    Hypertension Father     Allergies  Allergen Reactions   Penicillins Other (See Comments)    From childhood Did it involve swelling of the face/tongue/throat, SOB, or low BP? Yes Did it involve sudden or severe rash/hives, skin peeling, or any reaction on the inside of your mouth or nose? Unknown Did  you need to seek medical attention at a hospital or doctor's office? Unknown When did it last happen? childhood reaction. If all above answers are "NO", may proceed with cephalosporin use.     Review of Systems  All other systems reviewed and are negative. Except as noted above in HPI.    OBJECTIVE:    Vitals:   08/15/21 1602  BP: 127/82  Pulse: 71  Temp: 98.2 F (36.8 C)  TempSrc: Temporal  SpO2: 97%  Weight: 139 lb 9.6 oz (63.3 kg)   Body mass index is 20.03 kg/m.  Physical Exam Constitutional:      General: He is not in acute distress.    Appearance: Normal appearance.  HENT:     Head: Normocephalic and atraumatic.  Eyes:     Extraocular Movements: Extraocular movements intact.     Conjunctiva/sclera: Conjunctivae normal.  Pulmonary:     Effort: Pulmonary effort is normal. No respiratory distress.  Skin:    General: Skin is warm and dry.      Findings: No rash.  Neurological:     General: No focal deficit present.     Mental Status: He is alert and oriented to person, place, and time.  Psychiatric:        Mood and Affect: Mood normal.        Behavior: Behavior normal.     Labs and Microbiology: CBC Latest Ref Rng & Units 07/05/2021 06/09/2021 02/24/2019  WBC 3.8 - 10.8 Thousand/uL 7.8 9.2 9.9  Hemoglobin 13.2 - 17.1 g/dL 15.6 14.2 15.3  Hematocrit 38.5 - 50.0 % 46.3 41.6 45.6  Platelets 140 - 400 Thousand/uL 237 177 198   CMP Latest Ref Rng & Units 07/05/2021 06/09/2021 02/24/2019  Glucose 65 - 99 mg/dL 76 99 91  BUN 7 - 25 mg/dL _0 Creatinine 0.70 - 1.30 mg/dL 0.75 0.53(L) 0.91  Sodium 135 - 146 mmol/L 141 140 140  Potassium 3.5 - 5.3 mmol/L 4.2 3.8 3.6  Chloride 98 - 110 mmol/L 102 105 105  CO2 20 - 32 mmol/L _1 Calcium 8.6 - 10.3 mg/dL 9.6 9.0 8.5(L)  Total Protein 6.1 - 8.1 g/dL 7.7 6.7 -  Total Bilirubin 0.2 - 1.2 mg/dL 0.4 0.4 -  Alkaline Phos 38 - 126 U/L - 34(L) -  AST 10 - 35 U/L 19 18 -  ALT 9 - 46 U/L 13 14 -      ASSESSMENT & PLAN:    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  Discussed with patient at length regarding treatment for pulmonary MAC with 12-18 months of therapy and 3-4 antibiotics that can be associated with toxicity and side effects.   He does have radiographic findings and meets microbiologic criteria to consider treatment.  His symptoms have improved and are minimal at this point, however, given the cavitary disease would recommend treatment at this time.  He is in agreement.  I am thinking Azithromycin 500 mg daily, Rifampin 600 mg daily, and Ethambutol 15 mg/kg daily but will discuss with pharmacy first.  Given his cavitary disease will also look into inhaled amikacin.  He will follow up in 6 weeks and continue to follow up with pulmonary as  well.    Post-traumatic osteoarthritis of left ankle He has been considering BKA for chronic ankle pain.  Have asked him to hold off on elective  surgery for the time being.     Raynelle Highland for Infectious Disease [facility] 08/15/2021, 4:51 PM   I spent 40 minutes dedicated to the care of this patient on the date of this encounter to include pre-visit review of records, face-to-face time with the patient discussing pulmonary MAC, and post-visit ordering of testing.

## 2021-08-15 NOTE — Assessment & Plan Note (Signed)
He has been considering BKA for chronic ankle pain.  Have asked him to hold off on elective surgery for the time being.

## 2021-08-15 NOTE — Assessment & Plan Note (Signed)
Extensive work up has been unrevealing for malignancy, however, his AFB BAL cultures are positive for MAC by DNA probe.  Discussed with patient at length regarding treatment for pulmonary MAC with 12-18 months of therapy and 3-4 antibiotics that can be associated with toxicity and side effects.   He does have radiographic findings and meets microbiologic criteria to consider treatment.  His symptoms have improved and are minimal at this point, however, given the cavitary disease would recommend treatment at this time.  He is in agreement.  I am thinking Azithromycin 500 mg daily, Rifampin 600 mg daily, and Ethambutol 15 mg/kg daily but will discuss with pharmacy first.  Given his cavitary disease will also look into inhaled amikacin.  He will follow up in 6 weeks and continue to follow up with pulmonary as well.

## 2021-08-15 NOTE — Patient Instructions (Signed)
Thank you for coming to see me today. It was a pleasure seeing you.  To Do: I will be in touch soon about an antibiotic plan Follow up in 6 weeks  If you have any questions or concerns, please do not hesitate to call the office at [phone].  Take Care,   Gwynn Burly

## 2021-08-16 ENCOUNTER — Other Ambulatory Visit: Payer: Self-pay | Admitting: Internal Medicine

## 2021-08-16 ENCOUNTER — Encounter: Payer: Self-pay | Admitting: Internal Medicine

## 2021-08-17 ENCOUNTER — Other Ambulatory Visit (HOSPITAL_COMMUNITY): Payer: Self-pay

## 2021-08-17 ENCOUNTER — Other Ambulatory Visit: Payer: Self-pay | Admitting: Pharmacist

## 2021-08-17 ENCOUNTER — Encounter: Payer: Self-pay | Admitting: Pharmacist

## 2021-08-17 DIAGNOSIS — A310 Pulmonary mycobacterial infection: Secondary | ICD-10-CM

## 2021-08-17 MED ORDER — ARIKAYCE 590 MG/8.4ML IN SUSP: 590.0000 mg | Freq: Every day | RESPIRATORY_TRACT | 11 refills | Status: DC

## 2021-08-17 MED ORDER — ETHAMBUTOL HCL 100 MG PO TABS: ORAL_TABLET | ORAL | 11 refills | Status: DC

## 2021-08-17 MED ORDER — ETHAMBUTOL HCL 400 MG PO TABS: ORAL_TABLET | ORAL | 11 refills | Status: DC

## 2021-08-17 MED ORDER — MOXIFLOXACIN HCL 400 MG PO TABS: 400.0000 mg | ORAL_TABLET | Freq: Every day | ORAL | 11 refills | Status: DC

## 2021-08-17 MED ORDER — AZITHROMYCIN 500 MG PO TABS: 500.0000 mg | ORAL_TABLET | Freq: Every day | ORAL | 11 refills | Status: DC

## 2021-08-20 ENCOUNTER — Other Ambulatory Visit: Payer: Self-pay | Admitting: Pharmacist

## 2021-08-20 ENCOUNTER — Telehealth: Payer: Self-pay

## 2021-08-20 ENCOUNTER — Other Ambulatory Visit (HOSPITAL_COMMUNITY): Payer: Self-pay

## 2021-08-20 DIAGNOSIS — A310 Pulmonary mycobacterial infection: Secondary | ICD-10-CM

## 2021-08-20 MED ORDER — ARIKAYCE 590 MG/8.4ML IN SUSP: 590.0000 mg | Freq: Every day | RESPIRATORY_TRACT | 11 refills | Status: DC

## 2021-08-20 NOTE — Telephone Encounter (Signed)
[facility] Patient Advocate Encounter  Prior Authorization for Gerald Fernandez has been approved.    PA# [id number] Effective dates: 07/21/21 through 08/20/22  Prescription will need to be filled at a Specialty Pharmacy ([facility]) .   I will call and see if patient will need a copay coupon card.   [facility] will continue to follow.  Gerald Fernandez, CPhT Specialty Pharmacy Patient [facility] Phone: [phone] Fax:  [phone]

## 2021-08-21 ENCOUNTER — Other Ambulatory Visit: Payer: Self-pay

## 2021-08-21 ENCOUNTER — Telehealth: Payer: Self-pay | Admitting: Pharmacist

## 2021-08-21 ENCOUNTER — Ambulatory Visit (INDEPENDENT_AMBULATORY_CARE_PROVIDER_SITE_OTHER)
Admission: RE | Admit: 2021-08-21 | Discharge: 2021-08-21 | Disposition: A | Discharge status: Home / Self Care | Payer: 59 | Source: Ambulatory Visit | Attending: Pulmonary Disease | Admitting: Pulmonary Disease

## 2021-08-21 DIAGNOSIS — J984 Other disorders of lung: Secondary | ICD-10-CM | POA: Diagnosis not present

## 2021-08-21 NOTE — Telephone Encounter (Signed)
Patient's PA for Arikayce has been approved and prescription has been sent to [facility] to coordinate copay assistance and shipment.  I faxed patient application to [employer] today to initiate program. They will send a nurse out to his home once he gets the medication to teach him how to administer and what to expect. Will continue to follow.   Jamita Mckelvin L. Maurice Ramseur, PharmD [facility] Clinical Pharmacist Practitioner

## 2021-08-22 ENCOUNTER — Telehealth: Payer: Self-pay | Admitting: Pulmonary Disease

## 2021-08-22 NOTE — Telephone Encounter (Signed)
[facility] Telephone Encounter  I updated patient on CT scan. Unchanged cavitary lesions in the upper lobe with new GGO in RLL and subcentimeter nodules  associated with GGO in LUL and RUL. Likely related to MAC. I have previously discussed with his ID physician who has recommended treatment. Will continue to coordinate care during his treatment course and obtain serial CT imaging as clinically indicated.  Rodman Pickle, M.D. [facility] 08/22/2021 9:41 AM

## 2021-08-24 LAB — MAC SUSCEPTIBILITY BROTH
Amikacin: 8
Ciprofloxacin: >8
Clarithromycin: 2
Doxycycline: >8
Linezolid: >32
Minocycline: >8
Moxifloxacin: 4
Rifabutin: 0.5
Rifampin: >4
Streptomycin: 32

## 2021-08-24 LAB — AFB ORGANISM ID BY DNA PROBE
M avium complex: POSITIVE — AB
M tuberculosis complex: NEGATIVE

## 2021-08-24 LAB — ACID FAST CULTURE WITH REFLEXED SENSITIVITIES (MYCOBACTERIA): Acid Fast Culture: POSITIVE — AB

## 2021-08-28 ENCOUNTER — Other Ambulatory Visit: Payer: Self-pay | Admitting: Pharmacist

## 2021-08-28 ENCOUNTER — Telehealth: Payer: Self-pay

## 2021-08-28 DIAGNOSIS — A310 Pulmonary mycobacterial infection: Secondary | ICD-10-CM

## 2021-08-28 MED ORDER — ETHAMBUTOL HCL 100 MG PO TABS
ORAL_TABLET | ORAL | 11 refills | Status: DC
Start: 2021-08-28 — End: 2022-09-18

## 2021-08-28 NOTE — Telephone Encounter (Signed)
[facility] Patient Advocate Encounter  I have spoken to [facility] , patient medication (Arikayce) will be delivered on 08/30/21.  Clearance Coots , CPhT Specialty Pharmacy Patient [facility] Phone: [phone] Fax:  [phone]

## 2021-08-29 NOTE — Progress Notes (Signed)
Hey! We really did.. it was great. Hope you did too!  I agree with you on the clofazimine. I think it is our next best option. Want me to proceed with an application to get it? Usually it is 7-10 days for approval/arrival but the holidays may make it a bit longer.

## 2021-08-30 LAB — FUNGUS CULTURE RESULT

## 2021-08-30 LAB — FUNGUS CULTURE WITH STAIN

## 2021-08-30 LAB — FUNGAL ORGANISM REFLEX

## 2021-09-05 ENCOUNTER — Encounter: Payer: Self-pay | Admitting: Pharmacist

## 2021-09-05 ENCOUNTER — Telehealth: Payer: Self-pay | Admitting: Pharmacist

## 2021-09-05 NOTE — Telephone Encounter (Signed)
Completed online application for clofazimine with [employer] . Will update Dr. Earlene Plater when approval status has been decided.   Treshun Wold L. Coila Wardell, PharmD [facility] Clinical Pharmacist Practitioner

## 2021-09-13 LAB — ACID FAST CULTURE WITH REFLEXED SENSITIVITIES (MYCOBACTERIA)
Acid Fast Culture: NEGATIVE
Acid Fast Culture: NEGATIVE
Acid Fast Culture: NEGATIVE

## 2021-09-20 NOTE — Progress Notes (Signed)
Still waiting on this BTW.. we had some hang ups because I had to resubmit the paperwork from the IRB. Should hear soon.

## 2021-09-24 ENCOUNTER — Ambulatory Visit (INDEPENDENT_AMBULATORY_CARE_PROVIDER_SITE_OTHER): Payer: 59 | Admitting: Pharmacist

## 2021-09-24 ENCOUNTER — Encounter: Payer: Self-pay | Admitting: Pharmacist

## 2021-09-24 ENCOUNTER — Other Ambulatory Visit: Payer: Self-pay

## 2021-09-24 DIAGNOSIS — A310 Pulmonary mycobacterial infection: Secondary | ICD-10-CM

## 2021-09-24 MED ORDER — AMBULATORY NON FORMULARY MEDICATION: 100.0000 mg | Freq: Every day | 2 refills | Status: DC

## 2021-09-24 NOTE — Progress Notes (Signed)
09/24/2021  HPI: Gerald Fernandez is a 56 y.o. male who presents to the [place] clinic today for clofazimine pickup and follow-up MAC treatment counseling.  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   AZITHROMYCIN (ZITHROMAX) 500 MG TABLET    Take 1 tablet (500 mg total) by mouth daily.   BELBUCA 450 MCG FILM    Take 450 mcg by mouth 2 (two) times daily.   ETHAMBUTOL (MYAMBUTOL) 100 MG TABLET    Take two 100 mg tablets WITH two 400 mg tablets of ethambutol together to equal 1000 mg total   ETHAMBUTOL (MYAMBUTOL) 400 MG TABLET    Take two 400 mg tablets WITH two 100 mg tablets of ethambutol together to equal 1000 mg total   IBUPROFEN (ADVIL) 800 MG TABLET    Take 800 mg by mouth in the morning and at bedtime.   MOXIFLOXACIN (AVELOX) 400 MG TABLET    Take 1 tablet (400 mg total) by mouth daily.   MULTIPLE VITAMIN (MULTIVITAMIN WITH MINERALS) TABS TABLET    Take 1 tablet by mouth in the morning. Centrum Gummies   OLOPATADINE (PATANOL) 0.1 % OPHTHALMIC SOLUTION    1 drop 2 (two) times daily as needed for allergies.   OXYCODONE-ACETAMINOPHEN (PERCOCET) 10-325 MG TABLET    Take 1 tablet by mouth 4 (four) times daily as needed for pain.   PREGABALIN (LYRICA) 100 MG CAPSULE    Take 100 mg by mouth at bedtime.  Modified Medications   No medications on file  Discontinued Medications   No medications on file    Allergies: Allergies  Allergen  Reactions   Penicillins Other (See Comments)    From childhood Did it involve swelling of the face/tongue/throat, SOB, or low BP? Yes Did it involve sudden or severe rash/hives, skin peeling, or any reaction on the inside of your mouth or nose? Unknown Did you need to seek medical attention at a hospital or doctor's office? Unknown When did it last happen? childhood reaction. If all above answers are "NO", may proceed with cephalosporin use.     Past Medical History: Past Medical History:  Diagnosis Date   Allergy    Arthritis    Depression    situational (mother passing)   DVT (deep venous thrombosis) ([place]) 2010   GERD (gastroesophageal reflux disease)    not at this time   Hx of pulmonary embolus 2014   Impingement syndrome of left ankle    PONV (postoperative nausea and vomiting)    Smoker 12/02/2007   Tobacco use disorder     Social History: Social History   Socioeconomic History   Marital status: Married    Spouse name: Not on file   Number of children: Not on file   Years of education: Not on file   Highest education level: Not on file  Occupational History   Not on file  Tobacco Use   Smoking status: Every Day  Packs/day: 0.50    Years: 21.00    Pack years: 10.50    Types: Cigarettes   Smokeless tobacco: Former  Scientific laboratory technician Use: Former  Substance and Sexual Activity   Alcohol use: Yes    Alcohol/week: 2.0 standard drinks    Types: 2 Standard drinks or equivalent per week    Comment: occasional/social   Drug use: No   Sexual activity: Yes  Other Topics Concern   Not on file  Social History Narrative   Series of family deaths (1) aunt very close to him of heart disease in 19-Oct-2012, (2) [place] father not close to family after 34 years of service died 10-19-14 of cancer, and (3) mother to whom he was most close dying in 2016/10/19 after 9 hours of cardiac surgery unexpected having survived breast cancer prior to that.   Social Determinants of Health    Financial Resource Strain: Not on file  Food Insecurity: Not on file  Transportation Needs: Not on file  Physical Activity: Not on file  Stress: Not on file  Social Connections: Not on file     Assessment: Tykee presents today to pick up clofazimine for treatment of MAC. His disease is resistant to moxifloxacin, requiring a switch to clofazimine. Tolerates regimen of azithromycin, ethambutol, and Arikayce without major issues. Patient counseled to take clofazimine with food and common adverse effects (skin/fluid discoloration, GI upset, dry skin). All questions were answered and patient verbalized understanding on the regimen. Patient instructed to reach out with any questions and requesting refills.   Plan: -- Start clofazimine 118m daily with food, dispensed to patient -- F/u with Dr. WJuleen China1/25/23 -- Call with any issues or questions  YMichela Pitcher COna PharmD Student  09/24/2021, 3:06 PM

## 2021-09-26 ENCOUNTER — Telehealth: Payer: Self-pay

## 2021-09-26 ENCOUNTER — Ambulatory Visit (INDEPENDENT_AMBULATORY_CARE_PROVIDER_SITE_OTHER): Payer: 59 | Admitting: Internal Medicine

## 2021-09-26 ENCOUNTER — Other Ambulatory Visit (HOSPITAL_COMMUNITY): Payer: Self-pay

## 2021-09-26 ENCOUNTER — Other Ambulatory Visit: Payer: Self-pay

## 2021-09-26 ENCOUNTER — Encounter: Payer: Self-pay | Admitting: Internal Medicine

## 2021-09-26 VITALS — BP 129/82 | HR 74 | Temp 98.6°F | Wt 143.4 lb

## 2021-09-26 DIAGNOSIS — A310 Pulmonary mycobacterial infection: Secondary | ICD-10-CM

## 2021-09-26 LAB — COMPREHENSIVE METABOLIC PANEL
AG Ratio: 1.6 (calc) (ref 1.0–2.5)
ALT: 11 U/L (ref 9–46)
AST: 15 U/L (ref 10–35)
Albumin: 4.2 g/dL (ref 3.6–5.1)
Alkaline phosphatase (APISO): 38 U/L (ref 35–144)
BUN: 15 mg/dL (ref 7–25)
CO2: 32 mmol/L (ref 20–32)
Calcium: 9.4 mg/dL (ref 8.6–10.3)
Chloride: 104 mmol/L (ref 98–110)
Creat: 0.77 mg/dL (ref 0.70–1.30)
Globulin: 2.6 g/dL (calc) (ref 1.9–3.7)
Glucose, Bld: 87 mg/dL (ref 65–99)
Potassium: 4.1 mmol/L (ref 3.5–5.3)
Sodium: 141 mmol/L (ref 135–146)
Total Bilirubin: 0.3 mg/dL (ref 0.2–1.2)
Total Protein: 6.8 g/dL (ref 6.1–8.1)

## 2021-09-26 LAB — CBC
HCT: 43.7 % (ref 38.5–50.0)
Hemoglobin: 14.6 g/dL (ref 13.2–17.1)
MCH: 30.9 pg (ref 27.0–33.0)
MCHC: 33.4 g/dL (ref 32.0–36.0)
MCV: 92.4 fL (ref 80.0–100.0)
MPV: 10.3 fL (ref 7.5–12.5)
Platelets: 228 10*3/uL (ref 140–400)
RBC: 4.73 10*6/uL (ref 4.20–5.80)
RDW: 11.5 % (ref 11.0–15.0)
WBC: 9.4 10*3/uL (ref 3.8–10.8)

## 2021-09-26 NOTE — Assessment & Plan Note (Signed)
Patient now on MAC therapy with azithromycin 515m daily, Ethambutol 10069mdaily, inhaled Amikacin 59031maily since December 2022.  After susceptibilities returned on his MAC isolate, Moxifloxacin was discontinued in place of Clofazimine 100m55mily which he started this week.  He is tolerating regimen well thus far.  Will check CBC and CMP today.  Referral to audiology with long term use of inhaled amikacin placed as well.  He was encouraged to reach out to his pulmonologist regarding any inhalers or other techniques to help with airway clearance.  Will follow up in about 6 weeks and plan for repeat sputum cultures at that time to assess for response to therapy.

## 2021-09-26 NOTE — Telephone Encounter (Signed)
[facility] Patient Advocate Encounter   Received notification from [facility] that prior authorization for Arikayce is required.   PA submitted on 09/26/21 [place] [id number] Status is pending  When approved Call [place] @[phone] will continue to follow.   Ileene Kweli, [facility] Patient [facility] Phone: [phone] Fax:  [phone]

## 2021-09-26 NOTE — Patient Instructions (Addendum)
Thank you for coming to see me today. It was a pleasure seeing you.  To Do: Continue antibiotics as prescribed Labs today and I will let you know if any issues Referral to audiology for baseline hearing testing Check with Dr Everardo All regarding any inhalers or "flutter" valve to help with airway clearance  If you have any questions or concerns, please do not hesitate to call the office at [phone].  Take Care,   Gwynn Burly

## 2021-09-26 NOTE — Progress Notes (Signed)
[facility] for Infectious Disease  CHIEF COMPLAINT:    Follow up for pulmonary MAC  SUBJECTIVE:    Gerald Fernandez is a 56 y.o. male with PMHx as below who presents to the clinic for pulmonary MAC.   He is here today for routine follow up.  He was last seen by me on 08/15/21 and more recently by pharmacy on 09/24/21.    Status post bronchoscopy on 11/29 that was negative for malignancy.  He saw pulmonary on 12/5 and underwent repeat CT scan 08/21/21.  This showed unchanged cavitary lesions in the upper lobe with new GGO in RLL and subcentimeter nodules  associated with GGO in LUL and RUL. Likely related to MAC.  His AFB cultures from BAL turned positive at 2 weeks with MAC identified by DNA probe.  He was started on regimen of: azithromycin 567m daily, Moxifloxacin 4028mdaily, Ethambutol 100076maily, and inhaled Amikacin 590m21mily.  After susceptibilities returned on his MAC isolate, Moxifloxacin was discontinued in place of Clofazimine 100mg13mly which was initiated earlier this week.  Of note, rifampin is not being used due to a drug interaction with his Belbuca.  So far he is tolerating all antibiotics without significant GI side effects.  He has no fevers, chills.  He has no new respiratory complaints but does report increased sputum production with arikayce.  He has baseline tinnitus that is unchanged and no changes with color discrimination.  Please see A&P for the details of today's visit and status of the patient's medical problems.   Patient's Medications  New Prescriptions   No medications on file  Previous Medications   AMBULATORY NON FORMULARY MEDICATION    Take 100 mg by mouth daily. Medication Name: clofazimine   AMIKACIN SULFATE LIPOSOME (ARIKAYCE) 590 MG/8.4ML SUSP    Inhale 590 mg into the lungs daily.   AZITHROMYCIN (ZITHROMAX) 500 MG TABLET    Take 1 tablet (500 mg total) by mouth daily.   BELBUCA 450 MCG FILM    Take 450 mcg by mouth 2 (two) times  daily.   ETHAMBUTOL (MYAMBUTOL) 100 MG TABLET    Take two 100 mg tablets WITH two 400 mg tablets of ethambutol together to equal 1000 mg total   ETHAMBUTOL (MYAMBUTOL) 400 MG TABLET    Take two 400 mg tablets WITH two 100 mg tablets of ethambutol together to equal 1000 mg total   IBUPROFEN (ADVIL) 800 MG TABLET    Take 800 mg by mouth in the morning and at bedtime.   MOXIFLOXACIN (AVELOX) 400 MG TABLET    Take 1 tablet (400 mg total) by mouth daily.   MULTIPLE VITAMIN (MULTIVITAMIN WITH MINERALS) TABS TABLET    Take 1 tablet by mouth in the morning. Centrum Gummies   OXYCODONE-ACETAMINOPHEN (PERCOCET) 10-325 MG TABLET    Take 1 tablet by mouth 4 (four) times daily as needed for pain.   PREGABALIN (LYRICA) 100 MG CAPSULE    Take 100 mg by mouth at bedtime.  Modified Medications   No medications on file  Discontinued Medications   OLOPATADINE (PATANOL) 0.1 % OPHTHALMIC SOLUTION    1 drop 2 (two) times daily as needed for allergies.      Past Medical History:  Diagnosis Date   Allergy    Arthritis    Depression    situational (mother passing)   DVT (deep venous thrombosis) (HCC) [place]0   GERD (gastroesophageal reflux disease)    not at this  time   Hx of pulmonary embolus 2014   Impingement syndrome of left ankle    PONV (postoperative nausea and vomiting)    Smoker 12/02/2007   Tobacco use disorder     Social History   Tobacco Use   Smoking status: Every Day    Packs/day: 0.50    Years: 21.00    Pack years: 10.50    Types: Cigarettes   Smokeless tobacco: Former   Tobacco comments:    Pt reports cutting back to 8 cigarettes   Vaping Use   Vaping Use: Former  Substance Use Topics   Alcohol use: Yes    Alcohol/week: 2.0 standard drinks    Types: 2 Standard drinks or equivalent per week    Comment: occasional/social   Drug use: No    Family History  Problem Relation Age of Onset   COPD Mother    Hypertension Father     Allergies  Allergen Reactions   Penicillins  Other (See Comments)    From childhood Did it involve swelling of the face/tongue/throat, SOB, or low BP? Yes Did it involve sudden or severe rash/hives, skin peeling, or any reaction on the inside of your mouth or nose? Unknown Did you need to seek medical attention at a hospital or doctor's office? Unknown When did it last happen? childhood reaction. If all above answers are "NO", may proceed with cephalosporin use.     Review of Systems  All other systems reviewed and are negative. Except as noted above.   OBJECTIVE:    Vitals:   09/26/21 1609  BP: 129/82  Pulse: 74  Temp: 98.6 F (37 C)  TempSrc: Temporal  SpO2: 97%  Weight: 143 lb 6.4 oz (65 kg)   Body mass index is 20.58 kg/m.  Physical Exam Constitutional:      General: He is not in acute distress.    Appearance: Normal appearance.  HENT:     Head: Normocephalic and atraumatic.  Eyes:     Extraocular Movements: Extraocular movements intact.     Conjunctiva/sclera: Conjunctivae normal.  Musculoskeletal:        General: Normal range of motion.  Skin:    General: Skin is warm and dry.  Neurological:     General: No focal deficit present.     Mental Status: He is alert and oriented to person, place, and time.  Psychiatric:        Mood and Affect: Mood normal.        Behavior: Behavior normal.     Labs and Microbiology: CBC Latest Ref Rng & Units 07/05/2021 06/09/2021 02/24/2019  WBC 3.8 - 10.8 Thousand/uL 7.8 9.2 9.9  Hemoglobin 13.2 - 17.1 g/dL 15.6 14.2 15.3  Hematocrit 38.5 - 50.0 % 46.3 41.6 45.6  Platelets 140 - 400 Thousand/uL 237 177 198   CMP Latest Ref Rng & Units 07/05/2021 06/09/2021 02/24/2019  Glucose 65 - 99 mg/dL 76 99 91  BUN 7 - 25 mg/dL _0 Creatinine 0.70 - 1.30 mg/dL 0.75 0.53(L) 0.91  Sodium 135 - 146 mmol/L 141 140 140  Potassium 3.5 - 5.3 mmol/L 4.2 3.8 3.6  Chloride 98 - 110 mmol/L 102 105 105  CO2 20 - 32 mmol/L _1 Calcium 8.6 - 10.3 mg/dL 9.6 9.0 8.5(L)  Total  Protein 6.1 - 8.1 g/dL 7.7 6.7 -  Total Bilirubin 0.2 - 1.2 mg/dL 0.4 0.4 -  Alkaline Phos 38 - 126 U/L - 34(L) -  AST 10 -  35 U/L 19 18 -  ALT 9 - 46 U/L 13 14 -      ASSESSMENT & PLAN:    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  After susceptibilities returned on his MAC isolate, Moxifloxacin was discontinued in place of Clofazimine 100m60mily which he started this week.  He is tolerating regimen well thus far.  Will check CBC and CMP today.  Referral to audiology with long term use of inhaled amikacin placed as well.  He was encouraged to reach out to his pulmonologist regarding any inhalers or other techniques to help with airway clearance.  Will follow up in about 6 weeks and plan for repeat sputum cultures at that time to assess for response to therapy.        AndrRaynelle [facility] 09/26/2021, 4:55 PM

## 2021-09-28 ENCOUNTER — Other Ambulatory Visit (HOSPITAL_COMMUNITY): Payer: Self-pay

## 2021-10-04 ENCOUNTER — Other Ambulatory Visit (HOSPITAL_COMMUNITY): Payer: Self-pay

## 2021-10-04 ENCOUNTER — Ambulatory Visit: Payer: 59 | Attending: Internal Medicine | Admitting: Audiologist

## 2021-10-04 ENCOUNTER — Other Ambulatory Visit: Payer: Self-pay

## 2021-10-04 DIAGNOSIS — H9103 Ototoxic hearing loss, bilateral: Secondary | ICD-10-CM | POA: Diagnosis present

## 2021-10-04 DIAGNOSIS — H903 Sensorineural hearing loss, bilateral: Secondary | ICD-10-CM | POA: Diagnosis not present
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
## 2021-10-05 NOTE — Telephone Encounter (Signed)
His Pa was denied Estill Bamberg wrote letter for appeal and it was denied as well I spoke with Estill Bamberg today about what is our next steps.

## 2021-10-05 NOTE — Telephone Encounter (Signed)
Gerald Fernandez can you look into this please?

## 2021-10-08 ENCOUNTER — Ambulatory Visit: Payer: 59 | Admitting: Pulmonary Disease

## 2021-10-08 ENCOUNTER — Encounter: Payer: Self-pay | Admitting: Pulmonary Disease

## 2021-10-08 ENCOUNTER — Other Ambulatory Visit: Payer: Self-pay

## 2021-10-08 VITALS — BP 90/58 | HR 78 | Temp 98.3°F | Wt 139.4 lb

## 2021-10-08 DIAGNOSIS — I77810 Thoracic aortic ectasia: Secondary | ICD-10-CM

## 2021-10-08 DIAGNOSIS — R21 Rash and other nonspecific skin eruption: Secondary | ICD-10-CM

## 2021-10-08 DIAGNOSIS — R053 Chronic cough: Secondary | ICD-10-CM | POA: Diagnosis not present

## 2021-10-08 DIAGNOSIS — A310 Pulmonary mycobacterial infection: Secondary | ICD-10-CM | POA: Diagnosis not present

## 2021-10-08 MED ORDER — PREDNISONE 10 MG PO TABS: 20.0000 mg | ORAL_TABLET | Freq: Every day | ORAL | 0 refills | Status: AC

## 2021-10-08 MED ORDER — ADVAIR HFA 115-21 MCG/ACT IN AERO: 2.0000 | INHALATION_SPRAY | Freq: Two times a day (BID) | RESPIRATORY_TRACT | 5 refills | Status: DC

## 2021-10-08 NOTE — Patient Instructions (Addendum)
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
## 2021-10-09 ENCOUNTER — Other Ambulatory Visit (HOSPITAL_COMMUNITY): Payer: Self-pay

## 2021-10-09 ENCOUNTER — Telehealth: Payer: Self-pay

## 2021-10-09 NOTE — Telephone Encounter (Signed)
[facility] Patient Advocate Encounter  Prior Authorization for Gerald Fernandez has been approved.    Effective dates: 09/26/21 through 03/25/22  Patients co-pay is $100.00.   I spoke to the patient he will call [employer] Support Program @ [phone], to get a copay coupon card to make the copay $0.00  Prescription was filled on 10/08/21 @[facility] # [phone]  [facility] will continue to follow.  [phone], [facility] Specialty Pharmacy Patient [facility] for Infect[facility] Disease Phone: [phone] Fax:  [phone]

## 2021-10-09 NOTE — Telephone Encounter (Signed)
Thank you Donna 

## 2021-10-15 NOTE — Progress Notes (Signed)
Cardiology Office Note:   Date:  10/18/2021  NAME:  Gerald Fernandez    MRN: [id number] DOB:  07/09/1966   PCP:  Patient, No Pcp Per (Inactive)  Cardiologist:  None  Electrophysiologist:  None   Referring MD: Margaretha Seeds, MD   Chief Complaint  Patient presents with   family history of heart disease    History of Present Illness:   Gerald Fernandez is a 56 y.o. 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 he reports his mother had a history of aortic dissection.  This occurred at age 71.  He reports she was a heavy smoker and had high blood pressure.  He reports she died during the procedure.  His maternal aunt may have had a aorta problem as well.  I have reviewed his recent CT scans.  His ascending aorta measures 34 mm.  I did inform him this is normal for his age.  We did discuss getting an echocardiogram to get a good look at the aortic valve.  His exam is unremarkable.  He has no murmurs.  EKG demonstrates sinus rhythm.  No acute ischemic changes.  He does report exertional shortness of breath but has pulmonary MAC.  He has been treated for this.  He denies any chest pain or pressure.  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  He reports he can complete a high level of activity but does get short of breath.  He does not have high blood pressure.  He is not diabetic.  He still smokes maybe 1 to 2 cigarettes every now and then.  He has been a smoker for 28 years.  Most recent LDL cholesterol 86.  Serum creatinine is normal.  I did review his chest CTs which did show mild coronary calcifications.  We discussed starting statin therapy.  He is okay to do it.  He is married.  He has 2 children.  He reports he has a grandchild.  No strong family history of coronary artery disease.  It appears to be aorta problems.  He did have an echocardiogram that mention dilation of his ascending  aorta.  I believe the CT is more accurate 34 mm.  Problem List Pulmonary MAC Tobacco abuse  Aortic dilation  -34 mm on Chest CT 08/22/2021 -coronary/aortic calcifications 4. PE -provoked   Past Medical History: Past Medical History:  Diagnosis Date   Allergy    Arthritis    Depression    situational (mother passing)   DVT (deep venous thrombosis) ([place]) 2010   GERD (gastroesophageal reflux disease)    not at this time   Hx of pulmonary embolus 2014   Impingement syndrome of left ankle    PONV (postoperative nausea and vomiting)    Smoker 12/02/2007   Tobacco use disorder     Past Surgical History: Past Surgical History:  Procedure Laterality Date   ANKLE ARTHROSCOPY Left    ANKLE FUSION Left 02/24/2019   Procedure: LEFT OPEN ANKLE FUSION;  Surgeon: Newt Minion, MD;  Location: [facility];  Service: Orthopedics;  Laterality: Left;   BRONCHIAL BIOPSY  07/31/2021   Procedure: BRONCHIAL BIOPSIES;  Surgeon: Garner Nash, DO;  Location: [facility];  Service: Pulmonary;;   BRONCHIAL BRUSHINGS  07/31/2021   Procedure: BRONCHIAL BRUSHINGS;  Surgeon: Garner Nash, DO;  Location: [facility];  Service: Pulmonary;;  BRONCHIAL WASHINGS  07/31/2021   Procedure: BRONCHIAL WASHINGS;  Surgeon: Garner Nash, DO;  Location: [facility];  Service: Pulmonary;;   HAND SURGERY Left    fracture    Current Medications: Current Meds  Medication Sig   AMBULATORY NON FORMULARY MEDICATION Take 100 mg by mouth daily. Medication Name: clofazimine   Amikacin Sulfate Liposome (ARIKAYCE) 590 MG/8.4ML SUSP Inhale 590 mg into the lungs daily.   atorvastatin (LIPITOR) 10 MG tablet Take 1 tablet (10 mg total) by mouth daily.   azithromycin (ZITHROMAX) 500 MG tablet Take 1 tablet (500 mg total) by mouth daily.   BELBUCA 450 MCG FILM Take 450 mcg by mouth 2 (two) times daily.   ethambutol (MYAMBUTOL) 100 MG tablet Take two 100 mg tablets WITH two 400 mg tablets of ethambutol together to  equal 1000 mg total   ethambutol (MYAMBUTOL) 400 MG tablet Take two 400 mg tablets WITH two 100 mg tablets of ethambutol together to equal 1000 mg total   fluticasone-salmeterol (ADVAIR HFA) 115-21 MCG/ACT inhaler Inhale 2 puffs into the lungs 2 (two) times daily.   ibuprofen (ADVIL) 800 MG tablet Take 800 mg by mouth in the morning and at bedtime.   moxifloxacin (AVELOX) 400 MG tablet Take 1 tablet (400 mg total) by mouth daily.   Multiple Vitamin (MULTIVITAMIN WITH MINERALS) TABS tablet Take 1 tablet by mouth in the morning. Centrum Gummies   oxyCODONE-acetaminophen (PERCOCET) 10-325 MG tablet Take 1 tablet by mouth 4 (four) times daily as needed for pain.   pregabalin (LYRICA) 100 MG capsule Take 100 mg by mouth at bedtime.   [DISCONTINUED] atorvastatin (LIPITOR) 40 MG tablet Take 1 tablet (40 mg total) by mouth daily.     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, fast food  Tobacco Use   Smoking status: Every Day    Packs/day: 0.50    Years: 28.00    Pack years: 14.00    Types: Cigarettes   Smokeless tobacco: Former   Tobacco comments:    Pt reports cutting back to 8 cigarettes or less per day, bnm 10/08/21  Vaping Use   Vaping Use: Former  Substance and Sexual Activity   Alcohol use: Yes    Alcohol/week: 2.0 standard drinks    Types: 2 Standard drinks or equivalent per week    Comment: occasional/social   Drug use: No   Sexual activity: Yes  Other Topics Concern   Not on file  Social History Narrative   Series of family deaths (1) aunt very close to him of heart disease in Oct 31, 2012, (2) [place] father not close to family after 15 years of service died 10-31-14 of cancer, and (3) mother to whom he was most  close dying in 10/31/2016 after 9 hours of cardiac surgery unexpected having survived breast cancer prior to that.   Social Determinants of Health   Financial Resource Strain: Not on file  Food Insecurity: Not on file  Transportation Needs: Not on file  Physical Activity: Not on file  Stress: Not on file  Social Connections: Not on file     Family History: The patient'sfamily history includes Aortic dissection (age of onset: 1) in his mother;  COPD in his mother; Cancer in his father; Hypertension in his father and mother.  ROS:   All other ROS reviewed and negative. Pertinent positives noted in the HPI.     EKGs/Labs/Other Studies Reviewed:   The following studies were personally reviewed by me today:  EKG:  EKG is  ordered today.  The ekg ordered today demonstrates normal sinus rhythm heart rate 60, no acute ischemic changes or evidence of infarction, and was personally reviewed by me.   TTE 08/22/2021  1. Left ventricular ejection fraction, by estimation, is 50 to 55%. The  left ventricle has low normal function. The left ventricle has no regional  wall motion abnormalities. Left ventricular diastolic parameters were  normal.   2. Right ventricular systolic function is normal. The right ventricular  size is normal.   3. The mitral valve is normal in structure. No evidence of mitral valve  regurgitation. No evidence of mitral stenosis.   4. The aortic valve is normal in structure. Aortic valve regurgitation is  not visualized. No aortic stenosis is present.   5. Aortic dilatation noted. There is mild dilatation of the ascending  aorta, measuring 41 mm.   Recent Labs: 09/26/2021: ALT 11; BUN 15; Creat 0.77; Hemoglobin 14.6; Platelets 228; Potassium 4.1; Sodium 141   Recent Lipid Panel    Component Value Date/Time   CHOL 144 09/03/2018 1530   TRIG 76 09/03/2018 1530   HDL 43 09/03/2018 1530   CHOLHDL 3.3 09/03/2018 1530   LDLCALC 86 09/03/2018 1530    Physical Exam:   VS:   BP 110/60    Pulse 60    Ht _0  (1.778 m)    Wt 136 lb (61.7 kg)    SpO2 98%    BMI 19.51 kg/m    Wt Readings from Last 3 Encounters:  10/18/21 136 lb (61.7 kg)  10/08/21 139 lb 6.4 oz (63.2 kg)  09/26/21 143 lb 6.4 oz (65 kg)    General: Well nourished, well developed, in no acute distress Head: Atraumatic, normal size  Eyes: PEERLA, EOMI  Neck: Supple, no JVD Endocrine: No thryomegaly Cardiac: Normal S1, S2; RRR; no murmurs, rubs, or gallops Lungs: Clear to auscultation bilaterally, no wheezing, rhonchi or rales  Abd: Soft, nontender, no hepatomegaly  Ext: No edema, pulses 2+ Musculoskeletal: No deformities, BUE and BLE strength normal and equal Skin: Warm and dry, no rashes   Neuro: Alert and oriented to person, place, time, and situation, CNII-XII grossly intact, no focal deficits  Psych: Normal mood and affect   ASSESSMENT:   Gerald Fernandez is a 56 y.o. male who presents for the following: 1. Family history of heart disease   2. Aortic ectasia (HCC)   3. Coronary artery calcification seen on CT scan   4. Mixed hyperlipidemia     PLAN:   1. Family history of heart disease 2. Aortic ectasia (HCC) -He reports family history of aortic dissection in his mother at age 61.  She smoked and had high blood pressure.  Most recent chest imaging shows an ascending aorta of 34 mm.  He had an echocardiogram that showed tricuspid aortic valve with dilation of the aorta.  The CT scan is more accurate test.  I do not believe he has an aneurysm of his aorta.  I think this could just be checked in 1 year.  If there is no change I would recommend no further evaluation.  His aorta has been within limits of all of his  scans.  Given his mother's age of 41 I do not believe this is genetic or hereditary.  I believe her aortic dissection is likely related to smoking and high blood pressure.  For him I have recommended to refrain from smoking as well as to exercise regularly.  I do not believe he needs  further evaluation other than a repeat chest CT in 1 year and likely stop screening after that.  3. Coronary artery calcification seen on CT scan 4. Mixed hyperlipidemia -Coronary calcification seen on chest CT.  Would recommend he start Lipitor 10 mg daily.  Smoking cessation advised.  No symptoms of angina.  Goal LDL cholesterol less than 70.  Disposition: Return if symptoms worsen or fail to improve.  Medication Adjustments/Labs and Tests Ordered: Current medicines are reviewed at length with the patient today.  Concerns regarding medicines are outlined above.  Orders Placed This Encounter  Procedures   EKG 12-Lead   Meds ordered this encounter  Medications   DISCONTD: atorvastatin (LIPITOR) 40 MG tablet    Sig: Take 1 tablet (40 mg total) by mouth daily.    Dispense:  90 tablet    Refill:  3   atorvastatin (LIPITOR) 10 MG tablet    Sig: Take 1 tablet (10 mg total) by mouth daily.    Dispense:  90 tablet    Refill:  3    Patient Instructions  Medication Instructions:  Start Lipitor 10 mg daily   *If you need a refill on your cardiac medications before your next appointment, please call your pharmacy*  Follow-Up: At [facility], you and your health needs are our priority.  As part of our continuing mission to provide you with exceptional heart care, we have created designated Provider Care Teams.  These Care Teams include your primary Cardiologist (physician) and Advanced Practice Providers (APPs -  Physician Assistants and Nurse Practitioners) who all work together to provide you with the care you need, when you need it.  We recommend signing up for the patient portal called "MyChart".  Sign up information is provided on this After Visit Summary.  MyChart is used to connect with patients for Virtual Visits (Telemedicine).  Patients are able to view lab/test results, encounter notes, upcoming appointments, etc.  Non-urgent messages can be sent to your provider as well.   To  learn more about what you can do with MyChart, go to [email or web address].    Your next appointment:   As needed  The format for your next appointment:   In Person  Provider:   Eleonore Chiquito, MD      Signed, Addison Naegeli. Audie Box, MD, [place]  [address] [place], [place] [address] [phone]  10/18/2021 10:15 AM

## 2021-10-18 ENCOUNTER — Encounter: Payer: Self-pay | Admitting: Cardiovascular Disease

## 2021-10-18 ENCOUNTER — Other Ambulatory Visit: Payer: Self-pay

## 2021-10-18 ENCOUNTER — Ambulatory Visit: Payer: 59 | Admitting: Cardiovascular Disease

## 2021-10-18 VITALS — BP 110/60 | HR 60 | Ht 70.0 in | Wt 136.0 lb

## 2021-10-18 DIAGNOSIS — I2510 Atherosclerotic heart disease of native coronary artery without angina pectoris: Secondary | ICD-10-CM

## 2021-10-18 DIAGNOSIS — I77819 Aortic ectasia, unspecified site: Secondary | ICD-10-CM

## 2021-10-18 DIAGNOSIS — Z8249 Family history of ischemic heart disease and other diseases of the circulatory system: Secondary | ICD-10-CM | POA: Diagnosis not present

## 2021-10-18 DIAGNOSIS — E782 Mixed hyperlipidemia: Secondary | ICD-10-CM

## 2021-10-18 MED ORDER — ATORVASTATIN CALCIUM 40 MG PO TABS: 40.0000 mg | ORAL_TABLET | Freq: Every day | ORAL | 3 refills | Status: DC

## 2021-10-18 MED ORDER — ATORVASTATIN CALCIUM 10 MG PO TABS: 10.0000 mg | ORAL_TABLET | Freq: Every day | ORAL | 3 refills | Status: DC

## 2021-10-18 NOTE — Patient Instructions (Addendum)
Medication Instructions:  Start Lipitor 10 mg daily   *If you need a refill on your cardiac medications before your next appointment, please call your pharmacy*  Follow-Up: At [facility], you and your health needs are our priority.  As part of our continuing mission to provide you with exceptional heart care, we have created designated Provider Care Teams.  These Care Teams include your primary Cardiologist (physician) and Advanced Practice Providers (APPs -  Physician Assistants and Nurse Practitioners) who all work together to provide you with the care you need, when you need it.  We recommend signing up for the patient portal called "MyChart".  Sign up information is provided on this After Visit Summary.  MyChart is used to connect with patients for Virtual Visits (Telemedicine).  Patients are able to view lab/test results, encounter notes, upcoming appointments, etc.  Non-urgent messages can be sent to your provider as well.   To learn more about what you can do with MyChart, go to [email or web address].    Your next appointment:   As needed  The format for your next appointment:   In Person  Provider:   Eleonore Chiquito, MD

## 2021-10-31 ENCOUNTER — Other Ambulatory Visit: Payer: Self-pay | Admitting: Pulmonary Disease

## 2021-11-07 ENCOUNTER — Other Ambulatory Visit: Payer: Self-pay

## 2021-11-07 ENCOUNTER — Encounter: Payer: Self-pay | Admitting: Internal Medicine

## 2021-11-07 ENCOUNTER — Ambulatory Visit: Payer: 59 | Admitting: Internal Medicine

## 2021-11-07 VITALS — BP 117/73 | HR 90 | Temp 98.1°F | Wt 134.6 lb

## 2021-11-07 DIAGNOSIS — H9193 Unspecified hearing loss, bilateral: Secondary | ICD-10-CM

## 2021-11-07 DIAGNOSIS — A310 Pulmonary mycobacterial infection: Secondary | ICD-10-CM

## 2021-11-07 DIAGNOSIS — R21 Rash and other nonspecific skin eruption: Secondary | ICD-10-CM | POA: Diagnosis not present

## 2021-11-07 DIAGNOSIS — J984 Other disorders of lung: Secondary | ICD-10-CM | POA: Diagnosis not present

## 2021-11-07 DIAGNOSIS — F17200 Nicotine dependence, unspecified, uncomplicated: Secondary | ICD-10-CM | POA: Diagnosis not present

## 2021-11-07 DIAGNOSIS — H9190 Unspecified hearing loss, unspecified ear: Secondary | ICD-10-CM | POA: Insufficient documentation

## 2021-11-07 NOTE — Progress Notes (Signed)
?  ? ? ? ? ?[facility] ? ?CHIEF COMPLAINT:   ? ?Follow up for pulmonary MAC ? ?SUBJECTIVE:   ? ?Gerald Fernandez is a 56 y.o. male with PMHx as below who presents to the clinic for pulmonary MAC.  ? ?Patient is here today for routine follow up after most recent visit on 09/26/21.  He is currently on 4-drug therapy for pulmonary MAC with cavitary lesion.  He is currently on Azithromycin, Ethambutol, Clofazimiine, and inhaled amikacin.  He is tolerating these medications thus far.  Rifampin is not being used due to interaction with Belbuca.  He is doing okay without signifcant side effects.  He was seen by cardiology last month and started on a statin.  No new respiratory complaints but does continue to have sputum production with Arikayce.  He saw Pulmonary on 2/6 and noted a rash possibly due to antibiotics.  He was given low dose prednisone and benadryl for this as needed.   His rash has resolved.  He does report some decreased appetite and difficulty keeping weight up.  Patient also saw Alfonse Alpers from Audiology last month and had mild hearing loss in right ear, moderate loss in left ear.  He will return in 6 months for repeat evaluation.  ? ?Please see A&P for the details of today's visit and status of the patient's medical problems.  ? ?Patient's Medications  ?New Prescriptions  ? No medications on file  ?Previous Medications  ? AMBULATORY NON FORMULARY MEDICATION    Take 100 mg by mouth daily. Medication Name: clofazimine  ? AMIKACIN SULFATE LIPOSOME (ARIKAYCE) 590 MG/8.4ML SUSP    Inhale 590 mg into the lungs daily.  ? ATORVASTATIN (LIPITOR) 10 MG TABLET    Take 1 tablet (10 mg total) by mouth daily.  ? AZITHROMYCIN (ZITHROMAX) 500 MG TABLET    Take 1 tablet (500 mg total) by mouth daily.  ? BELBUCA 450 MCG FILM    Take 450 mcg by mouth 2 (two) times daily.  ? ETHAMBUTOL (MYAMBUTOL) 100 MG TABLET    Take two 100 mg tablets WITH two 400 mg tablets of ethambutol together to equal 1000  mg total  ? ETHAMBUTOL (MYAMBUTOL) 400 MG TABLET    Take two 400 mg tablets WITH two 100 mg tablets of ethambutol together to equal 1000 mg total  ? FLUTICASONE-SALMETEROL (ADVAIR HFA) 115-21 MCG/ACT INHALER    INHALE 2 PUFFS INTO THE LUNGS TWICE A DAY  ? IBUPROFEN (ADVIL) 800 MG TABLET    Take 800 mg by mouth in the morning and at bedtime.  ? MULTIPLE VITAMIN (MULTIVITAMIN WITH MINERALS) TABS TABLET    Take 1 tablet by mouth in the morning. Centrum Gummies  ? OXYCODONE-ACETAMINOPHEN (PERCOCET) 10-325 MG TABLET    Take 1 tablet by mouth 4 (four) times daily as needed for pain.  ? PREGABALIN (LYRICA) 100 MG CAPSULE    Take 100 mg by mouth at bedtime.  ?Modified Medications  ? No medications on file  ?Discontinued Medications  ? MOXIFLOXACIN (AVELOX) 400 MG TABLET    Take 1 tablet (400 mg total) by mouth daily.  ?   ? ?Past Medical History:  ?Diagnosis Date  ? Allergy   ? Arthritis   ? Depression   ? situational (mother passing)  ? DVT (deep venous thrombosis) ([place]) 2010  ? GERD (gastroesophageal reflux disease)   ? not at this time  ? Hx of pulmonary embolus 2014  ? Impingement syndrome of left  ankle   ? PONV (postoperative nausea and vomiting)   ? Smoker 12/02/2007  ? Tobacco use disorder   ? ? ?Social History  ? ?Tobacco Use  ? Smoking status: Every Day  ?  Packs/day: 0.50  ?  Years: 28.00  ?  Pack years: 14.00  ?  Types: Cigarettes  ? Smokeless tobacco: Former  ? Tobacco comments:  ?  Pt reports cutting back to 8 cigarettes or less per day, bnm 10/08/21  ?Vaping Use  ? Vaping Use: Former  ?Substance Use Topics  ? Alcohol use: Yes  ?  Alcohol/week: 2.0 standard drinks  ?  Types: 2 Standard drinks or equivalent per week  ?  Comment: occasional/social  ? Drug use: No  ? ? ?Family History  ?Problem Relation Age of Onset  ? Hypertension Mother   ? COPD Mother   ? Aortic dissection Mother 71  ? Hypertension Father   ? Cancer Father   ? ? ?Allergies  ?Allergen Reactions  ? Penicillins Other (See Comments)  ?  From  childhood ?Did it involve swelling of the face/tongue/throat, SOB, or low BP? Yes ?Did it involve sudden or severe rash/hives, skin peeling, or any reaction on the inside of your mouth or nose? Unknown ?Did you need to seek medical attention at a hospital or doctor's office? Unknown ?When did it last happen? childhood reaction. ?If all above answers are "NO", may proceed with cephalosporin use. ?  ? ? ?Review of Systems  ?All other systems reviewed and are negative. Except as noted above.  ? ? ?OBJECTIVE:   ? ?Vitals:  ? 11/07/21 0911  ?BP: 117/73  ?Pulse: 90  ?Temp: 98.1 ?F (36.7 ?C)  ?TempSrc: Oral  ?SpO2: 97%  ?Weight: 134 lb 9.6 oz (61.1 kg)  ? ?Body mass index is 19.31 kg/m?. ? ?Physical Exam ?Constitutional:   ?   General: He is not in acute distress. ?   Appearance: Normal appearance.  ?HENT:  ?   Head: Normocephalic and atraumatic.  ?Eyes:  ?   Extraocular Movements: Extraocular movements intact.  ?   Conjunctiva/sclera: Conjunctivae normal.  ?Pulmonary:  ?   Effort: Pulmonary effort is normal. No respiratory distress.  ?Abdominal:  ?   General: There is no distension.  ?   Palpations: Abdomen is soft.  ?   Tenderness: There is no abdominal tenderness.  ?Musculoskeletal:  ?   Cervical back: Normal range of motion and neck supple.  ?Skin: ?   General: Skin is warm and dry.  ?   Findings: No rash.  ?Neurological:  ?   General: No focal deficit present.  ?   Mental Status: He is alert and oriented to person, place, and time.  ?Psychiatric:     ?   Mood and Affect: Mood normal.     ?   Behavior: Behavior normal.  ? ? ? ?Labs and Microbiology: ?CBC Latest Ref Rng & Units 09/26/2021 07/05/2021 06/09/2021  ?WBC 3.8 - 10.8 Thousand/uL 9.4 7.8 9.2  ?Hemoglobin 13.2 - 17.1 g/dL 14.6 15.6 14.2  ?Hematocrit 38.5 - 50.0 % 43.7 46.3 41.6  ?Platelets 140 - 400 Thousand/uL 228 237 177  ? ?CMP Latest Ref Rng & Units 09/26/2021 07/05/2021 06/09/2021  ?Glucose 65 - 99 mg/dL 87 76 99  ?BUN 7 - 25 mg/dL _0 ?Creatinine 0.70 -  1.30 mg/dL 0.77 0.75 0.53(L)  ?Sodium 135 - 146 mmol/L 141 141 140  ?Potassium 3.5 - 5.3 mmol/L 4.1 4.2 3.8  ?Chloride  98 - 110 mmol/L 104 102 105  ?CO2 20 - 32 mmol/L 32 30 30  ?Calcium 8.6 - 10.3 mg/dL 9.4 9.6 9.0  ?Total Protein 6.1 - 8.1 g/dL 6.8 7.7 6.7  ?Total Bilirubin 0.2 - 1.2 mg/dL 0.3 0.4 0.4  ?Alkaline Phos 38 - 126 U/L - - 34(L)  ?AST 10 - 35 U/L _0 ?ALT 9 - 46 U/L _1 ?  ? ? ? ?ASSESSMENT & PLAN:   ? 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  Clofazimine was added in January 2023 based on susceptibilities of MAC isolate.  He is on this regimen due to presence of cavitary disease. Will continue with current regimen and check CBC, CMP, sputum cultures for AFB.  He was started Advair last month as well by pulmonary.  RTC 3 months. ? ?Rash ?This has resolved with as needed Benadryl and short course prednisone. ? ?Hearing loss ?Patient will continue to follow up with audiology. ? ? ?Orders Placed This Encounter  ?Procedures  ? MYCOBACTERIA, CULTURE, WITH FLUOROCHROME SMEAR  ? CBC  ? COMPLETE METABOLIC PANEL WITH GFR  ?  ? ? ? ?[facility] ?[place] ?11/07/2021, 9:26 AM ? ? ? ?

## 2021-11-07 NOTE — Assessment & Plan Note (Signed)
Patient will continue to follow up with audiology. ?

## 2021-11-07 NOTE — Assessment & Plan Note (Signed)
This has resolved with as needed Benadryl and short course prednisone. ?

## 2021-11-07 NOTE — Assessment & Plan Note (Signed)
Patient is on azithromycin 568m daily, Ethambutol 10070mdaily, inhaled Amikacin 59025maily since December 2022.  Clofazimine was added in January 2023 based on susceptibilities of MAC isolate.  He is on this regimen due to presence of cavitary disease. Will continue with current regimen and check CBC, CMP, sputum cultures for AFB.  He was started Advair last month as well by pulmonary.  RTC 3 months. ?

## 2021-11-08 LAB — COMPLETE METABOLIC PANEL WITH GFR
AG Ratio: 1.5 (calc) (ref 1.0–2.5)
ALT: 19 U/L (ref 9–46)
AST: 23 U/L (ref 10–35)
Albumin: 4 g/dL (ref 3.6–5.1)
Alkaline phosphatase (APISO): 34 U/L — ABNORMAL LOW (ref 35–144)
BUN: 7 mg/dL (ref 7–25)
CO2: 27 mmol/L (ref 20–32)
Calcium: 9.4 mg/dL (ref 8.6–10.3)
Chloride: 108 mmol/L (ref 98–110)
Creat: 0.75 mg/dL (ref 0.70–1.30)
Globulin: 2.6 g/dL (calc) (ref 1.9–3.7)
Glucose, Bld: 86 mg/dL (ref 65–99)
Potassium: 4.1 mmol/L (ref 3.5–5.3)
Sodium: 144 mmol/L (ref 135–146)
Total Bilirubin: 0.4 mg/dL (ref 0.2–1.2)
Total Protein: 6.6 g/dL (ref 6.1–8.1)
eGFR: 108 mL/min/{1.73_m2} (ref >=60)

## 2021-11-08 LAB — CBC
HCT: 43.7 % (ref 38.5–50.0)
Hemoglobin: 14.8 g/dL (ref 13.2–17.1)
MCH: 31.3 pg (ref 27.0–33.0)
MCHC: 33.9 g/dL (ref 32.0–36.0)
MCV: 92.4 fL (ref 80.0–100.0)
MPV: 10.9 fL (ref 7.5–12.5)
Platelets: 199 10*3/uL (ref 140–400)
RBC: 4.73 10*6/uL (ref 4.20–5.80)
RDW: 11.9 % (ref 11.0–15.0)
WBC: 5.8 10*3/uL (ref 3.8–10.8)

## 2021-12-13 ENCOUNTER — Encounter: Payer: Self-pay | Admitting: Pharmacist

## 2021-12-13 ENCOUNTER — Telehealth: Payer: Self-pay | Admitting: Pharmacist

## 2021-12-13 NOTE — Telephone Encounter (Signed)
2 bottles of clofazimine are located in the pharmacy office when patient needs a refill. Reached out via MyChart to see when refill is needed; awaiting reply. ? ?Gerald Fernandez L. Roiza Wiedel, PharmD ?[facility] Clinical Pharmacist Practitioner ? ?

## 2021-12-21 LAB — MYCOBACTERIA,CULT W/FLUOROCHROME SMEAR
MICRO NUMBER:: 13107436
SPECIMEN QUALITY:: ADEQUATE

## 2021-12-24 ENCOUNTER — Other Ambulatory Visit (HOSPITAL_COMMUNITY): Payer: Self-pay

## 2021-12-25 NOTE — Telephone Encounter (Signed)
Patient picked up on 12/17/21. ? ?Gerald Fernandez L. Eliyanah Elgersma, PharmD ?[facility] Clinical Pharmacist Practitioner ? ?

## 2022-02-06 DIAGNOSIS — Z79899 Other long term (current) drug therapy: Secondary | ICD-10-CM | POA: Diagnosis not present

## 2022-02-06 DIAGNOSIS — M25579 Pain in unspecified ankle and joints of unspecified foot: Secondary | ICD-10-CM | POA: Diagnosis not present

## 2022-02-06 DIAGNOSIS — M19072 Primary osteoarthritis, left ankle and foot: Secondary | ICD-10-CM | POA: Diagnosis not present

## 2022-02-06 DIAGNOSIS — G894 Chronic pain syndrome: Secondary | ICD-10-CM | POA: Diagnosis not present

## 2022-02-06 DIAGNOSIS — G8929 Other chronic pain: Secondary | ICD-10-CM | POA: Diagnosis not present

## 2022-02-07 ENCOUNTER — Encounter: Payer: Self-pay | Admitting: Internal Medicine

## 2022-02-07 ENCOUNTER — Other Ambulatory Visit: Payer: Self-pay

## 2022-02-07 ENCOUNTER — Ambulatory Visit (INDEPENDENT_AMBULATORY_CARE_PROVIDER_SITE_OTHER): Payer: 59 | Admitting: Internal Medicine

## 2022-02-07 VITALS — BP 102/71 | HR 80 | Resp 16 | Ht 70.0 in | Wt 130.4 lb

## 2022-02-07 DIAGNOSIS — A310 Pulmonary mycobacterial infection: Secondary | ICD-10-CM | POA: Diagnosis not present

## 2022-02-07 NOTE — Assessment & Plan Note (Signed)
  Patient is on azithromycin 500mg  daily, ethambutol 1000mg  daily, clofazimine 100mg  daily, and inhaled amikacin.  He has been on treatment since December 2022.  Will repeat labs again today along with AFB culture.  Continue current regimen and continue airway clearance and inhalers.  Will plan for follow up CT scan after next follow up in 2 months.  Advised patient to let know when he needs refills on the above medications.

## 2022-02-07 NOTE — Progress Notes (Signed)
[place] for Infectious Disease  CHIEF COMPLAINT:    Follow up for pulmonary MAC  SUBJECTIVE:    Gerald Fernandez is a 56 y.o. male with PMHx as below who presents to the clinic for pulmonary MAC.   Patient is here today for routine follow up.  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  He is overall tolerating these medications well so far.  His repeat sputum cultures on 11/07/21 was still positive for MAC.  Today, reports doing okay.  He is tolerating antibiotics.  No fevers, chills.  He still has productive cough and in the morning sometimes will cough up a lot of phelgm.  No hemoptysis.  He is realizing how big of a commitment this treatment is.  Please see A&P for the details of today's visit and status of the patient's medical problems.   Patient's Medications  New Prescriptions   No medications on file  Previous Medications   AMBULATORY NON FORMULARY MEDICATION    Take 100 mg by mouth daily. Medication Name: clofazimine   AMIKACIN SULFATE LIPOSOME (ARIKAYCE) 590 MG/8.4ML SUSP    Inhale 590 mg into the lungs daily.   ATORVASTATIN (LIPITOR) 10 MG TABLET    Take 1 tablet (10 mg total) by mouth daily.   AZITHROMYCIN (ZITHROMAX) 500 MG TABLET    Take 1 tablet (500 mg total) by mouth daily.   BELBUCA 450 MCG FILM    Take 450 mcg by mouth 2 (two) times daily.   ETHAMBUTOL (MYAMBUTOL) 100 MG TABLET    Take two 100 mg tablets WITH two 400 mg tablets of ethambutol together to equal 1000 mg total   ETHAMBUTOL (MYAMBUTOL) 400 MG TABLET    Take two 400 mg tablets WITH two 100 mg tablets of ethambutol together to equal 1000 mg total   FLUTICASONE-SALMETEROL (ADVAIR HFA) 115-21 MCG/ACT INHALER    INHALE 2 PUFFS INTO THE LUNGS TWICE A DAY   IBUPROFEN (ADVIL) 800 MG TABLET    Take 800 mg by mouth in the morning and at bedtime.   MULTIPLE  VITAMIN (MULTIVITAMIN WITH MINERALS) TABS TABLET    Take 1 tablet by mouth in the morning. Centrum Gummies   OXYCODONE-ACETAMINOPHEN (PERCOCET) 10-325 MG TABLET    Take 1 tablet by mouth 4 (four) times daily as needed for pain.   PREGABALIN (LYRICA) 100 MG CAPSULE    Take 100 mg by mouth at bedtime.  Modified Medications   No medications on file  Discontinued Medications   No medications on file      Past Medical History:  Diagnosis Date   Allergy    Arthritis    Depression    situational (mother passing)   DVT (deep venous thrombosis) ([place]) 2010   GERD (gastroesophageal reflux disease)    not at this time   Hx of pulmonary embolus 2014   Impingement syndrome of left ankle    PONV (postoperative nausea and vomiting)    Smoker 12/02/2007   Tobacco use disorder     Social History   Tobacco Use   Smoking status: Every Day    Packs/day: 0.50    Years: 28.00    Total pack years: 14.00    Types: Cigarettes   Smokeless tobacco: Former   Tobacco comments:    Pt reports cutting back to 8 cigarettes or less per  day, bnm 10/08/21  Vaping Use   Vaping Use: Former  Substance Use Topics   Alcohol use: Yes    Alcohol/week: 2.0 standard drinks of alcohol    Types: 2 Standard drinks or equivalent per week    Comment: occasional/social   Drug use: No    Family History  Problem Relation Age of Onset   Hypertension Mother    COPD Mother    Aortic dissection Mother 71   Hypertension Father    Cancer Father     Allergies  Allergen Reactions   Penicillins Other (See Comments)    From childhood Did it involve swelling of the face/tongue/throat, SOB, or low BP? Yes Did it involve sudden or severe rash/hives, skin peeling, or any reaction on the inside of your mouth or nose? Unknown Did you need to seek medical attention at a hospital or doctor's office? Unknown When did it last happen? childhood reaction. If all above answers are "NO", may proceed with cephalosporin use.      Review of Systems  All other systems reviewed and are negative. Except as noted above.    OBJECTIVE:    Vitals:   02/07/22 0920  BP: 102/71  Pulse: 80  Resp: 16  SpO2: 97%  Weight: 130 lb 6.4 oz (59.1 kg)  Height: _0  (1.778 m)   Body mass index is 18.71 kg/m.  Physical Exam Constitutional:      General: He is not in acute distress.    Appearance: Normal appearance.  HENT:     Head: Normocephalic and atraumatic.  Eyes:     Extraocular Movements: Extraocular movements intact.     Conjunctiva/sclera: Conjunctivae normal.  Pulmonary:     Effort: Pulmonary effort is normal. No respiratory distress.  Abdominal:     General: There is no distension.     Palpations: Abdomen is soft.  Musculoskeletal:     Cervical back: Normal range of motion and neck supple.  Neurological:     General: No focal deficit present.     Mental Status: He is alert and oriented to person, place, and time.  Psychiatric:        Mood and Affect: Mood normal.        Behavior: Behavior normal.      Labs and Microbiology:    Latest Ref Rng & Units 11/07/2021    9:36 AM 09/26/2021    4:02 PM 07/05/2021    4:15 PM  CBC  WBC 3.8 - 10.8 Thousand/uL 5.8  9.4  7.8   Hemoglobin 13.2 - 17.1 g/dL 14.8  14.6  15.6   Hematocrit 38.5 - 50.0 % 43.7  43.7  46.3   Platelets 140 - 400 Thousand/uL 199  228  237       Latest Ref Rng & Units 11/07/2021    9:36 AM 09/26/2021    4:02 PM 07/05/2021    4:15 PM  CMP  Glucose 65 - 99 mg/dL 86  87  76   BUN 7 - 25 mg/dL _1 Creatinine 0.70 - 1.30 mg/dL 0.75  0.77  0.75   Sodium 135 - 146 mmol/L 144  141  141   Potassium 3.5 - 5.3 mmol/L 4.1  4.1  4.2   Chloride 98 - 110 mmol/L 108  104  102   CO2 20 - 32 mmol/L 27  32  30   Calcium 8.6 - 10.3 mg/dL 9.4  9.4  9.6   Total Protein 6.1 -  8.1 g/dL 6.6  6.8  7.7   Total Bilirubin 0.2 - 1.2 mg/dL 0.4  0.3  0.4   AST 10 - 35 U/L _0 ALT 9 - 46 U/L _1 ASSESSMENT & PLAN:     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  He has been on treatment since December 2022.  Will repeat labs again today along with AFB culture.  Continue current regimen and continue airway clearance and inhalers.  Will plan for follow up CT scan after next follow up in 2 months.  Advised patient to let us [place] when he needs refills on the above medications.    Orders Placed This Encounter  Procedures   MYCOBACTERIA, CULTURE, WITH FLUOROCHROME SMEAR   COMPLETE METABOLIC PANEL WITH GFR   [facility] [place] [place] 02/07/2022, 9:38 AM

## 2022-02-07 NOTE — Patient Instructions (Signed)
Thank you for coming to see me today. It was a pleasure seeing you.  To Do: Continue the current antibiotics Follow up with me in about 2 months Labs and sputum cultures today  If you have any questions or concerns, please do not hesitate to call the office at [phone].  Take Care,   Jule Ser

## 2022-02-27 ENCOUNTER — Other Ambulatory Visit: Payer: Self-pay | Admitting: Pulmonary Disease

## 2022-03-18 ENCOUNTER — Telehealth: Payer: Self-pay

## 2022-03-18 NOTE — Telephone Encounter (Signed)
Left voicemail asking patient to return my call.   Janann Boeve P Elleah Hemsley, CMA  

## 2022-03-18 NOTE — Telephone Encounter (Signed)
-----   Message from Mignon Pine, DO sent at 03/18/2022 10:45 AM EDT ----- Hi Triage -- can you please reach out to patient and see if he is able to come in for an appointment prior to his scheduled visit in August but not urgent?  His sputum cultures came back positive still for MAC and I would like to discuss next steps with him.  If possible, please schedule for 30 min as visit may require extra time to discuss planning and coordination of PICC line, etc.   Thanks, Mitzi Hansen

## 2022-03-20 ENCOUNTER — Other Ambulatory Visit (HOSPITAL_COMMUNITY): Payer: Self-pay

## 2022-03-20 ENCOUNTER — Telehealth: Payer: Self-pay

## 2022-03-20 NOTE — Telephone Encounter (Signed)
[facility] Patient Advocate Encounter   Received notification from [facility] that prior authorization for [facility] is required.   PA submitted on 03/20/22 Key [id number] Status is pending    [facility] will continue to follow.   Clearance Coots, CPhT Specialty Pharmacy Patient [facility] for Infectious Disease Phone: [phone] Fax:  [phone]

## 2022-03-21 ENCOUNTER — Ambulatory Visit (INDEPENDENT_AMBULATORY_CARE_PROVIDER_SITE_OTHER): Payer: 59 | Admitting: Internal Medicine

## 2022-03-21 ENCOUNTER — Other Ambulatory Visit (HOSPITAL_COMMUNITY): Payer: Self-pay

## 2022-03-21 ENCOUNTER — Telehealth: Payer: Self-pay

## 2022-03-21 ENCOUNTER — Other Ambulatory Visit: Payer: Self-pay

## 2022-03-21 ENCOUNTER — Encounter: Payer: Self-pay | Admitting: Internal Medicine

## 2022-03-21 VITALS — BP 110/71 | HR 71 | Temp 97.7°F | Resp 16 | Wt 131.0 lb

## 2022-03-21 DIAGNOSIS — A310 Pulmonary mycobacterial infection: Secondary | ICD-10-CM

## 2022-03-21 NOTE — Progress Notes (Signed)
Counseled patient on IV amikacin therapy including side effects, what to expect, and home health process. He will go next week to get a PICC line placed. Advised on weekly labs and monitoring of ototoxicity and nephrotoxicity. Answered all questions. Dose is 1200 mg three times weekly.  Xavier Fournier L. Brooklinn Longbottom, PharmD, BCIDP, AAHIVP, CPP Clinical Pharmacist Practitioner Infectious Diseases Clinical Pharmacist [facility] 03/21/2022, 12:09 PM

## 2022-03-21 NOTE — Patient Instructions (Signed)
Thank you for coming to see me today. It was a pleasure seeing you.  To Do: Follow up in 6 weeks We are going to start IV amikacin and arrange for a PICC line Continue taking your other oral medications: Ethambutol, Clofazimine, and Azithromycin Stop doing the inhaled amikacin once starting on IV Follow up with Ammie Ferrier at audiology I have ordered a follow up CT chest to be scheduled  If you have any questions or concerns, please do not hesitate to call the office at [phone].  Take Care,   Gwynn Burly

## 2022-03-21 NOTE — Assessment & Plan Note (Signed)
Discussed at length with patient.  He has been on 4-drug therapy since December 2022 and reports consistent adherence.  His cultures are still positive despite 6 months of treatment so we discussed options of continuing with current regimen and repeating AFB cultures today vs adding IV amikacin in addition to his PO therapy.  He may also require surgical management in the future as well but this would likely require referral to [facility] or [facility].  Today, he opts to add IV amikacin.  Therefore, will proceed with PICC line and add amikacin 1267m three times per week.  Will initially order for 12 weeks of therapy.  I will see him back in 6 weeks to see how he is doing and repeat his sputum cultures.  Will also stop inhaled amikacin.  He will otherwise continue azithromycin, ethambutol, and clofazimine.  I asked him to make a follow up appointment with audiology since he is approaching 6 months since his previous visit.  We will also await his susceptibility testing to see if any changes need to be made.   Diagnosis: Pulmonary MAC   Allergies  Allergen Reactions  . Penicillins Other (See Comments)    From childhood Did it involve swelling of the face/tongue/throat, SOB, or low BP? Yes Did it involve sudden or severe rash/hives, skin peeling, or any reaction on the inside of your mouth or nose? Unknown Did you need to seek medical attention at a hospital or doctor's office? Unknown When did it last happen? childhood reaction. If all above answers are "NO", may proceed with cephalosporin use.     OPAT Orders Discharge antibiotics to be given via PICC line Discharge antibiotics: Per pharmacy protocol  Amikacin 12010mthree times per week  Duration: 12 weeks from date of PICC line placement  PIC Care Per Protocol:  Home health RN for IV administration and teaching; PICC line care and labs.    Labs weekly while on IV antibiotics:  _xxx_ BMP _xxx_ Weekly Amikacin trough prior to infusion   __  Please pull PIC at completion of IV antibiotics _xxx_ Please leave PIC in place until doctor has seen patient or been notified  Fax weekly labs to [phone]

## 2022-03-21 NOTE — Telephone Encounter (Signed)
New OPAT orders per Dr. Earlene Plater , orders shared with Jeri Modena, RN at [facility] and [facility] pharmacy staff.   IR appointment: Monday 03/25/2022 at 9 AM  - appointment time and location provided to both patient and [facility].   First dose: TBD - will update once I hear back from Jeri Modena, RN.   Marland KitchenTiffany Lesli Albee, CMA

## 2022-03-21 NOTE — Telephone Encounter (Signed)
[facility] Patient Advocate Encounter  Prior Authorization for Gerald Fernandez has been approved.    PA# [id number] Effective dates: 03/20/22 through 09/16/22  Patients co-pay is $0.00.   [facility] will continue to follow.  Clearance Gerald Fernandez, CPhT Specialty Pharmacy Patient [facility] for Infectious Disease Phone: [phone] Fax:  [phone]

## 2022-03-21 NOTE — Progress Notes (Signed)
[place] for Infectious Disease  CHIEF COMPLAINT:    Follow up for pulmonary MAC  SUBJECTIVE:    Gerald Fernandez is a 56 y.o. male with PMHx as below who presents to the clinic for pulmonary MAC.   Patient is here today for sooner than anticipated follow up.  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  His repeat sputum smears were negative, however, cultures have now started growing MAC again this week.  Susceptibility testing has been requested and is currently pending.  He otherwise reports today that he is feeling okay.  He reports pretty good adherence to his regimen with maybe a couple missed days of treatment per month.  He is otherwise tolerating antibiotics well.  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  Please see A&P for the details of today's visit and status of the patient's medical problems.   Patient's Medications  New Prescriptions   No medications on file  Previous Medications   ADVAIR HFA 115-21 MCG/ACT INHALER    INHALE 2 PUFFS INTO THE LUNGS TWICE A DAY   AMBULATORY NON FORMULARY MEDICATION    Take 100 mg by mouth daily. Medication Name: clofazimine   AMIKACIN SULFATE LIPOSOME (ARIKAYCE) 590 MG/8.4ML SUSP    Inhale 590 mg into the lungs daily.   ATORVASTATIN (LIPITOR) 10 MG TABLET    Take 1 tablet (10 mg total) by mouth daily.   AZITHROMYCIN (ZITHROMAX) 500 MG TABLET    Take 1 tablet (500 mg total) by mouth daily.   BELBUCA 450 MCG FILM    Take 450 mcg by mouth 2 (two) times daily.   DRONABINOL (MARINOL) 5 MG CAPSULE    Take 5 mg by mouth daily.   ETHAMBUTOL (MYAMBUTOL) 100 MG TABLET    Take two 100 mg tablets WITH two 400 mg tablets of ethambutol together to equal 1000 mg total   ETHAMBUTOL (MYAMBUTOL) 400 MG TABLET    Take two 400 mg tablets WITH two 100 mg tablets of ethambutol together to  equal 1000 mg total   IBUPROFEN (ADVIL) 800 MG TABLET    Take 800 mg by mouth in the morning and at bedtime.   MULTIPLE VITAMIN (MULTIVITAMIN WITH MINERALS) TABS TABLET    Take 1 tablet by mouth in the morning. Centrum Gummies   ONDANSETRON (ZOFRAN) 4 MG TABLET    Take 4 mg by mouth 2 (two) times daily as needed.   OXYCODONE-ACETAMINOPHEN (PERCOCET) 10-325 MG TABLET    Take 1 tablet by mouth 4 (four) times daily as needed for pain.   PREGABALIN (LYRICA) 100 MG CAPSULE    Take 100 mg by mouth at bedtime.  Modified Medications   No medications on file  Discontinued Medications   No medications on file      Past Medical History:  Diagnosis Date   Allergy    Arthritis    Depression    situational (mother passing)   DVT (deep venous thrombosis) ([place]) 2010   GERD (gastroesophageal reflux disease)    not at this time   Hx of pulmonary embolus 2014   Impingement syndrome of left ankle    PONV (postoperative nausea and vomiting)    Smoker 12/02/2007   Tobacco use disorder     Social History   Tobacco Use   Smoking status: Every  Day    Packs/day: 0.50    Years: 28.00    Total pack years: 14.00    Types: Cigarettes   Smokeless tobacco: Former   Tobacco comments:    Pt reports cutting back to 8 cigarettes or less per day, bnm 10/08/21  Vaping Use   Vaping Use: Former  Substance Use Topics   Alcohol use: Yes    Alcohol/week: 2.0 standard drinks of alcohol    Types: 2 Standard drinks or equivalent per week    Comment: occasional/social   Drug use: No    Family History  Problem Relation Age of Onset   Hypertension Mother    COPD Mother    Aortic dissection Mother 67   Hypertension Father    Cancer Father     Allergies  Allergen Reactions   Penicillins Other (See Comments)    From childhood Did it involve swelling of the face/tongue/throat, SOB, or low BP? Yes Did it involve sudden or severe rash/hives, skin peeling, or any reaction on the inside of your mouth or nose?  Unknown Did you need to seek medical attention at a hospital or doctor's office? Unknown When did it last happen? childhood reaction. If all above answers are "NO", may proceed with cephalosporin use.     Review of Systems  All other systems reviewed and are negative. Except as noted above.   OBJECTIVE:    Vitals:   03/21/22 1105  BP: 110/71  Pulse: 71  Resp: 16  Temp: 97.7 F (36.5 C)  TempSrc: Oral  SpO2: 98%  Weight: 131 lb (59.4 kg)   Body mass index is 18.8 kg/m.  Physical Exam Constitutional:      General: He is not in acute distress.    Appearance: Normal appearance.  HENT:     Head: Normocephalic and atraumatic.  Eyes:     Extraocular Movements: Extraocular movements intact.     Conjunctiva/sclera: Conjunctivae normal.  Pulmonary:     Effort: Pulmonary effort is normal. No respiratory distress.  Abdominal:     General: There is no distension.     Palpations: Abdomen is soft.  Musculoskeletal:        General: Normal range of motion.     Cervical back: Normal range of motion and neck supple.  Skin:    General: Skin is warm and dry.     Findings: No rash.  Neurological:     General: No focal deficit present.     Mental Status: He is alert and oriented to person, place, and time.  Psychiatric:        Mood and Affect: Mood normal.        Behavior: Behavior normal.      Labs and Microbiology:    Latest Ref Rng & Units 02/07/2022    9:42 AM 11/07/2021    9:36 AM 09/26/2021    4:02 PM  CBC  WBC 3.8 - 10.8 Thousand/uL 7.3  5.8  9.4   Hemoglobin 13.2 - 17.1 g/dL 14.8  14.8  14.6   Hematocrit 38.5 - 50.0 % 43.8  43.7  43.7   Platelets 140 - 400 Thousand/uL 215  199  228       Latest Ref Rng & Units 02/07/2022    9:42 AM 11/07/2021    9:36 AM 09/26/2021    4:02 PM  CMP  Glucose 65 - 99 mg/dL 83  86  87   BUN 7 - 25 mg/dL _0 Creatinine  0.70 - 1.30 mg/dL 0.76  0.75  0.77   Sodium 135 - 146 mmol/L 143  144  141   Potassium 3.5 - 5.3 mmol/L 4.8   4.1  4.1   Chloride 98 - 110 mmol/L 107  108  104   CO2 20 - 32 mmol/L 32  27  32   Calcium 8.6 - 10.3 mg/dL 9.3  9.4  9.4   Total Protein 6.1 - 8.1 g/dL 6.6  6.6  6.8   Total Bilirubin 0.2 - 1.2 mg/dL 0.5  0.4  0.3   AST 10 - 35 U/L _0 ALT 9 - 46 U/L _1 ASSESSMENT & PLAN:    Pulmonary Mycobacterium avium complex (MAC) infection ([facility]) Discussed at length with patient.  He has been on 4-drug therapy since December 2022 and reports consistent adherence.  His cultures are still positive despite 6 months of treatment so we discussed options of continuing with current regimen and repeating AFB cultures today vs adding IV amikacin in addition to his PO therapy.  He may also require surgical management in the future as well but this would likely require referral to [facility] or [facility].  Today, he opts to add IV amikacin.  Therefore, will proceed with PICC line and add amikacin 1273m three times per week.  Will initially order for 12 weeks of therapy.  I will see him back in 6 weeks to see how he is doing and repeat his sputum cultures.  Will also stop inhaled amikacin.  He will otherwise continue azithromycin, ethambutol, and clofazimine.  I asked him to make a follow up appointment with audiology since he is approaching 6 months since his previous visit.  We will also await his susceptibility testing to see if any changes need to be made.   Diagnosis: Pulmonary MAC   Allergies  Allergen Reactions   Penicillins Other (See Comments)    From childhood Did it involve swelling of the face/tongue/throat, SOB, or low BP? Yes Did it involve sudden or severe rash/hives, skin peeling, or any reaction on the inside of your mouth or nose? Unknown Did you need to seek medical attention at a hospital or doctor's office? Unknown When did it last happen? childhood reaction. If all above answers are "NO", may proceed with cephalosporin use.     OPAT Orders Discharge antibiotics to be  given via PICC line Discharge antibiotics: Per pharmacy protocol  Amikacin 12010mthree times per week  Duration: 12 weeks from date of PICC line placement  PIC Care Per Protocol:  Home health RN for IV administration and teaching; PICC line care and labs.    Labs weekly while on IV antibiotics:  _xxx_ BMP _xxx_ Weekly Amikacin trough prior to infusion   __ Please pull PIC at completion of IV antibiotics _xxx_ Please leave PIC in place until doctor has seen patient or been notified  Fax weekly labs to ([phone] Orders Placed This Encounter  Procedures   CT CHEST WO CONTRAST    Standing Status:   Future    Standing Expiration Date:   03/22/2023    Order Specific Question:   Preferred imaging location?    Answer:   [facility] IR PICC PLACEMENT LEFT >5 YRS INC IMG GUIDE    Standing Status:   Future    Standing Expiration Date:   03/22/2023    Order Specific  Question:   Reason for Exam (SYMPTOM  OR DIAGNOSIS REQUIRED)    Answer:   long term IV therapy    Order Specific Question:   Preferred Imaging Location?    Answer:   [place] for Infectious [facility] 03/21/2022, 12:35 PM   I have personally spent 40 minutes involved in face-to-face and non-face-to-face activities for this patient on the day of the visit. Professional time spent includes the following activities: Preparing to see the patient (review of tests), Obtaining and/or reviewing separately obtained history (admission/discharge record), Performing a medically appropriate examination and/or evaluation , Ordering medications/tests/procedures, referring and communicating with other health care professionals, Documenting clinical information in the EMR, Independently interpreting results (not separately reported), Communicating results to the patient/family/caregiver, Counseling and educating the patient/family/caregiver and  Care coordination (not separately reported).

## 2022-03-25 ENCOUNTER — Ambulatory Visit (HOSPITAL_COMMUNITY)
Admission: RE | Admit: 2022-03-25 | Discharge: 2022-03-25 | Disposition: A | Discharge status: Home / Self Care | Payer: 59 | Source: Ambulatory Visit | Attending: Internal Medicine | Admitting: Internal Medicine

## 2022-03-25 ENCOUNTER — Other Ambulatory Visit: Payer: Self-pay | Admitting: Internal Medicine

## 2022-03-25 DIAGNOSIS — A310 Pulmonary mycobacterial infection: Secondary | ICD-10-CM | POA: Diagnosis not present

## 2022-03-25 DIAGNOSIS — A312 Disseminated mycobacterium avium-intracellulare complex (DMAC): Secondary | ICD-10-CM | POA: Diagnosis not present

## 2022-03-25 DIAGNOSIS — Z452 Encounter for adjustment and management of vascular access device: Secondary | ICD-10-CM | POA: Diagnosis not present

## 2022-03-25 MED ORDER — LIDOCAINE HCL 1 % IJ SOLN
INTRAMUSCULAR | Status: AC
  Administered 2022-03-25: 5 mL
  Filled 2022-03-25: qty 20

## 2022-03-25 NOTE — Procedures (Signed)
PROCEDURE SUMMARY:  Successful placement of single lumen PICC line to right basilic vein. Length 43 cm Tip at lower SVC/RA No complications PICC capped Ready for use. EBL = trace  Please see full dictation in Imaging section for details.   Tanashia Ciesla S Earle Troiano PA-C 03/25/2022 9:49 AM

## 2022-03-26 DIAGNOSIS — A310 Pulmonary mycobacterial infection: Secondary | ICD-10-CM | POA: Diagnosis not present

## 2022-03-28 ENCOUNTER — Ambulatory Visit (HOSPITAL_COMMUNITY)
Admission: RE | Admit: 2022-03-28 | Discharge: 2022-03-28 | Disposition: A | Discharge status: Home / Self Care | Payer: 59 | Source: Ambulatory Visit | Attending: Internal Medicine | Admitting: Internal Medicine

## 2022-03-28 DIAGNOSIS — A310 Pulmonary mycobacterial infection: Secondary | ICD-10-CM | POA: Insufficient documentation

## 2022-03-28 DIAGNOSIS — R918 Other nonspecific abnormal finding of lung field: Secondary | ICD-10-CM | POA: Diagnosis not present

## 2022-03-28 DIAGNOSIS — J181 Lobar pneumonia, unspecified organism: Secondary | ICD-10-CM | POA: Diagnosis not present

## 2022-04-08 ENCOUNTER — Telehealth: Payer: Self-pay | Admitting: Pharmacist

## 2022-04-08 NOTE — Telephone Encounter (Signed)
2 bottles of clofazimine are located in the pharmacy office when patient needs a refill.  Jocob Dambach L. Sota Hetz, PharmD, BCIDP, AAHIVP, CPP Clinical Pharmacist Practitioner Infectious Diseases Clinical Pharmacist [facility] 04/08/2022, 12:07 PM

## 2022-04-09 ENCOUNTER — Telehealth: Payer: Self-pay | Admitting: Pharmacist

## 2022-04-09 ENCOUNTER — Ambulatory Visit (INDEPENDENT_AMBULATORY_CARE_PROVIDER_SITE_OTHER): Payer: 59 | Admitting: Internal Medicine

## 2022-04-09 ENCOUNTER — Encounter: Payer: Self-pay | Admitting: Internal Medicine

## 2022-04-09 ENCOUNTER — Other Ambulatory Visit: Payer: Self-pay

## 2022-04-09 DIAGNOSIS — A310 Pulmonary mycobacterial infection: Secondary | ICD-10-CM

## 2022-04-09 DIAGNOSIS — F17200 Nicotine dependence, unspecified, uncomplicated: Secondary | ICD-10-CM

## 2022-04-09 DIAGNOSIS — F17210 Nicotine dependence, cigarettes, uncomplicated: Secondary | ICD-10-CM | POA: Diagnosis not present

## 2022-04-09 NOTE — Assessment & Plan Note (Signed)
Would benefit from cessation.

## 2022-04-09 NOTE — Assessment & Plan Note (Signed)
Patient will continue current regimen of amikacin 1271m IV TIW, azithromycin 5024mPO daily, ethambutol 100035mO daily, and Clofazimine 100m7m daily  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  Fortunately, repeat CT scan showed marked improvement from prior and he is tolerating therapy so far.  His labs will be monitored via OPAT but will obtain sputum cultures again today.  Recommended that he follow up with audiology as previously discussed.  He is also interested in a [facility] opinion [facility].  Discussed with patient that he may be a surgical candidate in the future as well and this would [facility] be [facility].

## 2022-04-09 NOTE — Progress Notes (Signed)
[facility]  CHIEF COMPLAINT:    Follow up for pulmonary MAC  SUBJECTIVE:    Gerald Fernandez is a 56 y.o. male with PMHx as below who presents to the clinic for pulmonary MAC.   Patient is here today for follow up.  Last seen on 03/21/22 and had PICC line placed 03/25/22 to start IV amikacin 1256m TIW.  He is tolerating well thus far and reports no side effects that he is aware of.  He has not yet followed up with audiology.  He is currently on Amikacin IV, azithromycin 5062mPO daily, ethambutol 100083mO daily, and Clofazimine 100m53m daily.  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  He has been on treatment since December 2022 with reported adherence and persistently positive AFB cultures.  His most recent culture from 02/07/22 remains positive for MAC with susceptibilities pending.  He continues to smoke but has decreased his usage.   He had a repeat CT chest last week showing: IMPRESSION: 1. Interval resolution of the central cavitation within the bilateral upper lobe lesions as above, with no significant change in size. Findings are consistent with given history of MAC. 2. Decreased nodular consolidation within the medial right upper lobe, with near complete resolution of the scattered ground-glass opacities throughout the remainders of the lung seen previously. Overall marked improvement.  Please see A&P for the details of today's visit and status of the patient's medical problems.   Patient's Medications  New Prescriptions   No medications on file  Previous Medications   ADVAIR HFA 115-21 MCG/ACT INHALER    INHALE 2 PUFFS INTO THE LUNGS TWICE A DAY   AMBULATORY NON FORMULARY MEDICATION    Take 100 mg by mouth daily. Medication Name: clofazimine   AMIKACIN (AMIKIN) IVPB    Inject into the vein.   AMIKACIN SULFATE LIPOSOME (ARIKAYCE) 590 MG/8.4ML SUSP    Inhale 590 mg into the lungs daily.   ATORVASTATIN (LIPITOR) 10  MG TABLET    Take 1 tablet (10 mg total) by mouth daily.   AZITHROMYCIN (ZITHROMAX) 500 MG TABLET    Take 1 tablet (500 mg total) by mouth daily.   BELBUCA 450 MCG FILM    Take 450 mcg by mouth 2 (two) times daily.   DRONABINOL (MARINOL) 5 MG CAPSULE    Take 5 mg by mouth daily.   ETHAMBUTOL (MYAMBUTOL) 100 MG TABLET    Take two 100 mg tablets WITH two 400 mg tablets of ethambutol together to equal 1000 mg total   ETHAMBUTOL (MYAMBUTOL) 400 MG TABLET    Take two 400 mg tablets WITH two 100 mg tablets of ethambutol together to equal 1000 mg total   IBUPROFEN (ADVIL) 800 MG TABLET    Take 800 mg by mouth in the morning and at bedtime.   MULTIPLE VITAMIN (MULTIVITAMIN WITH MINERALS) TABS TABLET    Take 1 tablet by mouth in the morning. Centrum Gummies   ONDANSETRON (ZOFRAN) 4 MG TABLET    Take 4 mg by mouth 2 (two) times daily as needed.   OXYCODONE-ACETAMINOPHEN (PERCOCET) 10-325 MG TABLET    Take 1 tablet by mouth 4 (four) times daily as needed for pain.   PREGABALIN (LYRICA) 100 MG CAPSULE    Take 100 mg by mouth at bedtime.  Modified Medications   No medications on file  Discontinued Medications   No medications on file  Past Medical History:  Diagnosis Date   Allergy    Arthritis    Depression    situational (mother passing)   DVT (deep venous thrombosis) ([place]) 2010   GERD (gastroesophageal reflux disease)    not at this time   Hx of pulmonary embolus 2014   Impingement syndrome of left ankle    PONV (postoperative nausea and vomiting)    Smoker 12/02/2007   Tobacco use disorder     Social History   Tobacco Use   Smoking status: Every Day    Packs/day: 0.50    Years: 28.00    Total pack years: 14.00    Types: Cigarettes   Smokeless tobacco: Former   Tobacco comments:    Pt reports cutting back to 8 cigarettes or less per day, bnm 10/08/21  Vaping Use   Vaping Use: Former  Substance Use Topics   Alcohol use: Yes    Alcohol/week: 2.0 standard drinks of alcohol     Types: 2 Standard drinks or equivalent per week    Comment: occasional/social   Drug use: No    Family History  Problem Relation Age of Onset   Hypertension Mother    COPD Mother    Aortic dissection Mother 61   Hypertension Father    Cancer Father     Allergies  Allergen Reactions   Penicillins Other (See Comments)    From childhood Did it involve swelling of the face/tongue/throat, SOB, or low BP? Yes Did it involve sudden or severe rash/hives, skin peeling, or any reaction on the inside of your mouth or nose? Unknown Did you need to seek medical attention at a hospital or doctor's office? Unknown When did it last happen? childhood reaction. If all above answers are "NO", may proceed with cephalosporin use.     Review of Systems  Constitutional:  Negative for fever.  Respiratory:  Positive for cough.   Gastrointestinal: Negative.   All other systems reviewed and are negative.    OBJECTIVE:    Vitals:   04/09/22 0951  BP: 108/71  Pulse: 68  Resp: 16  SpO2: 100%  Weight: 131 lb (59.4 kg)  Height: _0  (1.778 m)   Body mass index is 18.8 kg/m.  Physical Exam Constitutional:      Appearance: Normal appearance.  HENT:     Head: Normocephalic and atraumatic.  Pulmonary:     Effort: Pulmonary effort is normal. No respiratory distress.  Musculoskeletal:     Comments: Upper extremity PICC line in place  Skin:    General: Skin is warm and dry.  Neurological:     General: No focal deficit present.     Mental Status: He is alert and oriented to person, place, and time.  Psychiatric:        Mood and Affect: Mood normal.        Behavior: Behavior normal.      Labs and Microbiology:    Latest Ref Rng & Units 02/07/2022    9:42 AM 11/07/2021    9:36 AM 09/26/2021    4:02 PM  CBC  WBC 3.8 - 10.8 Thousand/uL 7.3  5.8  9.4   Hemoglobin 13.2 - 17.1 g/dL 14.8  14.8  14.6   Hematocrit 38.5 - 50.0 % 43.8  43.7  43.7   Platelets 140 - 400 Thousand/uL 215  199   228       Latest Ref Rng & Units 02/07/2022    9:42 AM 11/07/2021    9:36  AM 09/26/2021    4:02 PM  CMP  Glucose 65 - 99 mg/dL 83  86  87   BUN 7 - 25 mg/dL _0 Creatinine 0.70 - 1.30 mg/dL 0.76  0.75  0.77   Sodium 135 - 146 mmol/L 143  144  141   Potassium 3.5 - 5.3 mmol/L 4.8  4.1  4.1   Chloride 98 - 110 mmol/L 107  108  104   CO2 20 - 32 mmol/L 32  27  32   Calcium 8.6 - 10.3 mg/dL 9.3  9.4  9.4   Total Protein 6.1 - 8.1 g/dL 6.6  6.6  6.8   Total Bilirubin 0.2 - 1.2 mg/dL 0.5  0.4  0.3   AST 10 - 35 U/L _1 ALT 9 - 46 U/L _2 ASSESSMENT & PLAN:    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  Fortunately, repeat CT scan showed marked improvement from prior and he is tolerating therapy so far.  His labs will be monitored via OPAT but will obtain sputum cultures again today.  Recommended that he follow up with audiology as previously discussed.  He is also interested in [facility].  Discussed with patient that he may be a surgical candidate in the future as well and this would likely be better [facility] an [facility] myself.   Current smoker Would benefit from cessation.    Orders Placed This Encounter  Procedures   MYCOBACTERIA, CULTURE, WITH FLUOROCHROME SMEAR   Ambulatory referral to Infectious Disease    Referral Priority:   Routine    Referral Type:   Consultation    Referral Reason:   Specialty Services Required    Requested Specialty:   Infectious Diseases    Number of Visits Requested:   1   [facility] 04/09/2022, 10:08 AM   I have personally spent 30 minutes involved in face-to-face and non-face-to-face activities for this patient on the day of the visit. Professional time spent includes the following activities: Preparing to see the patient (review of tests), Obtaining and/or reviewing separately obtained history (admission/discharge record), Performing a medically appropriate examination and/or evaluation , Ordering medications/tests/procedures, referring and communicating with other health care professionals, Documenting clinical information in the EMR, Independently interpreting results (not separately reported), Communicating results to the patient/family/caregiver, Counseling and educating the patient/family/caregiver and Care coordination (not separately reported).

## 2022-04-09 NOTE — Telephone Encounter (Signed)
Patient picked up 2 bottles of clofazimine today. Supply should last for ~3 months. Next refill due around beginning of November.  Jermall Isaacson L. Jannette Fogo, PharmD, BCIDP, AAHIVP, CPP Clinical Pharmacist Practitioner Infectious Diseases Clinical Pharmacist [facility] 04/09/2022, 10:55 AM

## 2022-04-12 LAB — COMPLETE METABOLIC PANEL WITH GFR
AG Ratio: 1.6 (calc) (ref 1.0–2.5)
ALT: 17 U/L (ref 9–46)
AST: 20 U/L (ref 10–35)
Albumin: 4.1 g/dL (ref 3.6–5.1)
Alkaline phosphatase (APISO): 41 U/L (ref 35–144)
BUN: 10 mg/dL (ref 7–25)
CO2: 32 mmol/L (ref 20–32)
Calcium: 9.3 mg/dL (ref 8.6–10.3)
Chloride: 107 mmol/L (ref 98–110)
Creat: 0.76 mg/dL (ref 0.70–1.30)
Globulin: 2.5 g/dL (calc) (ref 1.9–3.7)
Glucose, Bld: 83 mg/dL (ref 65–99)
Potassium: 4.8 mmol/L (ref 3.5–5.3)
Sodium: 143 mmol/L (ref 135–146)
Total Bilirubin: 0.5 mg/dL (ref 0.2–1.2)
Total Protein: 6.6 g/dL (ref 6.1–8.1)
eGFR: 107 mL/min/{1.73_m2} (ref >=60)

## 2022-04-12 LAB — M. AVIUM MIC PANEL
AMIKACIN (LIPOSOMAL, INHALED): 32 ug/mL
AMIKACIN: 32 ug/mL
CIPROFLOXACIN: >8 ug/mL
CLARITHROMYCIN: 4 ug/mL
CLOFAZIMINE: 0.12 ug/mL
DOXYCYCLINE: >8 ug/mL
LINEZOLID: >32 ug/mL
MINOCYCLINE: >8 ug/mL
MOXIFLOXACIN: >4 ug/mL
RIFABUTIN: 0.5 ug/mL
RIFAMPIN: >4 ug/mL
STREPTOMYCIN: >32 ug/mL

## 2022-04-12 LAB — CBC
HCT: 43.8 % (ref 38.5–50.0)
Hemoglobin: 14.8 g/dL (ref 13.2–17.1)
MCH: 31.7 pg (ref 27.0–33.0)
MCHC: 33.8 g/dL (ref 32.0–36.0)
MCV: 93.8 fL (ref 80.0–100.0)
MPV: 10.9 fL (ref 7.5–12.5)
Platelets: 215 10*3/uL (ref 140–400)
RBC: 4.67 10*6/uL (ref 4.20–5.80)
RDW: 11.9 % (ref 11.0–15.0)
WBC: 7.3 10*3/uL (ref 3.8–10.8)

## 2022-04-12 LAB — MYCOBACTERIA,CULT W/FLUOROCHROME SMEAR
MICRO NUMBER:: 13505409
SMEAR:: NONE SEEN
SPECIMEN QUALITY:: ADEQUATE

## 2022-05-21 ENCOUNTER — Other Ambulatory Visit (HOSPITAL_COMMUNITY): Payer: Self-pay

## 2022-05-22 ENCOUNTER — Other Ambulatory Visit (HOSPITAL_COMMUNITY): Payer: Self-pay

## 2022-05-22 ENCOUNTER — Telehealth: Payer: Self-pay

## 2022-05-22 NOTE — Telephone Encounter (Signed)
Received notification from [employer] regarding a prior authorization for Advair HFA 115-21MCG/ACT aerosol.   PA submitted: Key: [id number]  Awaiting determination

## 2022-05-27 NOTE — Telephone Encounter (Signed)
Received a fax regarding Prior Authorization for Advair HFA 115-21MCG/ACT aerosol. .   Authorization has been DENIED due to the following reasons: Coverage is provided for asthma or reactive airway disease, chronic obstructive pulmonary disease, emphysema, chronic bronchitis, and postinfection cough. Please note: Postinfection cough is cough that persists after an acute respiratory infection has resolved. Coverage cannot be authorized at this time.  Determination letter attached to patients chart.

## 2022-06-04 ENCOUNTER — Encounter: Payer: Self-pay | Admitting: Pulmonary Disease

## 2022-06-04 NOTE — Telephone Encounter (Signed)
Mychart message sent to patient:   Gerald Fernandez,   I am contacting you regarding your Advair inhaler. Unfortunately your insurance has denied it despite prior authorization. I wanted to touch base with you before I proceeded any further and see if the inhaler has provided you any benefit or if you are still using the inhaler. We can pursue other options and discuss more in a follow-up visit if you would like. Please contact our office via mychart or telephone [phone]) if you would like to continue inhalers.   Kind regards, Dr. Loanne Drilling

## 2022-06-10 ENCOUNTER — Other Ambulatory Visit (HOSPITAL_COMMUNITY): Payer: Self-pay

## 2022-06-18 ENCOUNTER — Other Ambulatory Visit: Payer: Self-pay

## 2022-06-18 ENCOUNTER — Other Ambulatory Visit: Payer: Self-pay | Admitting: Pharmacist

## 2022-06-18 ENCOUNTER — Ambulatory Visit (INDEPENDENT_AMBULATORY_CARE_PROVIDER_SITE_OTHER): Payer: 59 | Admitting: Internal Medicine

## 2022-06-18 ENCOUNTER — Encounter: Payer: Self-pay | Admitting: Pharmacist

## 2022-06-18 ENCOUNTER — Telehealth: Payer: Self-pay | Admitting: Pharmacist

## 2022-06-18 ENCOUNTER — Encounter: Payer: Self-pay | Admitting: Internal Medicine

## 2022-06-18 VITALS — BP 109/70 | HR 70 | Temp 97.9°F | Resp 16 | Ht 70.0 in | Wt 130.5 lb

## 2022-06-18 DIAGNOSIS — F17200 Nicotine dependence, unspecified, uncomplicated: Secondary | ICD-10-CM

## 2022-06-18 DIAGNOSIS — A310 Pulmonary mycobacterial infection: Secondary | ICD-10-CM | POA: Diagnosis not present

## 2022-06-18 MED ORDER — ARIKAYCE 590 MG/8.4ML IN SUSP: 590.0000 mg | Freq: Every day | RESPIRATORY_TRACT | 11 refills | Status: DC

## 2022-06-18 NOTE — Assessment & Plan Note (Signed)
Encouraged continued smoking cessation 

## 2022-06-18 NOTE — Patient Instructions (Signed)
Thank you for coming to see me today. It was a pleasure seeing you.  To Do: continue azithromycin 500mg  daily, ethambutol 1000mg  daily, and clofazimine 100mg  daily.   We will work to get inhaled amikacin started and have your picc line removed   If you have any questions or concerns, please do not hesitate to call the office at [phone].  Take Care,   Jule Ser, DO

## 2022-06-18 NOTE — Progress Notes (Signed)
[facility] Infectious Disease  CHIEF COMPLAINT:    Follow up for pulmonary MAC  SUBJECTIVE:    Gerald Fernandez is a 56 y.o. male with PMHx as below who presents to the clinic for pulmonary MAC.   Gerald Fernandez is here today for routine follow up.  He was able to see Dr Lorenda Cahill yesterday at [facility] whom made some recommendations as noted below.   Recommendations: 1) Stop intravenous amikacin. The organism MIC from the most recent susceptibility testing is very close to the resistant cutoff and I think the risk of continuing intravenous therapy outweighs the benefit.  2) Resume inhaled liposomal amikacin. Lung concentrations of this drug should be considerably higher than that achieved from intravenous therapy, and he does not have significant emphysema or other anatomic abnormalities that might lead to hypoventilation of target areas. 3) Continue oral azithromycin 500 mg daily 4) Continue oral ethambutol 1000 mg daily 5) Get susceptibility testing done for bedaquiline on a recent isolate; I would also investigate his out-of-pocket costs for bedaquiline. Pending those data, continue clofazimine 100 mg daily  6) Work on smoking cessation 7) Check azithromycin and ethambutol serum concentrations 2- and 6-hours post-dose to verify adequate absorption 8) Continue to collect sputum specimens for AFB every 2-3 months  9) Repeat CT scan around 09/2022   He overall is doing okay.  No fevers, chills. Continues to have some intermittent SOB and fatigue that is stable.  He is tolerating therapy.     Please see A&P for the details of today's visit and status of the patient's medical problems.   Patient's Medications  New Prescriptions   No medications on file  Previous Medications   ADVAIR HFA 115-21 MCG/ACT INHALER    INHALE 2 PUFFS INTO THE LUNGS TWICE A DAY   AMBULATORY NON FORMULARY MEDICATION    Take 100 mg by mouth daily. Medication Name: clofazimine   AMIKACIN (AMIKIN) IVPB     Inject into the vein.   AMIKACIN SULFATE LIPOSOME (ARIKAYCE) 590 MG/8.4ML SUSP    Inhale 590 mg into the lungs daily.   ATORVASTATIN (LIPITOR) 10 MG TABLET    Take 1 tablet (10 mg total) by mouth daily.   AZITHROMYCIN (ZITHROMAX) 500 MG TABLET    Take 1 tablet (500 mg total) by mouth daily.   BELBUCA 450 MCG FILM    Take 450 mcg by mouth 2 (two) times daily.   DRONABINOL (MARINOL) 5 MG CAPSULE    Take 5 mg by mouth daily.   ETHAMBUTOL (MYAMBUTOL) 100 MG TABLET    Take two 100 mg tablets WITH two 400 mg tablets of ethambutol together to equal 1000 mg total   ETHAMBUTOL (MYAMBUTOL) 400 MG TABLET    Take two 400 mg tablets WITH two 100 mg tablets of ethambutol together to equal 1000 mg total   HYDROCODONE-ACETAMINOPHEN (NORCO/VICODIN) 5-325 MG TABLET    Take 1 tablet by mouth 3 (three) times daily as needed.   IBUPROFEN (ADVIL) 800 MG TABLET    Take 800 mg by mouth in the morning and at bedtime.   MULTIPLE VITAMIN (MULTIVITAMIN WITH MINERALS) TABS TABLET    Take 1 tablet by mouth in the morning. Centrum Gummies   ONDANSETRON (ZOFRAN) 4 MG TABLET    Take 4 mg by mouth 2 (two) times daily as needed.   PREGABALIN (LYRICA) 100 MG CAPSULE    Take 100 mg by mouth at bedtime.  Modified Medications   No  medications on file  Discontinued Medications   OXYCODONE-ACETAMINOPHEN (PERCOCET) 10-325 MG TABLET    Take 1 tablet by mouth 4 (four) times daily as needed for pain.      Past Medical History:  Diagnosis Date   Allergy    Arthritis    Depression    situational (mother passing)   DVT (deep venous thrombosis) ([place]) 2010   GERD (gastroesophageal reflux disease)    not at this time   Hx of pulmonary embolus 2014   Impingement syndrome of left ankle    PONV (postoperative nausea and vomiting)    Smoker 12/02/2007   Tobacco use disorder     Social History   Tobacco Use   Smoking status: Every Day    Packs/day: 0.50    Years: 28.00    Total pack years: 14.00    Types: Cigarettes   Smokeless  tobacco: Former   Tobacco comments:    Pt reports cutting back to 8 cigarettes or less per day, bnm 10/08/21  Vaping Use   Vaping Use: Former  Substance Use Topics   Alcohol use: Yes    Alcohol/week: 2.0 standard drinks of alcohol    Types: 2 Standard drinks or equivalent per week    Comment: occasional/social   Drug use: No    Family History  Problem Relation Age of Onset   Hypertension Mother    COPD Mother    Aortic dissection Mother 74   Hypertension Father    Cancer Father     Allergies  Allergen Reactions   Penicillins Other (See Comments)    From childhood Did it involve swelling of the face/tongue/throat, SOB, or low BP? Yes Did it involve sudden or severe rash/hives, skin peeling, or any reaction on the inside of your mouth or nose? Unknown Did you need to seek medical attention at a hospital or doctor's office? Unknown When did it last happen? childhood reaction. If all above answers are "NO", may proceed with cephalosporin use.     Review of Systems  All other systems reviewed and are negative.    OBJECTIVE:    Vitals:   06/18/22 1119  BP: 109/70  Pulse: 70  Resp: 16  Temp: 97.9 F (36.6 C)  TempSrc: Oral  SpO2: 99%  Weight: 130 lb 8 oz (59.2 kg)  Height: _0  (1.778 m)   Body mass index is 18.72 kg/m.  Physical Exam Constitutional:      Appearance: Normal appearance.  HENT:     Head: Normocephalic and atraumatic.  Eyes:     Extraocular Movements: Extraocular movements intact.     Conjunctiva/sclera: Conjunctivae normal.  Pulmonary:     Effort: Pulmonary effort is normal. No respiratory distress.  Abdominal:     General: There is no distension.     Palpations: Abdomen is soft.  Skin:    General: Skin is warm and dry.  Neurological:     General: No focal deficit present.     Mental Status: He is alert and oriented to person, place, and time.  Psychiatric:        Mood and Affect: Mood normal.        Behavior: Behavior normal.       Labs and Microbiology:    Latest Ref Rng & Units 02/07/2022    9:42 AM 11/07/2021    9:36 AM 09/26/2021    4:02 PM  CBC  WBC 3.8 - 10.8 Thousand/uL 7.3  5.8  9.4   Hemoglobin 13.2 - 17.1  g/dL 14.8  14.8  14.6   Hematocrit 38.5 - 50.0 % 43.8  43.7  43.7   Platelets 140 - 400 Thousand/uL 215  199  228       Latest Ref Rng & Units 02/07/2022    9:42 AM 11/07/2021    9:36 AM 09/26/2021    4:02 PM  CMP  Glucose 65 - 99 mg/dL 83  86  87   BUN 7 - 25 mg/dL _0 Creatinine 0.70 - 1.30 mg/dL 0.76  0.75  0.77   Sodium 135 - 146 mmol/L 143  144  141   Potassium 3.5 - 5.3 mmol/L 4.8  4.1  4.1   Chloride 98 - 110 mmol/L 107  108  104   CO2 20 - 32 mmol/L 32  27  32   Calcium 8.6 - 10.3 mg/dL 9.3  9.4  9.4   Total Protein 6.1 - 8.1 g/dL 6.6  6.6  6.8   Total Bilirubin 0.2 - 1.2 mg/dL 0.5  0.4  0.3   AST 10 - 35 U/L _1 ALT 9 - 46 U/L _2 ASSESSMENT & PLAN:    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  Will stop his IV amikacin today and have PICC line removed.  Will work with pharmacy to resume inhaled liposomal amikacin.  For now, also continue azithromycin 578m PO daily, ethambutol 10067mdaily, and clofazimine 10063maily.    Will attempt to get bedaquiline sensitivities on most recent isolate as well as try to see about obtaining serum concentrations of azithromycin and ethambutol.  Check AFB sputum again today and continue doing so every 2-3 months.   Will plan for repeat CT in January 2024 with follow up at that time.    Current smoker Encouraged continued smoking cessation.   Orders Placed This Encounter  Procedures   MYCOBACTERIA, CULTURE, WITH FLUOROCHROME [facility] 06/18/2022, 11:48 AM  I have personally spent 40 minutes involved in face-to-face and  non-face-to-face activities for this patient on the day of the visit. Professional time spent includes the following activities: Preparing to see the patient (review of tests), Obtaining and/or reviewing separately obtained history (admission/discharge record), Performing a medically appropriate examination and/or evaluation , Ordering medications/tests/procedures, referring and communicating with other health care professionals, Documenting clinical information in the EMR, Independently interpreting results (not separately reported), Communicating results to the patient/family/caregiver, Counseling and educating the patient/family/caregiver and Care coordination (not separately reported).

## 2022-06-18 NOTE — Telephone Encounter (Signed)
Communicated message to [facility] to stop IV amikacin and have patient's PICC pulled today. Carola Rhine verified receipt.   Sent new Arikayce Rx to [employer] so he can restart. Will follow up to make sure he receives.   Alixis Harmon L. Cele Mote, PharmD [facility] Clinical Pharmacist Practitioner

## 2022-06-18 NOTE — Assessment & Plan Note (Signed)
Greatly appreciate Dr Romie Levee recommendations as noted during yesterday's visit.  Will stop his IV amikacin today and have PICC line removed.  Will work with pharmacy to resume inhaled liposomal amikacin.  For now, also continue azithromycin 500mg  PO daily, ethambutol 1000mg  daily, and clofazimine 100mg  daily.    Will attempt to get bedaquiline sensitivities on most recent isolate as well as try to see about obtaining serum concentrations of azithromycin and ethambutol.  Check AFB sputum again today and continue doing so every 2-3 months.   Will plan for repeat CT in January 2024 with follow up at that time.

## 2022-06-20 ENCOUNTER — Telehealth: Payer: Self-pay | Admitting: Pharmacist

## 2022-06-20 NOTE — Telephone Encounter (Signed)
2 bottles of clofazimine are located in the pharmacy office when patient needs a refill.   Dreamer Carillo L. Cathline Dowen, PharmD, BCIDP, AAHIVP, CPP Clinical Pharmacist Practitioner Infectious Diseases [place] 06/20/2022, 2:41 PM

## 2022-07-04 ENCOUNTER — Encounter: Payer: Self-pay | Admitting: Pharmacist

## 2022-07-15 LAB — M. AVIUM MIC PANEL
AMIKACIN (LIPOSOMAL, INHALED): 16 ug/mL
AMIKACIN: 16 ug/mL
CIPROFLOXACIN: >8 ug/mL
CLARITHROMYCIN: 4 ug/mL
CLOFAZIMINE: 0.12 ug/mL
DOXYCYCLINE: >8 ug/mL
LINEZOLID: >32 ug/mL
MINOCYCLINE: >8 ug/mL
MOXIFLOXACIN: 4 ug/mL
RIFABUTIN: 0.5 ug/mL
RIFAMPIN: >4 ug/mL
STREPTOMYCIN: 32 ug/mL

## 2022-07-15 LAB — MYCOBACTERIA,CULT W/FLUOROCHROME SMEAR
MICRO NUMBER:: 13755826
SMEAR:: NONE SEEN
SPECIMEN QUALITY:: ADEQUATE

## 2022-08-01 ENCOUNTER — Other Ambulatory Visit (HOSPITAL_COMMUNITY): Payer: Self-pay

## 2022-08-06 ENCOUNTER — Telehealth: Payer: Self-pay

## 2022-08-06 ENCOUNTER — Other Ambulatory Visit (HOSPITAL_COMMUNITY): Payer: Self-pay

## 2022-08-06 NOTE — Telephone Encounter (Signed)
[facility] Patient Advocate Encounter  Prior Authorization for [place] has been approved.    PA# [id number] Effective dates: 07/07/22 through 08/06/23   [facility] will continue to follow.  Clearance Coots, CPhT Specialty Pharmacy Patient [facility] for Infectious Disease Phone: [phone] Fax:  [phone]

## 2022-08-06 NOTE — Telephone Encounter (Signed)
[facility] Patient Advocate Encounter   Received notification from [employer] that prior authorization for Arikayce is required.   PA submitted on 08/06/2022 Key [id number] Status is pending    [facility] Clinic will continue to follow.   Clearance Coots, CPhT Specialty Pharmacy Patient [facility] for Infectious Disease Phone: [phone] Fax:  [phone]

## 2022-08-07 LAB — MYCOBACTERIA,CULT W/FLUOROCHROME SMEAR
MICRO NUMBER:: 14065464
SPECIMEN QUALITY:: ADEQUATE

## 2022-08-19 ENCOUNTER — Other Ambulatory Visit (HOSPITAL_COMMUNITY): Payer: Self-pay

## 2022-08-29 ENCOUNTER — Telehealth: Payer: Self-pay | Admitting: Pharmacist

## 2022-08-29 NOTE — Telephone Encounter (Signed)
Patient picked up 2 bottles of clofazimine on 08/28/22. Supply should last for ~3 months. Next refill due around end of March/beginning of April 2024.  Nataly Pacifico L. Jannette Fogo, PharmD, BCIDP, AAHIVP, CPP Clinical Pharmacist Practitioner Infectious Diseases Clinical Pharmacist [facility] 08/29/2022, 10:10 AM

## 2022-09-06 ENCOUNTER — Ambulatory Visit (HOSPITAL_BASED_OUTPATIENT_CLINIC_OR_DEPARTMENT_OTHER): Payer: 59 | Admitting: Pulmonary Disease

## 2022-09-18 ENCOUNTER — Other Ambulatory Visit: Payer: Self-pay

## 2022-09-18 ENCOUNTER — Encounter: Payer: Self-pay | Admitting: Internal Medicine

## 2022-09-18 ENCOUNTER — Ambulatory Visit (INDEPENDENT_AMBULATORY_CARE_PROVIDER_SITE_OTHER): Payer: 59 | Admitting: Internal Medicine

## 2022-09-18 VITALS — BP 115/73 | HR 69 | Temp 97.9°F | Wt 135.0 lb

## 2022-09-18 DIAGNOSIS — F17200 Nicotine dependence, unspecified, uncomplicated: Secondary | ICD-10-CM | POA: Diagnosis not present

## 2022-09-18 DIAGNOSIS — A310 Pulmonary mycobacterial infection: Secondary | ICD-10-CM

## 2022-09-18 MED ORDER — AZITHROMYCIN 500 MG PO TABS: 500.0000 mg | ORAL_TABLET | Freq: Every day | ORAL | 11 refills | Status: DC

## 2022-09-18 MED ORDER — ETHAMBUTOL HCL 100 MG PO TABS: ORAL_TABLET | ORAL | 11 refills | Status: DC

## 2022-09-18 MED ORDER — ETHAMBUTOL HCL 400 MG PO TABS: ORAL_TABLET | ORAL | 11 refills | Status: DC

## 2022-09-18 NOTE — Progress Notes (Signed)
[facility]  CHIEF COMPLAINT:    Follow up for pulmonary MAC  SUBJECTIVE:    Gerald Fernandez is a 57 y.o. male with PMHx as below who presents to the clinic for pulmonary MAC.   He was last seen on 06/18/22 and also saw Dr Lorenda Cahill at [facility] on 06/17/22.  He is currently on a regimen of Azithromycin 552m PO daily, Ethambutol 10042mPO daily, Clofazimine 10063mO daily, and inhaled Amikacin.  He has been out of his azithromycin and ethambutol for about 5 days.    His most recent AFB sputum cultures from 06/18/22 were again positive for MAC.  Unfortunately, susceptibility testing was not completed.  Regarding his symptoms, he reports overall improvement on a daily basis.  He is still smoking about 1 pack per week of cigarettes.  From Dr StoRomie Leveeior note, "I personally reviewed his chest CT films from 06/2021 and 03/2022 with Mr. LacGrosed his wife. Both the larger right upper lobe cavitary lesion and the smaller left upper lobe cavitary lesion have filled in and I can see some calcifications in the center of the right upper lobe lesion. I do not see any middle or lower lobe disease and he does not have a lot of surrounding emphysema. 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 Given that and ongoing tobacco use, I don't think that pursuing surgical resection is a good idea at this point. I would recommend trying to optimize his medical therapy, working on smoking cessation, and close monitoring. "  Please see A&P for the details of today's visit and status of the patient's medical problems.   Patient's Medications  New Prescriptions   No medications on file  Previous Medications   ADVAIR HFA 115-21 MCG/ACT INHALER    INHALE 2 PUFFS INTO THE LUNGS TWICE A DAY   AMBULATORY NON FORMULARY MEDICATION    Take 100 mg by mouth daily. Medication Name: clofazimine   AMIKACIN  SULFATE LIPOSOME (ARIKAYCE) 590 MG/8.4ML SUSP    Inhale 590 mg into the lungs daily.   ATORVASTATIN (LIPITOR) 10 MG TABLET    Take 1 tablet (10 mg total) by mouth daily.   BELBUCA 450 MCG FILM    Take 450 mcg by mouth 2 (two) times daily.   DRONABINOL (MARINOL) 5 MG CAPSULE    Take 5 mg by mouth daily.   HYDROCODONE-ACETAMINOPHEN (NORCO/VICODIN) 5-325 MG TABLET    Take 1 tablet by mouth 3 (three) times daily as needed.   IBUPROFEN (ADVIL) 800 MG TABLET    Take 800 mg by mouth in the morning and at bedtime.   MULTIPLE VITAMIN (MULTIVITAMIN WITH MINERALS) TABS TABLET    Take 1 tablet by mouth in the morning. Centrum Gummies   ONDANSETRON (ZOFRAN) 4 MG TABLET    Take 4 mg by mouth 2 (two) times daily as needed.   PREGABALIN (LYRICA) 100 MG CAPSULE    Take 100 mg by mouth at bedtime.  Modified Medications   Modified Medication Previous Medication   AZITHROMYCIN (ZITHROMAX) 500 MG TABLET azithromycin (ZITHROMAX) 500 MG tablet      Take 1 tablet (500 mg total) by mouth daily.    Take 1 tablet (500 mg total) by mouth daily.   ETHAMBUTOL (MYAMBUTOL) 100 MG TABLET ethambutol (MYAMBUTOL) 100 MG tablet      Take two 100 mg tablets WITH two  400 mg tablets of ethambutol together to equal 1000 mg total    Take two 100 mg tablets WITH two 400 mg tablets of ethambutol together to equal 1000 mg total   ETHAMBUTOL (MYAMBUTOL) 400 MG TABLET ethambutol (MYAMBUTOL) 400 MG tablet      Take two 400 mg tablets WITH two 100 mg tablets of ethambutol together to equal 1000 mg total    Take two 400 mg tablets WITH two 100 mg tablets of ethambutol together to equal 1000 mg total  Discontinued Medications   No medications on file      Past Medical History:  Diagnosis Date   Allergy    Arthritis    Depression    situational (mother passing)   DVT (deep venous thrombosis) ([place]) 2010   GERD (gastroesophageal reflux disease)    not at this time   Hx of pulmonary embolus 2014   Impingement syndrome of left ankle     PONV (postoperative nausea and vomiting)    Smoker 12/02/2007   Tobacco use disorder     Social History   Tobacco Use   Smoking status: Every Day    Packs/day: 0.50    Years: 28.00    Total pack years: 14.00    Types: Cigarettes   Smokeless tobacco: Former   Tobacco comments:    Pt reports cutting back to 8 cigarettes or less per day, bnm 10/08/21  Vaping Use   Vaping Use: Former  Substance Use Topics   Alcohol use: Yes    Alcohol/week: 2.0 standard drinks of alcohol    Types: 2 Standard drinks or equivalent per week    Comment: occasional/social   Drug use: No    Family History  Problem Relation Age of Onset   Hypertension Mother    COPD Mother    Aortic dissection Mother 56   Hypertension Father    Cancer Father     Allergies  Allergen Reactions   Penicillins Other (See Comments)    From childhood Did it involve swelling of the face/tongue/throat, SOB, or low BP? Yes Did it involve sudden or severe rash/hives, skin peeling, or any reaction on the inside of your mouth or nose? Unknown Did you need to seek medical attention at a hospital or doctor's office? Unknown When did it last happen? childhood reaction. If all above answers are "NO", may proceed with cephalosporin use.     Review of Systems  All other systems reviewed and are negative.    OBJECTIVE:    Vitals:   09/18/22 1103  BP: 115/73  Pulse: 69  Temp: 97.9 F (36.6 C)  TempSrc: Temporal  SpO2: 99%  Weight: 135 lb (61.2 kg)   Body mass index is 19.37 kg/m.  Physical Exam Constitutional:      Appearance: Normal appearance.  Eyes:     Extraocular Movements: Extraocular movements intact.     Conjunctiva/sclera: Conjunctivae normal.  Pulmonary:     Effort: Pulmonary effort is normal. No respiratory distress.  Musculoskeletal:        General: Normal range of motion.  Skin:    General: Skin is warm and dry.  Neurological:     General: No focal deficit present.     Mental Status: He  is alert and oriented to person, place, and time.  Psychiatric:        Mood and Affect: Mood normal.        Behavior: Behavior normal.      Labs and Microbiology:  Latest Ref Rng & Units 02/07/2022    9:42 AM 11/07/2021    9:36 AM 09/26/2021    4:02 PM  CBC  WBC 3.8 - 10.8 Thousand/uL 7.3  5.8  9.4   Hemoglobin 13.2 - 17.1 g/dL 14.8  14.8  14.6   Hematocrit 38.5 - 50.0 % 43.8  43.7  43.7   Platelets 140 - 400 Thousand/uL 215  199  228       Latest Ref Rng & Units 02/07/2022    9:42 AM 11/07/2021    9:36 AM 09/26/2021    4:02 PM  CMP  Glucose 65 - 99 mg/dL 83  86  87   BUN 7 - 25 mg/dL _0 Creatinine 0.70 - 1.30 mg/dL 0.76  0.75  0.77   Sodium 135 - 146 mmol/L 143  144  141   Potassium 3.5 - 5.3 mmol/L 4.8  4.1  4.1   Chloride 98 - 110 mmol/L 107  108  104   CO2 20 - 32 mmol/L 32  27  32   Calcium 8.6 - 10.3 mg/dL 9.3  9.4  9.4   Total Protein 6.1 - 8.1 g/dL 6.6  6.6  6.8   Total Bilirubin 0.2 - 1.2 mg/dL 0.5  0.4  0.3   AST 10 - 35 U/L _1 ALT 9 - 46 U/L _2 ASSESSMENT & PLAN:    Current smoker Discussed his tobacco use and importance of smoking cessation to improve his chances of curing MAC infection.  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  His most recent cultures in October 2023 remain smear and culture positive.  Unfortunately, susceptibility testing was not done on this isolate.  Will continue with current regimen of Azithromycin, Ethambutol, Clofazimine and inhaled amikacin.  Repeat sputum AFB cultures today and will attempt Bedaquiline sensitivities if these are positive.  Will also get repeat CT chest at this time and follow up in 3 months.   Also advised pulmonary follow up (has appt 09/19/22) up for inhaler management and any  recommendations for airway clearance techniques.   Orders Placed This Encounter  Procedures   MYCOBACTERIA, CULTURE, WITH FLUOROCHROME SMEAR   CT CHEST WO CONTRAST    Standing Status:   Future    Standing Expiration Date:   09/19/2023    Order Specific Question:   Preferred imaging location?    Answer:   [facility]   CBC   COMPLETE METABOLIC PANEL WITH GFR          [facility] 09/18/2022, 11:28 AM   I have personally spent 30 minutes involved in face-to-face and non-face-to-face activities for this patient on the day of the visit. Professional time spent includes the following activities: Preparing to see the patient (review of tests), Obtaining and/or reviewing separately obtained history (admission/discharge record), Performing a medically appropriate examination and/or evaluation , Ordering medications/tests/procedures, referring and communicating with other health care professionals, Documenting clinical information in the EMR, Independently interpreting results (not separately reported), Communicating results to the patient/family/caregiver, Counseling and educating the patient/family/caregiver and Care coordination (not separately reported).

## 2022-09-18 NOTE — Assessment & Plan Note (Signed)
Discussed his tobacco use and importance of smoking cessation to improve his chances of curing MAC infection.

## 2022-09-18 NOTE — Assessment & Plan Note (Signed)
Patient with nodular/cavitary lesions currently on therapy since approximately December 2022 with persistently positive cultures, but with some radiographic and symptoms improvement.  His most recent cultures in October 2023 remain smear and culture positive.  Unfortunately, susceptibility testing was not done on this isolate.  Will continue with current regimen of Azithromycin, Ethambutol, Clofazimine and inhaled amikacin.  Repeat sputum AFB cultures today and will attempt Bedaquiline sensitivities if these are positive.  Will also get repeat CT chest at this time and follow up in 3 months.   Also advised pulmonary follow up (has appt 09/19/22) up for inhaler management and any recommendations for airway clearance techniques.

## 2022-09-18 NOTE — Addendum Note (Signed)
Addended by: Caffie Pinto on: 09/18/2022 11:37 AM   Modules accepted: Orders

## 2022-09-19 ENCOUNTER — Encounter (HOSPITAL_BASED_OUTPATIENT_CLINIC_OR_DEPARTMENT_OTHER): Payer: Self-pay | Admitting: Pulmonary Disease

## 2022-09-19 ENCOUNTER — Ambulatory Visit (INDEPENDENT_AMBULATORY_CARE_PROVIDER_SITE_OTHER): Payer: 59 | Admitting: Pulmonary Disease

## 2022-09-19 VITALS — BP 118/60 | HR 74 | Ht 70.0 in | Wt 145.0 lb

## 2022-09-19 DIAGNOSIS — A310 Pulmonary mycobacterial infection: Secondary | ICD-10-CM

## 2022-09-19 LAB — CBC
HCT: 40.7 % (ref 38.5–50.0)
Hemoglobin: 14 g/dL (ref 13.2–17.1)
MCH: 32.8 pg (ref 27.0–33.0)
MCHC: 34.4 g/dL (ref 32.0–36.0)
MCV: 95.3 fL (ref 80.0–100.0)
MPV: 10.8 fL (ref 7.5–12.5)
Platelets: 212 10*3/uL (ref 140–400)
RBC: 4.27 10*6/uL (ref 4.20–5.80)
RDW: 12.1 % (ref 11.0–15.0)
WBC: 7.8 10*3/uL (ref 3.8–10.8)

## 2022-09-19 LAB — COMPLETE METABOLIC PANEL WITH GFR
AG Ratio: 1.7 (calc) (ref 1.0–2.5)
ALT: 13 U/L (ref 9–46)
AST: 19 U/L (ref 10–35)
Albumin: 3.8 g/dL (ref 3.6–5.1)
Alkaline phosphatase (APISO): 39 U/L (ref 35–144)
BUN: 10 mg/dL (ref 7–25)
CO2: 31 mmol/L (ref 20–32)
Calcium: 8.2 mg/dL — ABNORMAL LOW (ref 8.6–10.3)
Chloride: 105 mmol/L (ref 98–110)
Creat: 0.73 mg/dL (ref 0.70–1.30)
Globulin: 2.2 g/dL (calc) (ref 1.9–3.7)
Glucose, Bld: 88 mg/dL (ref 65–99)
Potassium: 3.7 mmol/L (ref 3.5–5.3)
Sodium: 144 mmol/L (ref 135–146)
Total Bilirubin: 0.5 mg/dL (ref 0.2–1.2)
Total Protein: 6 g/dL — ABNORMAL LOW (ref 6.1–8.1)
eGFR: 108 mL/min/{1.73_m2} (ref >=60)

## 2022-09-19 MED ORDER — ALBUTEROL SULFATE HFA 108 (90 BASE) MCG/ACT IN AERS: 2.0000 | INHALATION_SPRAY | Freq: Four times a day (QID) | RESPIRATORY_TRACT | 3 refills | Status: DC | PRN
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
## 2022-09-19 NOTE — Patient Instructions (Signed)
Pulmonary MAC Chronic cough - resolved --CT Chest ordered by ID --Followed by ID, currently on therapy with sputum monitoring --[facility] following. If symptoms worsen, will consider more aggressive treatment  Follow-up with me in 1 year

## 2022-09-27 ENCOUNTER — Ambulatory Visit (HOSPITAL_COMMUNITY)
Admission: RE | Admit: 2022-09-27 | Discharge: 2022-09-27 | Disposition: A | Discharge status: Home / Self Care | Payer: 59 | Source: Ambulatory Visit | Attending: Internal Medicine | Admitting: Internal Medicine

## 2022-09-27 DIAGNOSIS — A310 Pulmonary mycobacterial infection: Secondary | ICD-10-CM

## 2022-10-03 IMAGING — CT CT CHEST W/O CM
3 of 8 series · 15 of 36 positions shown, 18 images · non-contrast
Comparison: 07/02/2021

CLINICAL DATA: Follow-up lung nodules and cavitating lesions.
Active smoker. Previous PE.

EXAM:
CT CHEST WITHOUT CONTRAST
TECHNIQUE: Multidetector CT imaging of the chest was performed following the
standard protocol without IV contrast.

[Series 2: thorax · axial · 0.65mm/px · z∈[+1299,+1555]mm · 9 of 161 slices shown, 12 images]
[im 17/161  mediastinal]
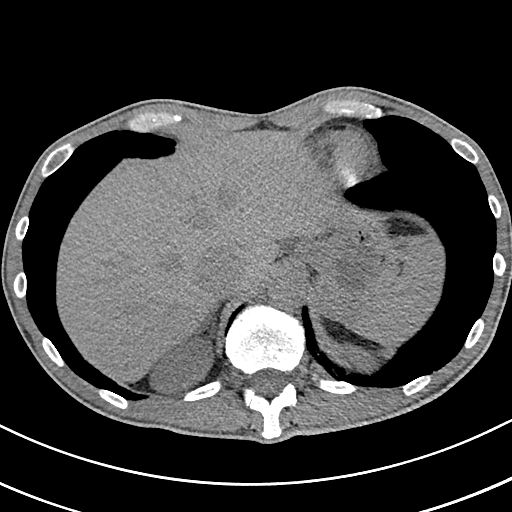
[im 17/161  lung]
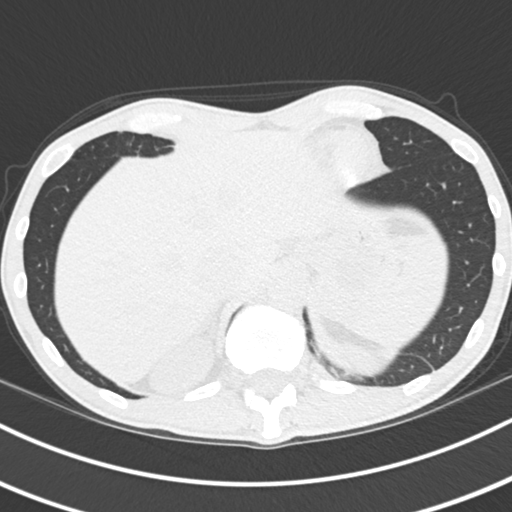
[im 33/161  lung]
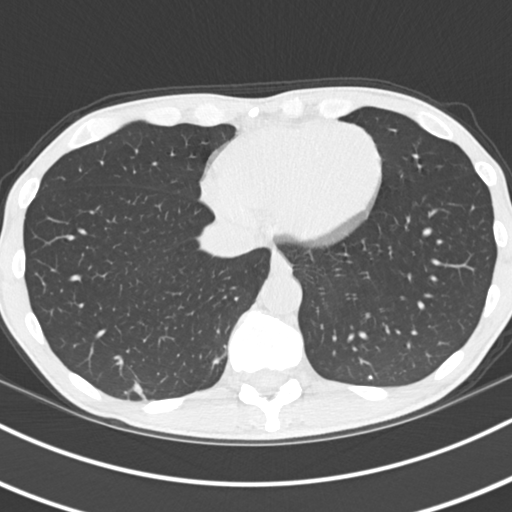
[im 49/161  lung]
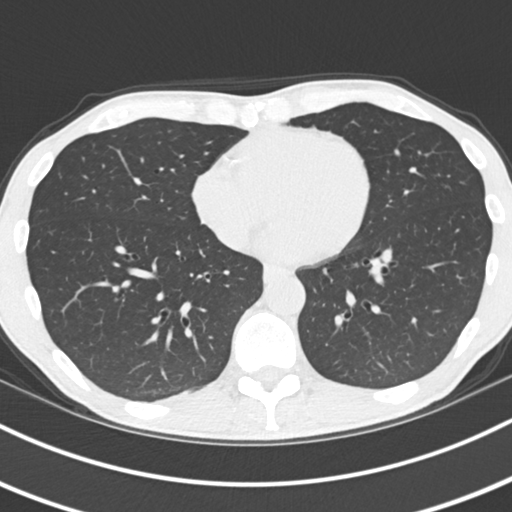
[im 65/161  lung]
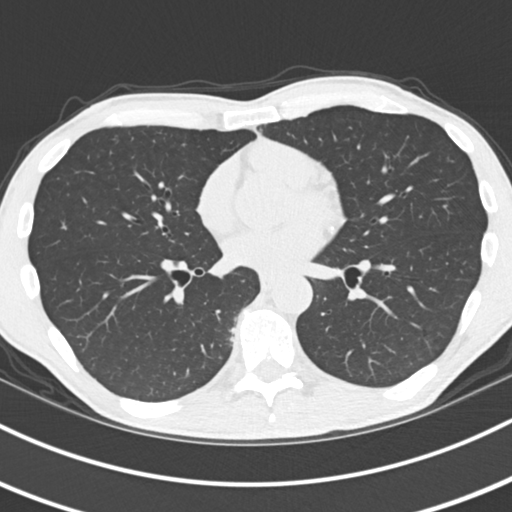
[im 81/161  mediastinal]
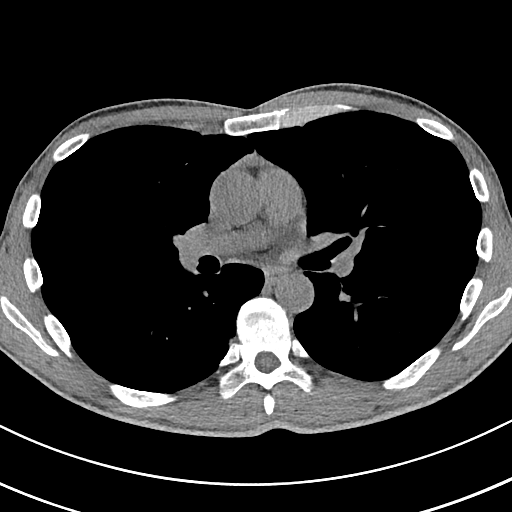
[im 81/161  lung]
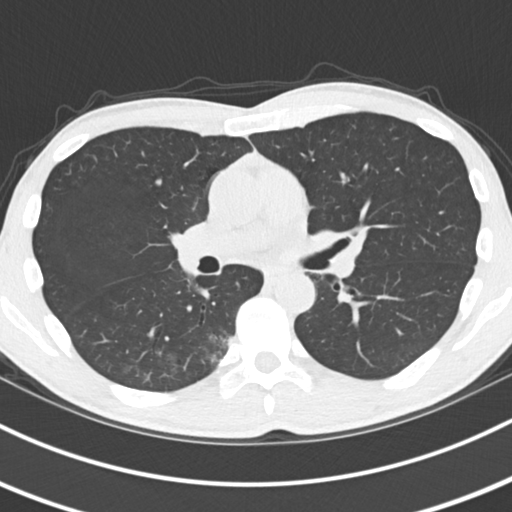
[im 97/161  lung]
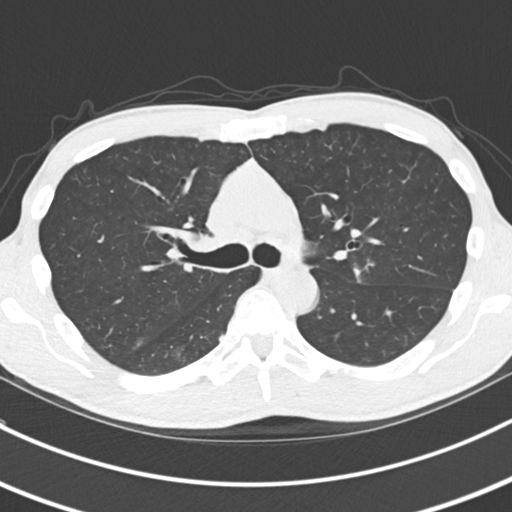
[im 113/161  lung]
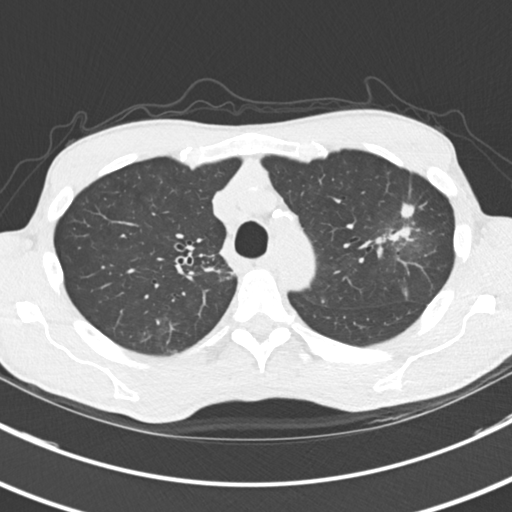
[im 129/161  lung]
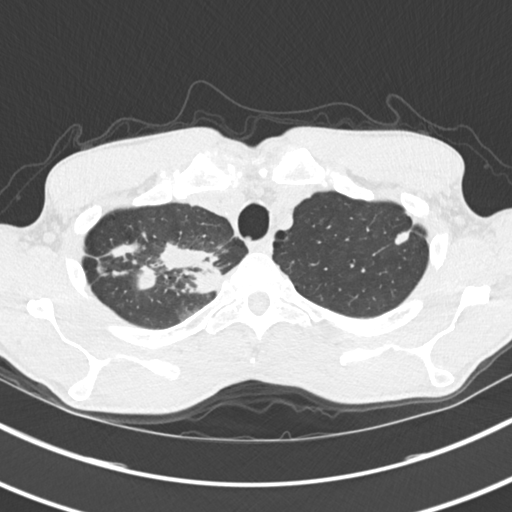
[im 145/161  mediastinal]
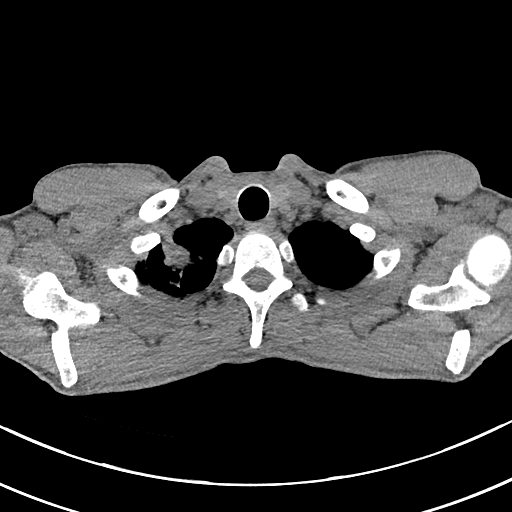
[im 145/161  lung]
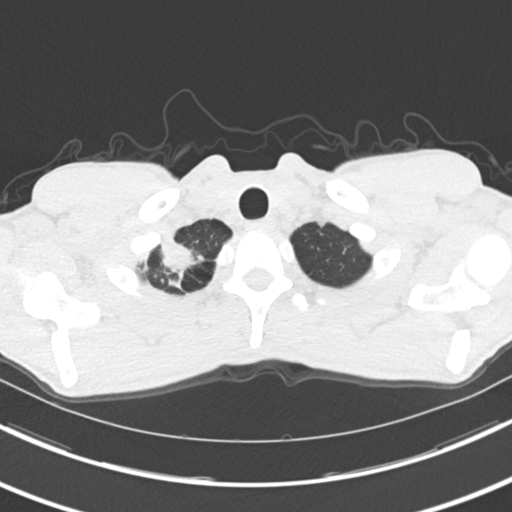

[Series 3: lungs · axial · 0.65mm/px · z∈[+1299,+1459]mm · 5 of 161 slices shown]
[im 17/161  lung]
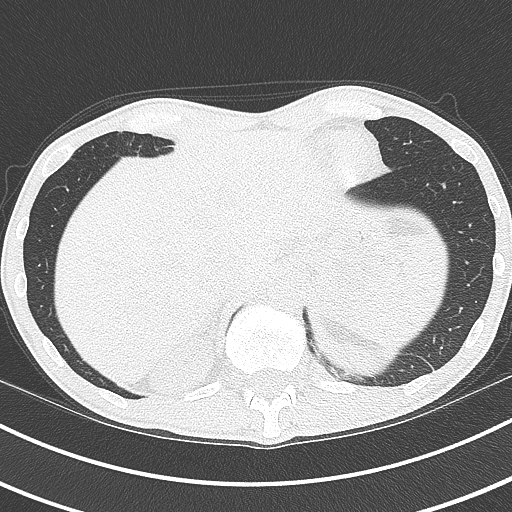
[im 33/161  lung]
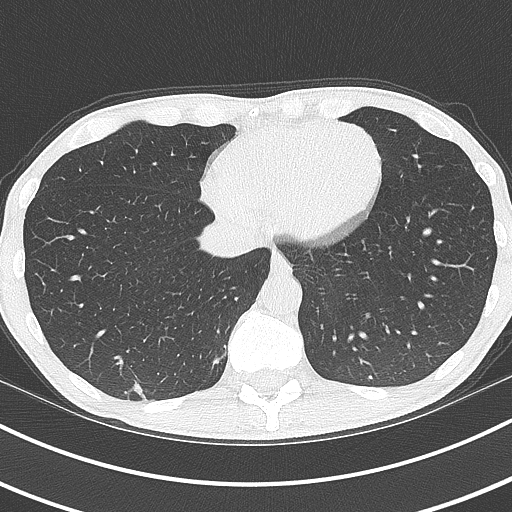
[im 49/161  lung]
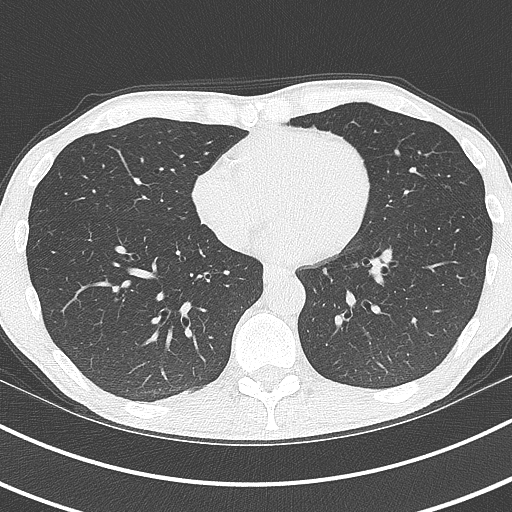
[im 65/161  lung]
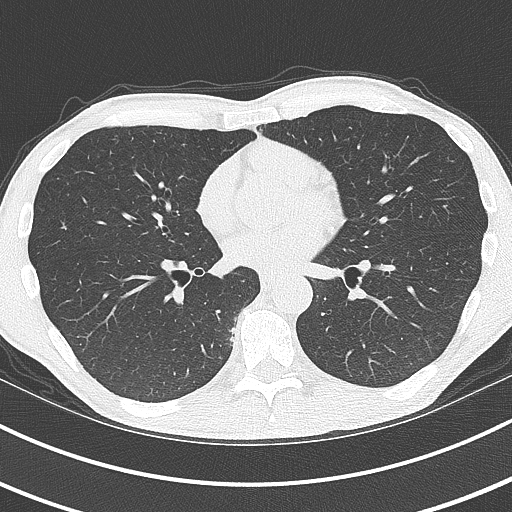
[im 97/161  lung]
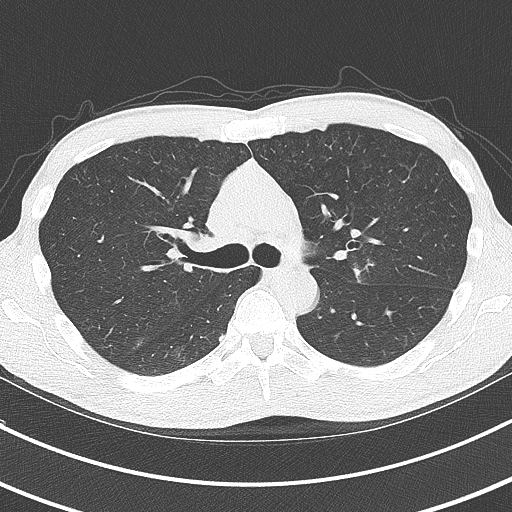

[Series 5: coronal · coronal · 0.65mm/px · 1 of 121 slices shown]
[im 61/121  lung]
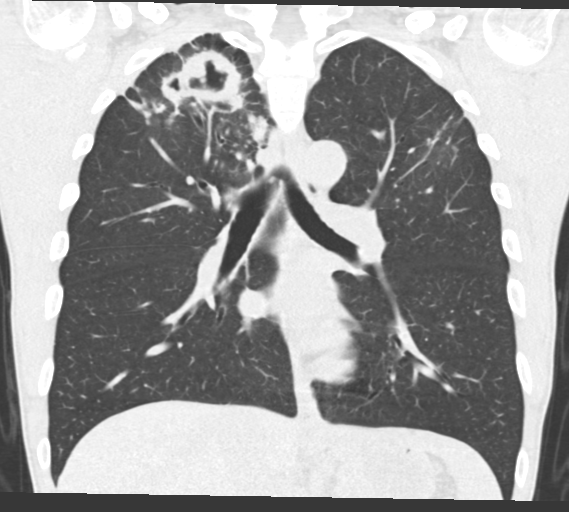

[15 of 36 positions shown; findings below may reference images not displayed]

FINDINGS: Cardiovascular: Heart is normal sized calcified plaque present over
the 3 vessel coronary arteries. Thoracic aorta is normal caliber
measuring 3.4 cm in AP diameter unchanged. Remaining vascular
structures are unremarkable.

Mediastinum/Nodes: No mediastinal or hilar adenopathy. Remaining
mediastinal structures are unremarkable.

Lungs/Pleura: Lungs are adequately inflated. There is continued
upper lobe cavitary disease with largest thick wall cavitary lesion
unchanged over the right apex measuring 2.8 x 4.8 cm (previously
x 4.9 cm). There are adjacent small nodular foci of opacification in
the right upper lobe/apex unchanged. There is mild new/worsening
nodule opacification abutting the pleural surface of the medial
right upper lobe with somewhat linear morphology on the sagittal
images possibly representing atelectasis. Stable mild nodularity
adjacent the cavitary lesion in the left upper lobe. Cavitary lesion
over the left upper lobe is stable measuring 1.3 x 2.1 cm
(previously 1.7 x 2.3 cm). Stable 8-9 mm nodule over the left upper
lobe. New cluster of subcentimeter nodules over the posterior left
upper lobe. Subtle patchy ground-glass opacification over the
posterior right upper lobe and superior segment right lower lobe
which is new. Mild focal opacification over the medial aspect right
lower lobe. Minimal scarring/atelectasis over the posterior right
lower lobe unchanged. Airways are normal.

Upper Abdomen: No acute findings. Minimal calcified plaque over the
abdominal aorta.

Musculoskeletal: Degenerative change of the spine. No focal
abnormality.
IMPRESSION: 1. Stable bilateral upper lobe cavitary lesions and associated
nodules with the largest thick wall cavitary lesion over the right
apex measuring 2.8 x 4.8 cm (previously 3.0 x 4.9 cm). New small
focus of airspace opacification over the medial right lower lobe.
New linear opacification abutting the pleura over the right upper
lobe possibly atelectasis. New cluster of subcentimeter nodules over
the posterior left upper lobe as well as new mild patchy
ground-glass opacification over the right upper and lower lobes as
described. Findings may be due to an infectious versus noninfectious
granulomatous process, other infectious/fungal disease, inflammatory
disease or possibly malignancy. Recommend clinical correlation and
follow-up as indicated.
2. Aortic atherosclerosis. Atherosclerotic coronary artery disease.
3. Stable ectasia of the ascending thoracic aorta measuring 3.4 cm
in AP diameter. Recommend semi-annual imaging followup by CTA or MRA
and referral to cardiothoracic surgery if not already obtained.

Aortic Atherosclerosis ([id number]-XYB.B).

## 2022-11-04 LAB — MYCOBACTERIA,CULT W/FLUOROCHROME SMEAR
MICRO NUMBER:: 14442260
SMEAR:: NONE SEEN
SPECIMEN QUALITY:: ADEQUATE

## 2022-12-11 ENCOUNTER — Other Ambulatory Visit: Payer: Self-pay

## 2022-12-11 ENCOUNTER — Ambulatory Visit: Payer: 59 | Admitting: Internal Medicine

## 2022-12-11 ENCOUNTER — Ambulatory Visit (INDEPENDENT_AMBULATORY_CARE_PROVIDER_SITE_OTHER): Payer: Managed Care, Other (non HMO) | Admitting: Internal Medicine

## 2022-12-11 ENCOUNTER — Encounter: Payer: Self-pay | Admitting: Internal Medicine

## 2022-12-11 VITALS — BP 116/76 | HR 71 | Temp 98.4°F | Ht 70.0 in | Wt 132.0 lb

## 2022-12-11 DIAGNOSIS — F17200 Nicotine dependence, unspecified, uncomplicated: Secondary | ICD-10-CM | POA: Diagnosis not present

## 2022-12-11 DIAGNOSIS — A310 Pulmonary mycobacterial infection: Secondary | ICD-10-CM

## 2022-12-11 NOTE — Assessment & Plan Note (Signed)
Discussed ongoing efforts at tobacco cessation and how this will help with clearance of MAC infection.

## 2022-12-11 NOTE — Assessment & Plan Note (Signed)
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  His cultures had been persistently positive from December 2022 until most recently negative finally in January 2024.  He is currently on regimen of Azithromycin, Ethambutol, Clofazimine and inhaled amikacin which we'll continue for now.  Check safety labs today and repeat AFB sputum cultures.  His most recent chest CT in January 2024 also showed some stability and improvement.  He'll also continue airway clearance techniques as recommended by pulmonary.  Follow up in 3 months.

## 2022-12-11 NOTE — Progress Notes (Signed)
[facility]  CHIEF COMPLAINT:    Follow up for MAC infection  SUBJECTIVE:    Gerald Fernandez is a 57 y.o. male with PMHx as below who presents to the clinic for MAC infection.   He was last seen on 09/18/22 and also saw Dr Valentina Lucks at [facility] on 06/17/22.  He is currently on a regimen of Azithromycin 500mg  PO daily, Ethambutol 1000mg  PO daily, Clofazimine 100mg  PO daily, and inhaled Amikacin.     Gerald most recent AFB sputum cultures from 09/18/22 were finally negative after persistently positive cultures from time of initial diagnosis in December 2022.  Regarding Gerald symptoms, he reports he is doing well today and no major complaints or symptoms.  He reports he continues to have cut back on Gerald tobacco use.  He is tolerating antibiotics with no issues.  He had a repeat CT chest in January 2024 that overall showed stability with some areas of improvement.    From Dr Maretta Los note, "I personally reviewed Gerald chest CT films from 06/2021 and 03/2022 with Gerald Fernandez and Gerald Fernandez. Both the larger right upper lobe cavitary lesion and the smaller left upper lobe cavitary lesion have filled in and I can see some calcifications in the center of the right upper lobe lesion. I do not see any middle or lower lobe disease and he does not have a lot of surrounding emphysema. 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 Given that and ongoing tobacco use, I don't think that pursuing surgical resection is a good idea at this point. I would recommend trying to optimize Gerald medical therapy, working on smoking cessation, and close monitoring. "  Please see A&P for the details of today's visit and status of the patient's medical problems.   Patient's Medications  New Prescriptions   No medications on file  Previous Medications   ADVAIR HFA 115-21 MCG/ACT INHALER    INHALE 2 PUFFS INTO THE LUNGS  TWICE A DAY   ALBUTEROL (VENTOLIN HFA) 108 (90 BASE) MCG/ACT INHALER    Inhale 2 puffs into the lungs every 6 (six) hours as needed for wheezing or shortness of breath.   AMIKACIN SULFATE LIPOSOME (ARIKAYCE) 590 MG/8.4ML SUSP    Inhale 590 mg into the lungs daily.   ATORVASTATIN (LIPITOR) 10 MG TABLET    Take 1 tablet (10 mg total) by mouth daily.   AZITHROMYCIN (ZITHROMAX) 500 MG TABLET    Take 1 tablet (500 mg total) by mouth daily.   BELBUCA 450 MCG FILM    Take 450 mcg by mouth 2 (two) times daily.   CLOFAZIMINE 50 MG CAPS CAPSULE (FOR COMPASSIONATE USE)    Take 100 mg by mouth daily with breakfast.   DRONABINOL (MARINOL) 5 MG CAPSULE    Take 5 mg by mouth daily.   ETHAMBUTOL (MYAMBUTOL) 100 MG TABLET    Take two 100 mg tablets WITH two 400 mg tablets of ethambutol together to equal 1000 mg total   ETHAMBUTOL (MYAMBUTOL) 400 MG TABLET    Take two 400 mg tablets WITH two 100 mg tablets of ethambutol together to equal 1000 mg total   HYDROCODONE-ACETAMINOPHEN (NORCO/VICODIN) 5-325 MG TABLET    Take 1 tablet by mouth 3 (three) times daily as needed.   IBUPROFEN (ADVIL) 800 MG TABLET    Take 800 mg by mouth in the morning and  at bedtime.   MULTIPLE VITAMIN (MULTIVITAMIN WITH MINERALS) TABS TABLET    Take 1 tablet by mouth in the morning. Centrum Gummies   ONDANSETRON (ZOFRAN) 4 MG TABLET    Take 4 mg by mouth 2 (two) times daily as needed.   PREGABALIN (LYRICA) 100 MG CAPSULE    Take 100 mg by mouth at bedtime.  Modified Medications   No medications on file  Discontinued Medications   AMBULATORY NON FORMULARY MEDICATION    Take 100 mg by mouth daily. Medication Name: clofazimine      Past Medical History:  Diagnosis Date   Allergy    Arthritis    Depression    situational (mother passing)   DVT (deep venous thrombosis) 2010   GERD (gastroesophageal reflux disease)    not at this time   Hx of pulmonary embolus 2014   Impingement syndrome of left ankle    PONV (postoperative nausea and  vomiting)    Smoker 12/02/2007   Tobacco use disorder     Social History   Tobacco Use   Smoking status: Some Days    Packs/day: 0.20    Years: 28.00    Additional pack years: 0.00    Total pack years: 5.60    Types: Cigarettes   Smokeless tobacco: Former   Tobacco comments:    Pt reports cutting back to 8 cigarettes or less per day, bnm 10/08/21  Vaping Use   Vaping Use: Former  Substance Use Topics   Alcohol use: Yes    Alcohol/week: 2.0 standard drinks of alcohol    Types: 2 Standard drinks or equivalent per week    Comment: occasional/social   Drug use: No    Family History  Problem Relation Age of Onset   Hypertension Mother    COPD Mother    Aortic dissection Mother 90   Hypertension Father    Cancer Father     Allergies  Allergen Reactions   Penicillins Other (See Comments)    From childhood Did it involve swelling of the face/tongue/throat, SOB, or low BP? Yes Did it involve sudden or severe rash/hives, skin peeling, or any reaction on the inside of your mouth or nose? Unknown Did you need to seek medical attention at a hospital or doctor's office? Unknown When did it last happen? childhood reaction. If all above answers are "NO", may proceed with cephalosporin use.     Review of Systems  All other systems reviewed and are negative.  Except as noted above.  OBJECTIVE:    Vitals:   12/11/22 1602  BP: 116/76  Pulse: 71  Temp: 98.4 F (36.9 C)  TempSrc: Temporal  SpO2: 96%  Weight: 132 lb (59.9 kg)  Height: 5\' 10"  (1.778 m)   Body mass index is 18.94 kg/m.  Physical Exam Constitutional:      Appearance: Normal appearance.  HENT:     Head: Normocephalic and atraumatic.  Eyes:     Extraocular Movements: Extraocular movements intact.     Conjunctiva/sclera: Conjunctivae normal.  Abdominal:     General: There is no distension.     Palpations: Abdomen is soft.  Musculoskeletal:     Cervical back: Normal range of motion and neck supple.   Skin:    General: Skin is warm and dry.     Coloration: Skin is not jaundiced.  Neurological:     General: No focal deficit present.     Mental Status: He is alert and oriented to person, place, and time.  Psychiatric:        Mood and Affect: Mood normal.        Behavior: Behavior normal.      Labs and Microbiology:    Latest Ref Rng & Units 09/18/2022   11:33 AM 02/07/2022    9:42 AM 11/07/2021    9:36 AM  CBC  WBC 3.8 - 10.8 Thousand/uL 7.8  7.3  5.8   Hemoglobin 13.2 - 17.1 g/dL 24.414.0  01.014.8  27.214.8   Hematocrit 38.5 - 50.0 % 40.7  43.8  43.7   Platelets 140 - 400 Thousand/uL 212  215  199       Latest Ref Rng & Units 09/18/2022   11:33 AM 02/07/2022    9:42 AM 11/07/2021    9:36 AM  CMP  Glucose 65 - 99 mg/dL 88  83  86   BUN 7 - 25 mg/dL 10  10  7    Creatinine 0.70 - 1.30 mg/dL 5.360.73  6.440.76  0.340.75   Sodium 135 - 146 mmol/L 144  143  144   Potassium 3.5 - 5.3 mmol/L 3.7  4.8  4.1   Chloride 98 - 110 mmol/L 105  107  108   CO2 20 - 32 mmol/L 31  32  27   Calcium 8.6 - 10.3 mg/dL 8.2  9.3  9.4   Total Protein 6.1 - 8.1 g/dL 6.0  6.6  6.6   Total Bilirubin 0.2 - 1.2 mg/dL 0.5  0.5  0.4   AST 10 - 35 U/L 19  20  23    ALT 9 - 46 U/L 13  17  19        ASSESSMENT & PLAN:    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  Gerald cultures had been persistently positive from December 2022 until most recently negative finally in January 2024.  He is currently on regimen of Azithromycin, Ethambutol, Clofazimine and inhaled amikacin which we'll continue for now.  Check safety labs today and repeat AFB sputum cultures.  Gerald most recent chest CT in January 2024 also showed some stability and improvement.  He'll also continue airway clearance techniques as recommended by pulmonary.  Follow up in 3 months.   Current smoker Discussed ongoing efforts at tobacco cessation and how  this will help with clearance of MAC infection.    Orders Placed This Encounter  Procedures   MYCOBACTERIA, CULTURE, WITH FLUOROCHROME SMEAR   CBC   Comp Met (CMET)      Vedia CofferAndrew N Siera [facility] 12/11/2022, 4:20 PM

## 2022-12-12 LAB — COMPREHENSIVE METABOLIC PANEL
AG Ratio: 1.7 (calc) (ref 1.0–2.5)
ALT: 19 U/L (ref 9–46)
AST: 26 U/L (ref 10–35)
Albumin: 4.1 g/dL (ref 3.6–5.1)
Alkaline phosphatase (APISO): 40 U/L (ref 35–144)
BUN: 14 mg/dL (ref 7–25)
CO2: 29 mmol/L (ref 20–32)
Calcium: 8.6 mg/dL (ref 8.6–10.3)
Chloride: 103 mmol/L (ref 98–110)
Creat: 0.83 mg/dL (ref 0.70–1.30)
Globulin: 2.4 g/dL (calc) (ref 1.9–3.7)
Glucose, Bld: 74 mg/dL (ref 65–99)
Potassium: 4 mmol/L (ref 3.5–5.3)
Sodium: 140 mmol/L (ref 135–146)
Total Bilirubin: 0.6 mg/dL (ref 0.2–1.2)
Total Protein: 6.5 g/dL (ref 6.1–8.1)

## 2022-12-12 LAB — CBC
HCT: 43.4 % (ref 38.5–50.0)
Hemoglobin: 14.7 g/dL (ref 13.2–17.1)
MCH: 32.3 pg (ref 27.0–33.0)
MCHC: 33.9 g/dL (ref 32.0–36.0)
MCV: 95.4 fL (ref 80.0–100.0)
MPV: 10.8 fL (ref 7.5–12.5)
Platelets: 215 10*3/uL (ref 140–400)
RBC: 4.55 10*6/uL (ref 4.20–5.80)
RDW: 12.1 % (ref 11.0–15.0)
WBC: 7.7 10*3/uL (ref 3.8–10.8)

## 2022-12-16 ENCOUNTER — Telehealth: Payer: Self-pay | Admitting: Pharmacist

## 2022-12-16 DIAGNOSIS — A310 Pulmonary mycobacterial infection: Secondary | ICD-10-CM

## 2022-12-16 MED ORDER — CLOFAZIMINE 50 MG PO CAPS - FOR COMPASSIONATE USE: 100.0000 mg | ORAL_CAPSULE | Freq: Every day | ORAL | 1 refills | Status: DC

## 2022-12-16 NOTE — Telephone Encounter (Signed)
Patient picked up 2 bottles of clofazimine on 12/11/22. Supply should last for ~3 months. Next refill due around the middle/end of July.  Raevin Wierenga L. Darriona Dehaas, PharmD, BCIDP, AAHIVP, CPP Clinical Pharmacist Practitioner Infectious Diseases Clinical Pharmacist [facility] 12/16/2022, 3:31 PM

## 2022-12-31 ENCOUNTER — Telehealth: Payer: Self-pay | Admitting: *Deleted

## 2022-12-31 NOTE — Telephone Encounter (Signed)
I called the afternoon appt to see if she would want the appt and would be willing to fast and have the labs done at that time. Then I will send to Bre to reschedule Mr. Hickel if she is willing.

## 2022-12-31 NOTE — Telephone Encounter (Signed)
Nope.  Looks like he was a Dr. Susann Givens pt, not seen for 3 years. Appt was made by [facility] on 4/22. I was not asked about this (and would have said no).  Not sure why JCL isn't still listed as a PCP, that would be the question as to whether or not he should be scheduled back with him.  I guess we had an open slot--think about who else you'd like to put in there (morning CPE!!), and/or see if the afternoon one (who stated she wouldn't fast) would prefer to come in the morning.

## 2022-12-31 NOTE — Telephone Encounter (Signed)
This patient is on your schedule for a CPE 01/08/23. Did you ok this?

## 2023-01-08 ENCOUNTER — Encounter: Payer: 59 | Admitting: Family Medicine

## 2023-01-13 ENCOUNTER — Encounter: Payer: Self-pay | Admitting: Family Medicine

## 2023-01-13 ENCOUNTER — Ambulatory Visit (INDEPENDENT_AMBULATORY_CARE_PROVIDER_SITE_OTHER): Payer: 59 | Admitting: Family Medicine

## 2023-01-13 VITALS — BP 128/62 | HR 81 | Temp 97.5°F | Resp 16 | Ht 69.25 in | Wt 133.0 lb

## 2023-01-13 DIAGNOSIS — F17200 Nicotine dependence, unspecified, uncomplicated: Secondary | ICD-10-CM | POA: Diagnosis not present

## 2023-01-13 DIAGNOSIS — Z0000 Encounter for general adult medical examination without abnormal findings: Secondary | ICD-10-CM | POA: Diagnosis not present

## 2023-01-13 DIAGNOSIS — F4321 Adjustment disorder with depressed mood: Secondary | ICD-10-CM

## 2023-01-13 DIAGNOSIS — Z86711 Personal history of pulmonary embolism: Secondary | ICD-10-CM | POA: Diagnosis not present

## 2023-01-13 DIAGNOSIS — I2584 Coronary atherosclerosis due to calcified coronary lesion: Secondary | ICD-10-CM

## 2023-01-13 DIAGNOSIS — Z981 Arthrodesis status: Secondary | ICD-10-CM

## 2023-01-13 DIAGNOSIS — I700 Atherosclerosis of aorta: Secondary | ICD-10-CM | POA: Diagnosis not present

## 2023-01-13 DIAGNOSIS — R6882 Decreased libido: Secondary | ICD-10-CM

## 2023-01-13 DIAGNOSIS — I2510 Atherosclerotic heart disease of native coronary artery without angina pectoris: Secondary | ICD-10-CM

## 2023-01-13 DIAGNOSIS — Z23 Encounter for immunization: Secondary | ICD-10-CM | POA: Diagnosis not present

## 2023-01-13 MED ORDER — CITALOPRAM HYDROBROMIDE 10 MG PO TABS
10.0000 mg | ORAL_TABLET | Freq: Every day | ORAL | 3 refills | Status: DC
Start: 2023-01-13 — End: 2023-02-18

## 2023-01-13 NOTE — Progress Notes (Unsigned)
Complete physical exam  Patient: Gerald Fernandez   DOB: Jun 29, 1966   57 y.o. Male  MRN: [id number]  Subjective:    Chief Complaint  Patient presents with   Annual Exam    Non fasting.     Gerald Fernandez is a 57 y.o. male who presents today for a complete physical exam. He reports consuming a general diet. Gym/ health club routine includes cardio. He is being followed by infectious disease for a diagnosis of MAC.  They have been helping him with this for approximately 18 months.  He is on multiple medications controlled by them.  His most recent sputum was apparently negative.  He also continues to have difficulty with traumatic left foot arthritis and is followed by pain management.  He also apparently is in the process of potentially getting a joint replacement.  He does have an ankle fusion however is failed since he is still having pain.  He is taking Lyrica to help with the pain.  As part of the workup for his MAC, x-rays study shows evidence of aortic atherosclerosis as well as three-vessel coronary disease.  He has a previous history of pulmonary embolus.  Apparently he is on atorvastatin.  He also admits to being depressed mainly dealing with the death of his mother 5 years ago, difficulty with his wife and now dealing with MAC and continued difficulty with the foot.  He also complains of low libido and difficulty maintaining and getting an erection.  He does continue to smoke.  He would like help dealing with the depression.  Otherwise his family and social history as well as health maintenance and immunizations was reviewed. Most recent fall risk assessment:    01/13/2023    3:32 PM  Fall Risk   Falls in the past year? 0  Number falls in past yr: 0  Injury with Fall? 0  Risk for fall due to : No Fall Risks  Follow up Falls evaluation completed     Most recent depression screenings:    01/13/2023    3:50 PM 04/09/2022    9:51 AM  PHQ 2/9 Scores  PHQ - 2 Score 0 0  PHQ- 9 Score 4      Vision:Within last year and Dental: No current dental problems and Receives regular dental care  {History (Optional):23778}  Patient Care Team: Ronnald Nian, MD as PCP - General (Family Medicine)   Outpatient Medications Prior to Visit  Medication Sig   ADVAIR HFA 115-21 MCG/ACT inhaler INHALE 2 PUFFS INTO THE LUNGS TWICE A DAY   albuterol (VENTOLIN HFA) 108 (90 Base) MCG/ACT inhaler Inhale 2 puffs into the lungs every 6 (six) hours as needed for wheezing or shortness of breath.   Amikacin Sulfate Liposome (ARIKAYCE) 590 MG/8.4ML SUSP Inhale 590 mg into the lungs daily.   atorvastatin (LIPITOR) 10 MG tablet Take 1 tablet (10 mg total) by mouth daily.   azithromycin (ZITHROMAX) 500 MG tablet Take 1 tablet (500 mg total) by mouth daily.   BELBUCA 450 MCG FILM Take 450 mcg by mouth 2 (two) times daily.   clofazimine 50 mg CAPS capsule (for compassionate use) Take 100 mg by mouth daily with breakfast.   dronabinol (MARINOL) 5 MG capsule Take 5 mg by mouth daily.   ethambutol (MYAMBUTOL) 100 MG tablet Take two 100 mg tablets WITH two 400 mg tablets of ethambutol together to equal 1000 mg total   ethambutol (MYAMBUTOL) 400 MG tablet Take two 400 mg tablets  WITH two 100 mg tablets of ethambutol together to equal 1000 mg total   HYDROcodone-acetaminophen (NORCO/VICODIN) 5-325 MG tablet Take 1 tablet by mouth 3 (three) times daily as needed.   ibuprofen (ADVIL) 800 MG tablet Take 800 mg by mouth in the morning and at bedtime.   Multiple Vitamin (MULTIVITAMIN WITH MINERALS) TABS tablet Take 1 tablet by mouth in the morning. Centrum Gummies   ondansetron (ZOFRAN) 4 MG tablet Take 4 mg by mouth 2 (two) times daily as needed.   pregabalin (LYRICA) 100 MG capsule Take 100 mg by mouth at bedtime.   No facility-administered medications prior to visit.    Review of Systems  All other systems reviewed and are negative.         Objective:     BP 128/62   Pulse 81   Temp (!) 97.5 F  (36.4 C) (Oral)   Resp 16   Ht 5' 9.25" (1.759 m)   Wt 133 lb (60.3 kg)   SpO2 99% Comment: room air  BMI 19.50 kg/m  {Vitals History (Optional):23777}  Physical Exam  Alert and in no distress. Tympanic membranes and canals are normal. Pharyngeal area is normal. Neck is supple without adenopathy or thyromegaly. Cardiac exam shows a regular sinus rhythm without murmurs or gallops. Lungs are clear to auscultation.  Abdominal  shows no masses or tenderness.  Genitalia shows possible slight atrophy.  {Show previous labs (optional):23779}    Assessment & Plan:    Routine general medical examination at a health care facility  History of pulmonary embolus (PE)  S/P ankle fusion  Aortic atherosclerosis (HCC) - Plan: Lipid panel  Coronary artery disease due to calcified coronary lesion - Plan: Lipid panel, EKG 12-Lead, Ambulatory referral to Cardiology  Current smoker - Plan: Pneumococcal conjugate vaccine 20-valent (Prevnar 20)  Low libido - Plan: Testosterone  Situational depression - Plan: citalopram (CELEXA) 10 MG tablet  Need for Tdap vaccination - Plan: Tdap vaccine greater than or equal to 7yo IM  Need for vaccination against Streptococcus pneumoniae - Plan: Pneumococcal conjugate vaccine 20-valent (Prevnar 20), CANCELED: Pneumococcal conjugate vaccine 13-valent   Immunization History  Administered Date(s) Administered   DT (Pediatric) 05/11/1987   Influenza Split 06/27/2006, 07/21/2009, 06/27/2010   Influenza,inj,Quad PF,6+ Mos 06/18/2013   PNEUMOCOCCAL CONJUGATE-20 01/13/2023   PPD Test 11/13/1992   Pneumococcal Polysaccharide-23 06/18/2013   Tdap 03/14/2009, 01/13/2023    Health Maintenance  Topic Date Due   COVID-19 Vaccine (1) Never done   COLONOSCOPY (Pts 45-78yrs Insurance coverage will need to be confirmed)  Never done   Zoster Vaccines- Shingrix (1 of 2) Never done   INFLUENZA VACCINE  04/03/2023   DTaP/Tdap/Td (4 - Td or Tdap) 01/12/2033   Hepatitis C  Screening  Completed   HIV Screening  Completed   HPV VACCINES  Aged Out    Discussed his present situation and strongly encouraged him to get involved in counseling.  I will also start him on Celexa and he is to set up an appointment for virtual visit in 1 month.  Think it is worthwhile also checking his testosterone level.  He will continue to be followed by ID. Problem List Items Addressed This Visit       Other   Current smoker   Relevant Orders   Pneumococcal conjugate vaccine 20-valent (Prevnar 20) (Completed)   History of pulmonary embolus (PE)   S/P ankle fusion   Other Visit Diagnoses     Routine general medical examination at a  health care facility    -  Primary   Aortic atherosclerosis [facility])       Relevant Orders   Lipid panel   Coronary artery disease due to calcified coronary lesion       Relevant Orders   Lipid panel   EKG 12-Lead   Ambulatory referral to Cardiology   Low libido       Relevant Orders   Testosterone   Situational depression       Relevant Medications   citalopram (CELEXA) 10 MG tablet   Need for Tdap vaccination       Relevant Orders   Tdap vaccine greater than or equal to 7yo IM (Completed)   Need for vaccination against Streptococcus pneumoniae       Relevant Orders   Pneumococcal conjugate vaccine 20-valent (Prevnar 20) (Completed)      No follow-ups on file.     Benjiman Core, CMA

## 2023-01-14 LAB — LIPID PANEL
Chol/HDL Ratio: 2.9 ratio (ref 0.0–5.0)
Cholesterol, Total: 103 mg/dL (ref 100–199)
HDL: 35 mg/dL — ABNORMAL LOW (ref >39)
LDL Chol Calc (NIH): 50 mg/dL (ref 0–99)
Triglycerides: 89 mg/dL (ref 0–149)
VLDL Cholesterol Cal: 18 mg/dL (ref 5–40)

## 2023-01-14 LAB — TESTOSTERONE: Testosterone: 211 ng/dL — ABNORMAL LOW (ref 264–916)

## 2023-01-14 NOTE — Addendum Note (Signed)
Addended by: Ronnald Nian on: 01/14/2023 01:06 PM   Modules accepted: Orders

## 2023-01-28 LAB — MYCOBACTERIA,CULT W/FLUOROCHROME SMEAR
MICRO NUMBER:: 14809743
SMEAR:: NONE SEEN
SPECIMEN QUALITY:: ADEQUATE

## 2023-02-05 ENCOUNTER — Other Ambulatory Visit: Payer: 59

## 2023-02-05 DIAGNOSIS — R6882 Decreased libido: Secondary | ICD-10-CM

## 2023-02-06 LAB — TESTOSTERONE: Testosterone: 343 ng/dL (ref 264–916)

## 2023-02-18 ENCOUNTER — Telehealth (INDEPENDENT_AMBULATORY_CARE_PROVIDER_SITE_OTHER): Payer: 59 | Admitting: Family Medicine

## 2023-02-18 DIAGNOSIS — F4321 Adjustment disorder with depressed mood: Secondary | ICD-10-CM

## 2023-02-18 MED ORDER — CITALOPRAM HYDROBROMIDE 20 MG PO TABS: 20.0000 mg | ORAL_TABLET | Freq: Every day | ORAL | 3 refills | Status: DC

## 2023-02-18 NOTE — Progress Notes (Signed)
   Subjective:    Patient ID: Gerald Fernandez, male    DOB: [birth year], 57 y.o.   MRN: [id number]  HPI Documentation for virtual audio and video telecommunications through Caregility encounter:  The patient was located at home. 2 patient identifiers used.  The provider was located in the office. The patient did consent to this visit and is aware of possible charges through their insurance for this visit. The other persons participating in this telemedicine service were none. Time spent on call was 5 minutes and in review of previous records >20 minutes total for counseling and coordination of care. This virtual service is not related to other E/M service within previous 7 days.  He now states that within the last week he has noted a definite improvement stating he is somewhere between 30 to 50% better than he was before.  He does have an appointment set up with a therapist for early July.  He is happy with the progress that is made.  Work continues to go fairly well for him. Also recent blood work was reviewed and at this point he has no evidence of hypogonadism. Review of Systems     Objective:   Physical Exam Alert and in no distress otherwise not examined       Assessment & Plan:  Situational depression - Plan: citalopram (CELEXA) 20 MG tablet I will increase him to 20 mg.  We will have another virtual visit in 1 month.  By then hopefully he will have seen the therapist once if not twice.  He was comfortable with that.

## 2023-02-21 ENCOUNTER — Encounter: Payer: Self-pay | Admitting: Physician Assistant

## 2023-02-21 ENCOUNTER — Ambulatory Visit: Payer: 59 | Attending: Physician Assistant | Admitting: Physician Assistant

## 2023-02-21 VITALS — BP 116/62 | HR 64 | Ht 70.0 in | Wt 130.0 lb

## 2023-02-21 DIAGNOSIS — I77810 Thoracic aortic ectasia: Secondary | ICD-10-CM

## 2023-02-21 DIAGNOSIS — I2510 Atherosclerotic heart disease of native coronary artery without angina pectoris: Secondary | ICD-10-CM

## 2023-02-21 NOTE — Patient Instructions (Signed)
Medication Instructions:   Your physician recommends that you continue on your current medications as directed. Please refer to the Current Medication list given to you today.  *If you need a refill on your cardiac medications before your next appointment, please call your pharmacy*  Lab Work: NONE ordered at this time of appointment   If you have labs (blood work) drawn today and your tests are completely normal, you will receive your results only by: MyChart Message (if you have MyChart) OR A paper copy in the mail If you have any lab test that is abnormal or we need to change your treatment, we will call you to review the results.  Testing/Procedures: NONE ordered at this time of appointment   Follow-Up: At [facility], you and your health needs are our priority.  As part of our continuing mission to provide you with exceptional heart care, we have created designated Provider Care Teams.  These Care Teams include your primary Cardiologist (physician) and Advanced Practice Providers (APPs -  Physician Assistants and Nurse Practitioners) who all work together to provide you with the care you need, when you need it.   Your next appointment:   1 year(s)  Provider:   Lillian T O'Neal, MD     Other Instructions  

## 2023-02-21 NOTE — Progress Notes (Signed)
  Cardiology Office Note:  .   Date:  02/21/2023  ID:  Gerald Fernandez, DOB 05/31/1966, MRN [id number] PCP: Ronnald Nian, MD  [facility] Providers Cardiologist:  Reatha Harps, MD { Click to update primary MD,subspecialty MD or APP then REFRESH:1}   History of Present Illness: .   Gerald Fernandez is a 57 y.o. male with past medical history of tobacco abuse/pulmonary MAC, provoked PE,and history of aortic ectasia.  Mother has a history of aortic dissection occurring at age 74.  She also had heavy tobacco use and a high blood pressure.  Maternal aunt also has aorta problem as well.  EKG was normal.  CT showed ascending aorta measurement of 34 mm.  He has chronic dyspnea on exertion related to pulmonary MAC.  He works as a Print production planner for [employer].  His chest CT showed mild coronary calcification.  He was started on statin therapy.  Echocardiogram obtained on 07/25/2021 showed EF 50 to 55%, mild dilatation of the ascending aorta measuring 41 mm.  Dr. Flora Lipps recommended rechecking in 1 year, if no change in the size, would not recommend any further evaluation.  He was started on 10 mg of Lipitor.  Last CT of the chest obtained in January 2024 showed unchanged partial calcified dense masslike consolidation in the apices of the lung consistent with sequela of previous infection or inflammation, aortic atherosclerosis, no evidence of significant aortic aneurysm.  ROS: ***  Studies Reviewed: .        *** Risk Assessment/Calculations:   {Does this patient have ATRIAL FIBRILLATION?:[phone]}         Physical Exam:   VS:  BP 116/62   Pulse 64   Ht 5\' 10"  (1.778 m)   Wt 130 lb (59 kg)   SpO2 98%   BMI 18.65 kg/m    Wt Readings from Last 3 Encounters:  02/21/23 130 lb (59 kg)  01/13/23 133 lb (60.3 kg)  12/11/22 132 lb (59.9 kg)    GEN: Well nourished, well developed in no acute distress NECK: No JVD; No carotid bruits CARDIAC: ***RRR, no murmurs, rubs,  gallops RESPIRATORY:  Clear to auscultation without rales, wheezing or rhonchi  ABDOMEN: Soft, non-tender, non-distended EXTREMITIES:  No edema; No deformity   ASSESSMENT AND PLAN: .   ***    {Are you ordering a CV Procedure (e.g. stress test, cath, DCCV, TEE, etc)?   Press F2        :[id number]}  Dispo: ***  Signed, Azalee Course, PA

## 2023-03-04 ENCOUNTER — Ambulatory Visit (INDEPENDENT_AMBULATORY_CARE_PROVIDER_SITE_OTHER): Payer: 59 | Admitting: Psychology

## 2023-03-04 DIAGNOSIS — F439 Reaction to severe stress, unspecified: Secondary | ICD-10-CM
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
## 2023-03-11 ENCOUNTER — Telehealth: Payer: Self-pay | Admitting: Pharmacist

## 2023-03-11 NOTE — Telephone Encounter (Signed)
2 bottles of clofazimine are located in the pharmacy office when patient needs a refill.  Jennaya Pogue L. Rawad Bochicchio, PharmD, BCIDP, AAHIVP, CPP Clinical Pharmacist Practitioner Infectious Diseases Clinical Pharmacist [facility] 03/11/2023, 3:11 PM

## 2023-03-12 ENCOUNTER — Other Ambulatory Visit: Payer: Self-pay | Admitting: Family Medicine

## 2023-03-12 DIAGNOSIS — F4321 Adjustment disorder with depressed mood: Secondary | ICD-10-CM

## 2023-03-17 ENCOUNTER — Ambulatory Visit: Payer: 59 | Admitting: Psychology

## 2023-03-19 ENCOUNTER — Encounter: Payer: 59 | Admitting: Family Medicine

## 2023-03-20 ENCOUNTER — Ambulatory Visit (INDEPENDENT_AMBULATORY_CARE_PROVIDER_SITE_OTHER): Payer: 59 | Admitting: Internal Medicine

## 2023-03-20 ENCOUNTER — Other Ambulatory Visit: Payer: Self-pay

## 2023-03-20 ENCOUNTER — Encounter: Payer: Self-pay | Admitting: Internal Medicine

## 2023-03-20 VITALS — BP 96/61 | HR 69 | Resp 16 | Ht 70.0 in | Wt 128.3 lb

## 2023-03-20 DIAGNOSIS — Z720 Tobacco use: Secondary | ICD-10-CM

## 2023-03-20 DIAGNOSIS — A310 Pulmonary mycobacterial infection: Secondary | ICD-10-CM

## 2023-03-20 NOTE — Progress Notes (Signed)
[facility]  CHIEF COMPLAINT:    Follow up for MAC infection  SUBJECTIVE:    Gerald Fernandez is a 57 y.o. male with PMHx as below who presents to the clinic for MAC infection.   03/20/23 id clinic f/u He was seen last by my partner dr Earlene Plater 12/2022. He was previously seen at [facility] as well He continues to smoke He has cavitary/nodular process 09/2022 cx negative after persistently positive prior to that.  He has been on therapy since 08/2021 Current regimen azith/ethambutol, clofaz, and inhaled amikacin   --------- He was last seen on 09/18/22 and also saw Dr Valentina Lucks at [facility] on 06/17/22.  He is currently on a regimen of Azithromycin 500mg  PO daily, Ethambutol 1000mg  PO daily, Clofazimine 100mg  PO daily, and inhaled Amikacin.     His most recent AF[employer] sputum cultures from 09/18/22 were finally negative after persistently positive cultures from time of initial diagnosis in December 2022.  Regarding his symptoms, he reports he is doing well today and no major complaints or symptoms.  He reports he continues to have cut back on his tobacco use.  He is tolerating antibiotics with no issues.  He had a repeat CT chest in January 2024 that overall showed stability with some areas of improvement.    From Dr Maretta Los note, "I personally reviewed his chest CT films from 06/2021 and 03/2022 with [name] and his wife. Both the larger right upper lobe cavitary lesion and the smaller left upper lobe cavitary lesion have filled in and I can see some calcifications in the center of the right upper lobe lesion. I do not see any middle or lower lobe disease and he does not have a lot of surrounding emphysema. 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 Given that and ongoing tobacco use, I don't think that pursuing surgical resection is a good idea at this point. I would recommend trying  to optimize his medical therapy, working on smoking cessation, and close monitoring. "  Please see A&P for the details of today's visit and status of the patient's medical problems.   Patient's Medications  New Prescriptions   No medications on file  Previous Medications   ADVAIR HFA 115-21 MCG/ACT INHALER    INHALE 2 PUFFS INTO THE LUNGS TWICE A DAY   ALBUTEROL (VENTOLIN HFA) 108 (90 BASE) MCG/ACT INHALER    Inhale 2 puffs into the lungs every 6 (six) hours as needed for wheezing or shortness of breath.   AMIKACIN SULFATE LIPOSOME (ARIKAYCE) 590 MG/8.4ML SUSP    Inhale 590 mg into the lungs daily.   ATORVASTATIN (LIPITOR) 10 MG TABLET    Take 1 tablet (10 mg total) by mouth daily.   BELBUCA 450 MCG FILM    Take 450 mcg by mouth 2 (two) times daily.   CITALOPRAM (CELEXA) 20 MG TABLET    Take 1 tablet (20 mg total) by mouth daily.   CLOFAZIMINE 50 MG CAPS CAPSULE (FOR COMPASSIONATE USE)    Take 100 mg by mouth daily with breakfast.   DRONABINOL (MARINOL) 5 MG CAPSULE    Take 5 mg by mouth daily.   ETHAMBUTOL (MYAMBUTOL) 100 MG TABLET    Take two 100 mg tablets WITH two 400 mg tablets of ethambutol together to equal 1000 mg total   ETHAMBUTOL (MYAMBUTOL) 400 MG TABLET    Take  two 400 mg tablets WITH two 100 mg tablets of ethambutol together to equal 1000 mg total   HYDROCODONE-ACETAMINOPHEN (NORCO/VICODIN) 5-325 MG TABLET    Take 1 tablet by mouth 3 (three) times daily as needed.   IBUPROFEN (ADVIL) 800 MG TABLET    Take 800 mg by mouth in the morning and at bedtime.   MULTIPLE VITAMIN (MULTIVITAMIN WITH MINERALS) TABS TABLET    Take 1 tablet by mouth in the morning. Centrum Gummies   ONDANSETRON (ZOFRAN) 4 MG TABLET    Take 4 mg by mouth 2 (two) times daily as needed.   PREGABALIN (LYRICA) 100 MG CAPSULE    Take 100 mg by mouth at bedtime.  Modified Medications   No medications on file  Discontinued Medications   No medications on file      Past Medical History:  Diagnosis Date    Allergy    Arthritis    Depression    situational (mother passing)   DVT (deep venous thrombosis) (HCC) 2010   GERD (gastroesophageal reflux disease)    not at this time   Hx of pulmonary embolus 2014   Impingement syndrome of left ankle    PONV (postoperative nausea and vomiting)    Smoker 12/02/2007   Tobacco use disorder     Social History   Tobacco Use   Smoking status: Some Days    Current packs/day: 0.20    Average packs/day: 0.2 packs/day for 28.0 years (5.6 ttl pk-yrs)    Types: Cigarettes   Smokeless tobacco: Former   Tobacco comments:    Pt reports cutting back to 8 cigarettes or less per day, bnm 10/08/21  Vaping Use   Vaping status: Former  Substance Use Topics   Alcohol use: Yes    Alcohol/week: 2.0 standard drinks of alcohol    Types: 2 Standard drinks or equivalent per week    Comment: 2 times a year   Drug use: No    Family History  Problem Relation Age of Onset   Hypertension Mother    COPD Mother    Aortic dissection Mother 104   Hypertension Father    Cancer Father     Allergies  Allergen Reactions   Penicillins Other (See Comments)    From childhood Did it involve swelling of the face/tongue/throat, SOB, or low BP? Yes Did it involve sudden or severe rash/hives, skin peeling, or any reaction on the inside of your mouth or nose? Unknown Did you need to seek medical attention at a hospital or doctor's office? Unknown When did it last happen? childhood reaction. If all above answers are "NO", may proceed with cephalosporin use.     Review of Systems  All other systems reviewed and are negative.  Except as noted above.  OBJECTIVE:    Vitals:   03/20/23 1603  BP: 96/61  Pulse: 69  Resp: 16  SpO2: 98%  Weight: 128 lb 4.8 oz (58.2 kg)  Height: 5\' 10"  (1.778 m)   Body mass index is 18.41 kg/m.  Physical Exam Constitutional:      Appearance: Normal appearance.  HENT:     Head: Normocephalic and atraumatic.  Eyes:      Extraocular Movements: Extraocular movements intact.     Conjunctiva/sclera: Conjunctivae normal.  Abdominal:     General: There is no distension.     Palpations: Abdomen is soft.  Musculoskeletal:     Cervical back: Normal range of motion and neck supple.  Skin:    General: Skin  is warm and dry.     Coloration: Skin is not jaundiced.  Neurological:     General: No focal deficit present.     Mental Status: He is alert and oriented to person, place, and time.  Psychiatric:        Mood and Affect: Mood normal.        Behavior: Behavior normal.      Labs and Microbiology:    Latest Ref Rng & Units 12/11/2022    4:20 AM 09/18/2022   11:33 AM 02/07/2022    9:42 AM  CBC  WBC 3.8 - 10.8 Thousand/uL 7.7  7.8  7.3   Hemoglobin 13.2 - 17.1 g/dL 16.1  09.6  04.5   Hematocrit 38.5 - 50.0 % 43.4  40.7  43.8   Platelets 140 - 400 Thousand/uL 215  212  215       Latest Ref Rng & Units 12/11/2022    4:20 AM 09/18/2022   11:33 AM 02/07/2022    9:42 AM  CMP  Glucose 65 - 99 mg/dL 74  88  83   BUN 7 - 25 mg/dL 14  10  10    Creatinine 0.70 - 1.30 mg/dL 4.09  8.11  9.14   Sodium 135 - 146 mmol/L 140  144  143   Potassium 3.5 - 5.3 mmol/L 4.0  3.7  4.8   Chloride 98 - 110 mmol/L 103  105  107   CO2 20 - 32 mmol/L 29  31  32   Calcium 8.6 - 10.3 mg/dL 8.6  8.2  9.3   Total Protein 6.1 - 8.1 g/dL 6.5  6.0  6.6   Total Bilirubin 0.2 - 1.2 mg/dL 0.6  0.5  0.5   AST 10 - 35 U/L 26  19  20    ALT 9 - 46 U/L 19  13  17        ASSESSMENT & PLAN:    No problem-specific Assessment & Plan notes found for this encounter.    No orders of the defined types were placed in this encounter.    Nodulocavitary mac lung Smoker Copd    Abx (discussed with our pharmacist Cassie) 09/2021-c clofazimine 08/2021-c azithromycin 08/2021-c arikayce   08/2021-10/2021 moxifloxacin 08/2021-06/2022 amikacin  "Started on azith, moxi, ethambutol, and inhaled amikacin to be aggressive. susceptibilities came  back and it was R to moxi, so stopped that and substituted clofaz. had continued sputum production and + cultures despite 6 months of this treatment so changed to IV amikacin. He saw Valentina Lucks at [facility] and he said MIC was too high for IV amikacin to get benefit so advised to switch back to inhaled amikacin and this is where we are now. Rifampin not used initially due to ddi with his partial opioid agonist"     Sputums 3 sets Blood tests Stop smoking See me in 3 months  I have spent a total of 50 minutes of face-to-face and non-face-to-face time, excluding clinical staff time, preparing to see patient, ordering tests and/or medications, and provide counseling the patient     [facility] 03/20/2023, 4:16 PM

## 2023-03-20 NOTE — Patient Instructions (Signed)
Please stop smoking   We need 3 sputum cultures. First thing in morning before brushing, eating, drinking. On 3 separate days for next week   Please also do blood test next week    See me again in 3 months   Plan 1 year of negative sputum cx and 18 months good antibiotics, whichever is longer. The antibiotic regimen will be your current (azith, ethambutol, arikace, and clofazamine)    See eye doctor twice a year while on ethambutol See ent doc for your ear ringing and baseline hearing assessment. Azithromycin can cause hearing loss  thanks

## 2023-03-24 ENCOUNTER — Other Ambulatory Visit: Payer: Self-pay

## 2023-03-24 ENCOUNTER — Other Ambulatory Visit: Payer: Managed Care, Other (non HMO)

## 2023-03-24 DIAGNOSIS — A310 Pulmonary mycobacterial infection: Secondary | ICD-10-CM

## 2023-03-24 DIAGNOSIS — Z720 Tobacco use: Secondary | ICD-10-CM

## 2023-03-24 LAB — CBC
HCT: 40.6 % (ref 38.5–50.0)
Hemoglobin: 13.8 g/dL (ref 13.2–17.1)
MCH: 32.3 pg (ref 27.0–33.0)
MCHC: 34 g/dL (ref 32.0–36.0)
MCV: 95.1 fL (ref 80.0–100.0)
MPV: 11.1 fL (ref 7.5–12.5)
Platelets: 214 10*3/uL (ref 140–400)
RBC: 4.27 10*6/uL (ref 4.20–5.80)
RDW: 11.7 % (ref 11.0–15.0)
WBC: 6 10*3/uL (ref 3.8–10.8)

## 2023-03-24 NOTE — Addendum Note (Signed)
Addended by: Harley Alto on: 03/24/2023 10:17 AM   Modules accepted: Orders

## 2023-03-25 LAB — COMPLETE METABOLIC PANEL WITH GFR
AG Ratio: 1.5 (calc) (ref 1.0–2.5)
ALT: 16 U/L (ref 9–46)
AST: 21 U/L (ref 10–35)
Albumin: 3.7 g/dL (ref 3.6–5.1)
Alkaline phosphatase (APISO): 42 U/L (ref 35–144)
BUN: 10 mg/dL (ref 7–25)
CO2: 30 mmol/L (ref 20–32)
Calcium: 8.4 mg/dL — ABNORMAL LOW (ref 8.6–10.3)
Chloride: 108 mmol/L (ref 98–110)
Creat: 0.75 mg/dL (ref 0.70–1.30)
Globulin: 2.4 g/dL (calc) (ref 1.9–3.7)
Glucose, Bld: 104 mg/dL — ABNORMAL HIGH (ref 65–99)
Potassium: 4.2 mmol/L (ref 3.5–5.3)
Sodium: 142 mmol/L (ref 135–146)
Total Bilirubin: 0.4 mg/dL (ref 0.2–1.2)
Total Protein: 6.1 g/dL (ref 6.1–8.1)
eGFR: 107 mL/min/{1.73_m2} (ref >=60)

## 2023-03-26 LAB — MYCOBACTERIA,CULT W/FLUOROCHROME SMEAR
MICRO NUMBER:: 15233636
SMEAR:: NONE SEEN
SPECIMEN QUALITY:: ADEQUATE

## 2023-03-27 ENCOUNTER — Telehealth: Payer: Self-pay | Admitting: Pharmacist

## 2023-03-27 NOTE — Telephone Encounter (Signed)
Patient picked up 2 bottles of clofazimine on 03/20/23. Supply should last for ~3 months. Next refill due around the middle/end of October.  Zarayah Lanting L. Gwendelyn Lanting, PharmD, BCIDP, AAHIVP, CPP Clinical Pharmacist Practitioner Infectious Diseases Clinical Pharmacist [facility] 03/27/2023, 1:44 PM

## 2023-04-07 ENCOUNTER — Ambulatory Visit (INDEPENDENT_AMBULATORY_CARE_PROVIDER_SITE_OTHER): Payer: 59 | Admitting: Psychology

## 2023-04-07 DIAGNOSIS — F4323 Adjustment disorder with mixed anxiety and depressed mood: Secondary | ICD-10-CM

## 2023-04-07 DIAGNOSIS — F439 Reaction to severe stress, unspecified: Secondary | ICD-10-CM | POA: Diagnosis not present

## 2023-04-07 NOTE — Progress Notes (Signed)
[facility] Counselor/Therapist Progress Note  Patient ID: NAITIK ASTACIO, MRN: [id number]   Date: 04/07/23  Time Spent: 3:32  pm - 4:29 pm : 57 Minutes  Treatment Type: Individual Therapy.  Reported Symptoms: depression  Mental Status Exam: Appearance:  Casual     Behavior: Appropriate  Motor: Normal  Speech/Language:  Normal Rate  Affect: Flat  Mood: dysthymic  Thought process: normal  Thought content:   WNL  Sensory/Perceptual disturbances:   WNL  Orientation: oriented to person, place, time/date, and situation  Attention: Good  Concentration: Good  Memory: WNL  Fund of knowledge:  Good  Insight:   Good  Judgment:  Good  Impulse Control: Good   Risk Assessment: Danger to Self:  No Self-injurious Behavior: No Danger to Others: No Duty to Warn:no Physical Aggression / Violence:No  Access to Firearms a concern: No  Gang Involvement:No   Subjective:   Vanita [name] participated from car, via video, is aware of the limitations of tele-sessions, and consented to treatment. Therapist participated from home office.The patient expressed understanding and agreed to proceed. Kleber reviewed the events of the past week. We reviewed numerous treatment approaches including CBT, BA, Problem Solving, and Solution focused therapy. Psych-education regarding the Willie's diagnosis of Trauma and stressor-related disorder  Adjustment disorder with mixed anxiety and depressed mood was provided during the session. We discussed Constantine Aulet Parrilla's goals treatment goals which include process past events, manage interpersonal stressors, manage overall symptoms, process health related issues, recent adjustments including 55 year old daughter who is pregnant and also had a toddler moving into the home, his daughter returning to an abusive relationship, relationship stressors with wife, manage day-to-day stress, improve frustration tolerance, setting and maintaining boundaries with  others. Vanita [name] provided verbal approval of the treatment plan.   Interventions: Psycho-education & Goal Setting.   Diagnosis:  Trauma and stressor-related disorder  Adjustment disorder with mixed anxiety and depressed mood  Psychiatric Treatment: Yes , by Ronnald Nian, MD   Treatment Plan:  Client Abilities/Strengths Ilias is intelligent, self-aware, and expressive.   Support System: Family and friends.   Client Treatment Preferences Outpatient Therapy.   Client Statement of Needs Keino would like to process past events, manage interpersonal stressors, manage overall symptoms, process health related issues, recent adjustments including 55 year old daughter who is pregnant and also had a toddler moving into the home, his daughter returning to an abusive relationship, relationship stressors with wife, manage day-to-day stress, improve frustration tolerance, setting and maintaining boundaries with others.  Treatment Level Weekly  Symptoms  Depression: loss of interest, feeling down, lethargy, poor appetite (medical), feeling bad about self, and trouble concentrating.  (Status: maintained) Anxiety: Feeling anxious, difficulty managing worry, worrying about different things, restlessness, irritability, and feeling afraid something awful might happen.    (Status: maintained)  Goals:   Akheem experiences symptoms of depression and anxiety.   Treatment plan signed and available on s-drive:  No    Target Date: 04/06/24 Frequency: Weekly  Progress: 0 Modality: individual    Therapist will provide referrals for additional resources as appropriate.  Therapist will provide psycho-education regarding Tjay's diagnosis and corresponding treatment approaches and interventions. Licensed Clinical Social Worker, [place], LCSW will support the patient's ability to achieve the goals identified. will employ CBT, BA, Problem-solving, Solution Focused, Mindfulness,   coping skills, & other evidenced-based practices will be used to promote progress towards healthy functioning to help manage decrease symptoms associated with his diagnosis.  Reduce overall level, frequency, and intensity of the feelings of depression, anxiety and panic evidenced by decreased overall symptoms from 6 to 7 days/week to 0 to 1 days/week per client report for at least 3 consecutive months. Verbally express understanding of the relationship between feelings of depression, anxiety and their impact on thinking patterns and behaviors. Verbalize an understanding of the role that distorted thinking plays in creating fears, excessive worry, and ruminations.  [name] participated in the creation of the treatment plan)   [name], LCSW

## 2023-04-21 ENCOUNTER — Ambulatory Visit: Payer: 59 | Admitting: Psychology

## 2023-04-21 DIAGNOSIS — F439 Reaction to severe stress, unspecified: Secondary | ICD-10-CM | POA: Diagnosis not present

## 2023-04-21 DIAGNOSIS — F4323 Adjustment disorder with mixed anxiety and depressed mood: Secondary | ICD-10-CM

## 2023-04-21 NOTE — Progress Notes (Signed)
[facility] Counselor/Therapist Progress Note  Patient ID: ZYKEE LOYA, MRN: [id number]   Date: 04/21/23  Time Spent: 2:05 pm -2:58 pm : 53 Minutes  Treatment Type: Individual Therapy.  Reported Symptoms: depression  Mental Status Exam: Appearance:  Casual     Behavior: Appropriate  Motor: Normal  Speech/Language:  Normal Rate  Affect: Flat  Mood: dysthymic  Thought process: normal  Thought content:   WNL  Sensory/Perceptual disturbances:   WNL  Orientation: oriented to person, place, time/date, and situation  Attention: Good  Concentration: Good  Memory: WNL  Fund of knowledge:  Good  Insight:   Good  Judgment:  Good  Impulse Control: Good   Risk Assessment: Danger to Self:  No Self-injurious Behavior: No Danger to Others: No Duty to Warn:no Physical Aggression / Violence:No  Access to Firearms a concern: No  Gang Involvement:No   Subjective:   Vanita [name] participated from car, via video, is aware of the limitations of tele-sessions, and consented to treatment. Therapist participated from home office.The patient expressed understanding and agreed to proceed. Zacharian noted marital stressors during the session. We worked on processing  this during the session.  Daryle noted moving out of the family home, temporarily.  He noted the difficulty they have navigating marital stressors and doing so positively and as a team. He noted difference stances on numerous issues. We worked on exploring this. Therapist encouraged Carey to employ fair fighting rules, which were reviewed during the session. He noted worry about his daughter and her unhealthy relationship with her significant other. We worked on processing this as well. Therapist praised Blakelee for his effort during the session. Therapist recommended [name] for couple's counseling and resources will be provided upon request. .   Interventions: CBT & interpersonal.   Diagnosis:  Trauma and  stressor-related disorder  Adjustment disorder with mixed anxiety and depressed mood  Psychiatric Treatment: Yes , by Ronnald Nian, MD   Treatment Plan:  Client Abilities/Strengths Karion is intelligent, self-aware, and expressive.   Support System: Family and friends.   Client Treatment Preferences Outpatient Therapy.   Client Statement of Needs Kain would like to process past events, manage interpersonal stressors, manage overall symptoms, process health related issues, recent adjustments including 27 year old daughter who is pregnant and also had a toddler moving into the home, his daughter returning to an abusive relationship, relationship stressors with wife, manage day-to-day stress, improve frustration tolerance, setting and maintaining boundaries with others.  Treatment Level Weekly  Symptoms  Depression: loss of interest, feeling down, lethargy, poor appetite (medical), feeling bad about self, and trouble concentrating.  (Status: maintained) Anxiety: Feeling anxious, difficulty managing worry, worrying about different things, restlessness, irritability, and feeling afraid something awful might happen.    (Status: maintained)  Goals:   Jamard experiences symptoms of depression and anxiety.   Treatment plan signed and available on s-drive:  No    Target Date: 04/06/24 Frequency: Weekly  Progress: 0 Modality: individual    Therapist will provide referrals for additional resources as appropriate.  Therapist will provide psycho-education regarding Aaric's diagnosis and corresponding treatment approaches and interventions. Licensed Clinical Social Worker, [place], LCSW will support the patient's ability to achieve the goals identified. will employ CBT, BA, Problem-solving, Solution Focused, Mindfulness,  coping skills, & other evidenced-based practices will be used to promote progress towards healthy functioning to help manage decrease symptoms associated  with his diagnosis.   Reduce overall level, frequency, and intensity of the feelings  of depression, anxiety and panic evidenced by decreased overall symptoms from 6 to 7 days/week to 0 to 1 days/week per client report for at least 3 consecutive months. Verbally express understanding of the relationship between feelings of depression, anxiety and their impact on thinking patterns and behaviors. Verbalize an understanding of the role that distorted thinking plays in creating fears, excessive worry, and ruminations.  [name] participated in the creation of the treatment plan)   [name], LCSW

## 2023-05-06 ENCOUNTER — Ambulatory Visit (INDEPENDENT_AMBULATORY_CARE_PROVIDER_SITE_OTHER): Payer: 59 | Admitting: Psychology

## 2023-05-06 DIAGNOSIS — F4323 Adjustment disorder with mixed anxiety and depressed mood: Secondary | ICD-10-CM | POA: Diagnosis not present

## 2023-05-06 NOTE — Progress Notes (Signed)
[facility] Counselor/Therapist Progress Note  Patient ID: Gerald Fernandez, MRN: [id number]   Date: 05/06/23  Time Spent: 4:01 pm - 4:59 pm : 58 Minutes  Treatment Type: Individual Therapy.  Reported Symptoms: depression  Mental Status Exam: Appearance:  Casual     Behavior: Appropriate  Motor: Normal  Speech/Language:  Normal Rate  Affect: Flat  Mood: dysthymic  Thought process: normal  Thought content:   WNL  Sensory/Perceptual disturbances:   WNL  Orientation: oriented to person, place, time/date, and situation  Attention: Good  Concentration: Good  Memory: WNL  Fund of knowledge:  Good  Insight:   Good  Judgment:  Good  Impulse Control: Good   Risk Assessment: Danger to Self:  No Self-injurious Behavior: No Danger to Others: No Duty to Warn:no Physical Aggression / Violence:No  Access to Firearms a concern: No  Gang Involvement:No   Subjective:   57 Gerald Fernandez participated from office with therapist and  consented to treatment. Gerald Fernandez provided feedback regarding the past week including the recent birth of his second grandchild. He noted him and his wife working on Writer for their daughter as she lives with Gerald Fernandez and his wife. He noted, at times, a difference in opinion between him and his wife regarding their daughter's boyfriend. We explored this during the session. He noted his attempts to communicate concerns to his daughter using various approaches to no avail. We worked on feeling identification in relation to her life trajectory and noted feeling sadness, disappointment, and frustration. He noted a sense of grief. He noted feelings of being under-appreciated. We worked on identifying the difference between being supportive and enabling. We worked on processing this. Therapist encouraged Gerald Fernandez to take time to focus on self-care, take breaks from stressors, and to engage in self-care. Gerald Fernandez was receptive to this and expressed  commitment towards the goals. Therapist will provided resources for therapy, for Gerald Fernandez's daughter, upon request. Therapist validated and normalized Gerald Fernandez's feelings and experience and provided supportive therapy.   Interventions: CBT & interpersonal.   Diagnosis:  Adjustment disorder with mixed anxiety and depressed mood  Psychiatric Treatment: Yes , by Gerald Nian, MD   Treatment Plan:  Client Abilities/Strengths Gerald Fernandez is intelligent, self-aware, and expressive.   Support System: Family and friends.   Client Treatment Preferences Outpatient Therapy.   Client Statement of Needs Gerald Fernandez would like to process past events, manage interpersonal stressors, manage overall symptoms, process health related issues, recent adjustments including 55 year old daughter who is pregnant and also had a toddler moving into the home, his daughter returning to an abusive relationship, relationship stressors with wife, manage day-to-day stress, improve frustration tolerance, setting and maintaining boundaries with others.  Treatment Level Weekly  Symptoms  Depression: loss of interest, feeling down, lethargy, poor appetite (medical), feeling bad about self, and trouble concentrating.  (Status: maintained) Anxiety: Feeling anxious, difficulty managing worry, worrying about different things, restlessness, irritability, and feeling afraid something awful might happen.    (Status: maintained)  Goals:   Gerald Fernandez experiences symptoms of depression and anxiety.   Treatment plan signed and available on s-drive:  No    Target Date: 04/06/24 Frequency: Weekly  Progress: 0 Modality: individual    Therapist will provide referrals for additional resources as appropriate.  Therapist will provide psycho-education regarding Gerald Fernandez's diagnosis and corresponding treatment approaches and interventions. Licensed Clinical Social Worker, [place], LCSW will support the patient's ability to achieve the  goals identified. will employ CBT, BA, Problem-solving, Solution  Focused, Mindfulness,  coping skills, & other evidenced-based practices will be used to promote progress towards healthy functioning to help manage decrease symptoms associated with his diagnosis.   Reduce overall level, frequency, and intensity of the feelings of depression, anxiety and panic evidenced by decreased overall symptoms from 6 to 7 days/week to 0 to 1 days/week per client report for at least 3 consecutive months. Verbally express understanding of the relationship between feelings of depression, anxiety and their impact on thinking patterns and behaviors. Verbalize an understanding of the role that distorted thinking plays in creating fears, excessive worry, and ruminations.  Gerald Fernandez participated in the creation of the treatment plan)   Gerald Ovens, LCSW

## 2023-05-07 ENCOUNTER — Ambulatory Visit: Payer: 59 | Admitting: Family Medicine

## 2023-05-07 ENCOUNTER — Encounter: Payer: Self-pay | Admitting: Family Medicine

## 2023-05-07 VITALS — BP 100/68 | HR 68 | Ht 70.0 in | Wt 131.0 lb

## 2023-05-07 DIAGNOSIS — F17200 Nicotine dependence, unspecified, uncomplicated: Secondary | ICD-10-CM

## 2023-05-07 DIAGNOSIS — Z981 Arthrodesis status: Secondary | ICD-10-CM

## 2023-05-07 DIAGNOSIS — Z638 Other specified problems related to primary support group: Secondary | ICD-10-CM

## 2023-05-07 DIAGNOSIS — F4321 Adjustment disorder with depressed mood: Secondary | ICD-10-CM

## 2023-05-07 MED ORDER — DULOXETINE HCL 30 MG PO CPEP: 30.0000 mg | ORAL_CAPSULE | Freq: Every day | ORAL | 1 refills | Status: DC

## 2023-05-07 NOTE — Progress Notes (Signed)
   Subjective:    Patient ID: Gerald Fernandez, male    DOB: [birth year], 57 y.o.   MRN: [id number]  HPI He is here for a recheck.  He has now been in counseling on several occasions since last being seen and does seem to be getting a benefit out of it.  He does not think that the Celexa is doing much good.  He does have a previous history of ankle injury with fusion and is now on pain management for that.  He also continues to smoke.  Apparently his daughter has moved home.  She recently had a second child and is living at home with them.  The father lives with his mother in another city.  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  Review of Systems     Objective:    Physical Exam Alert and in no distress with appropriate affect.       Assessment & Plan:  Situational depression - Plan: DULoxetine (CYMBALTA) 30 MG capsule  Current smoker  S/P ankle fusion  Stress due to family tension I am going to have him stop the Celexa, switch to Cymbalta and call me in 1 month to let me know how he is doing.  Explained that hopefully the Cymbalta will help with his underlying anxiety and depression as well as with his pain management.  Also strongly encouraged him to quit smoking. I then discussed the situation that his daughter now has 2 grandchildren living at home with the father of the child living with his mother.  Strongly suggested that he and his wife sit down and come up with a game plan on what they plan to do and then implemented.  Explained that under the present circumstances they need to be very careful not become enabler's.  Also encouraged him to talk this over with his therapist.  He will call me in 1 month and let me know how the Cymbalta is working.

## 2023-05-08 LAB — MYCOBACTERIA,CULT W/FLUOROCHROME SMEAR

## 2023-05-20 ENCOUNTER — Ambulatory Visit (INDEPENDENT_AMBULATORY_CARE_PROVIDER_SITE_OTHER): Payer: 59 | Admitting: Psychology

## 2023-05-20 DIAGNOSIS — F439 Reaction to severe stress, unspecified: Secondary | ICD-10-CM | POA: Diagnosis not present

## 2023-05-20 DIAGNOSIS — F4323 Adjustment disorder with mixed anxiety and depressed mood: Secondary | ICD-10-CM

## 2023-05-20 NOTE — Progress Notes (Signed)
[facility] Counselor/Therapist Progress Note  Patient ID: Gerald Fernandez, MRN: [id number]   Date: 05/20/23  Time Spent: 4:01 pm - 5:00  pm : 59 Minutes  Treatment Type: Individual Therapy.  Reported Symptoms: depression  Mental Status Exam: Appearance:  Casual     Behavior: Appropriate  Motor: Normal  Speech/Language:  Normal Rate  Affect: Flat  Mood: dysthymic  Thought process: normal  Thought content:   WNL  Sensory/Perceptual disturbances:   WNL  Orientation: oriented to person, place, time/date, and situation  Attention: Good  Concentration: Good  Memory: WNL  Fund of knowledge:  Good  Insight:   Good  Judgment:  Good  Impulse Control: Good   Risk Assessment: Danger to Self:  No Self-injurious Behavior: No Danger to Others: No Duty to Warn:no Physical Aggression / Violence:No  Access to Firearms a concern: No  Gang Involvement:No   Subjective:   Gerald Fernandez participated from office with therapist and  consented to treatment. Gerald Fernandez provided feedback regarding the past week some improvement in communication on a base level. He noted barriers to positive communication including intrusive personalities, extended family, and his daughter's partner. He noted his anxiety and concern about his daughter, Gerald Fernandez, who is struggling to make choices that are both short-term and long-term positive and viable. He provided additional family dynamic feedback regarding his marriage. He noted his feelings regarding past events and discussed the effect of this on his mood. Gerald Fernandez noted his needs and priorities. Therapist validated Gerald Fernandez's feelings and experience, during the session, and provided supportive therapy. A follow-up was scheduled for continued treatment, which he benefits from.   Interventions: CBT & interpersonal.   Diagnosis:  Adjustment disorder with mixed anxiety and depressed mood  Trauma and stressor-related disorder  Psychiatric Treatment:  Yes , by Ronnald Nian, MD   Treatment Plan:  Client Abilities/Strengths Gerald Fernandez is intelligent, self-aware, and expressive.   Support System: Family and friends.   Client Treatment Preferences Outpatient Therapy.   Client Statement of Needs Gerald Fernandez would like to process past events, manage interpersonal stressors, manage overall symptoms, process health related issues, recent adjustments including 55 year old daughter who is pregnant and also had a toddler moving into the home, his daughter returning to an abusive relationship, relationship stressors with wife, manage day-to-day stress, improve frustration tolerance, setting and maintaining boundaries with others.  Treatment Level Weekly  Symptoms  Depression: loss of interest, feeling down, lethargy, poor appetite (medical), feeling bad about self, and trouble concentrating.  (Status: maintained) Anxiety: Feeling anxious, difficulty managing worry, worrying about different things, restlessness, irritability, and feeling afraid something awful might happen.    (Status: maintained)  Goals:   Gerald Fernandez experiences symptoms of depression and anxiety.   Treatment plan signed and available on s-drive:  No    Target Date: 04/06/24 Frequency: Weekly  Progress: 0 Modality: individual    Therapist will provide referrals for additional resources as appropriate.  Therapist will provide psycho-education regarding Marguerite's diagnosis and corresponding treatment approaches and interventions. Licensed Clinical Social Worker, [place], LCSW will support the patient's ability to achieve the goals identified. will employ CBT, BA, Problem-solving, Solution Focused, Mindfulness,  coping skills, & other evidenced-based practices will be used to promote progress towards healthy functioning to help manage decrease symptoms associated with his diagnosis.   Reduce overall level, frequency, and intensity of the feelings of depression, anxiety and  panic evidenced by decreased overall symptoms from 6 to 7 days/week to 0 to 1 days/week  per client report for at least 3 consecutive months. Verbally express understanding of the relationship between feelings of depression, anxiety and their impact on thinking patterns and behaviors. Verbalize an understanding of the role that distorted thinking plays in creating fears, excessive worry, and ruminations.  Gerald Fernandez participated in the creation of the treatment plan)   [name], LCSW

## 2023-06-03 ENCOUNTER — Ambulatory Visit: Payer: 59 | Admitting: Psychology

## 2023-06-03 DIAGNOSIS — F4323 Adjustment disorder with mixed anxiety and depressed mood: Secondary | ICD-10-CM
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
## 2023-06-10 ENCOUNTER — Telehealth: Payer: 59 | Admitting: Family Medicine

## 2023-06-10 VITALS — Ht 70.0 in

## 2023-06-11 NOTE — Progress Notes (Signed)
ooo

## 2023-06-17 ENCOUNTER — Ambulatory Visit (INDEPENDENT_AMBULATORY_CARE_PROVIDER_SITE_OTHER): Payer: 59 | Admitting: Psychology

## 2023-06-17 DIAGNOSIS — F439 Reaction to severe stress, unspecified: Secondary | ICD-10-CM

## 2023-06-17 DIAGNOSIS — F4323 Adjustment disorder with mixed anxiety and depressed mood: Secondary | ICD-10-CM | POA: Diagnosis not present
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
## 2023-06-20 ENCOUNTER — Telehealth: Payer: Self-pay | Admitting: Pharmacist

## 2023-06-20 NOTE — Telephone Encounter (Signed)
Thank you :)

## 2023-06-20 NOTE — Telephone Encounter (Signed)
Received fax from [facility] requesting refills for Roe's Arikayce. Signed and faxed back with 11 refills.  Margarite Gouge, PharmD, CPP, BCIDP, AAHIVP Clinical Pharmacist Practitioner Infectious Diseases Clinical Pharmacist [facility]

## 2023-06-24 ENCOUNTER — Ambulatory Visit: Payer: Self-pay | Admitting: Internal Medicine

## 2023-06-25 ENCOUNTER — Telehealth: Payer: 59 | Admitting: Family Medicine

## 2023-06-30 ENCOUNTER — Telehealth: Payer: Self-pay | Admitting: Pharmacist

## 2023-06-30 NOTE — Telephone Encounter (Signed)
2 bottles of clofazimine are located in the pharmacy office when patient needs a refill.  Octavie Westerhold L. Franki Alcaide, PharmD, BCIDP, AAHIVP, CPP Clinical Pharmacist Practitioner Infectious Diseases Clinical Pharmacist [facility] 06/30/2023, 3:37 PM

## 2023-07-01 ENCOUNTER — Ambulatory Visit: Payer: Managed Care, Other (non HMO) | Admitting: Internal Medicine

## 2023-07-01 ENCOUNTER — Ambulatory Visit: Payer: 59 | Admitting: Psychology

## 2023-07-02 ENCOUNTER — Telehealth: Payer: 59 | Admitting: Family Medicine

## 2023-07-02 ENCOUNTER — Encounter: Payer: Self-pay | Admitting: Family Medicine

## 2023-07-02 VITALS — Ht 70.0 in | Wt 127.0 lb

## 2023-07-02 DIAGNOSIS — F4321 Adjustment disorder with depressed mood: Secondary | ICD-10-CM | POA: Diagnosis not present

## 2023-07-02 DIAGNOSIS — A310 Pulmonary mycobacterial infection: Secondary | ICD-10-CM

## 2023-07-02 MED ORDER — DULOXETINE HCL 60 MG PO CPEP: 60.0000 mg | ORAL_CAPSULE | Freq: Every day | ORAL | 3 refills | Status: DC

## 2023-07-02 NOTE — Progress Notes (Signed)
Subjective:    Patient ID: Gerald Fernandez, male    DOB: 11-02-1965, 57 y.o.   MRN: [id number]  HPI Documentation for virtual audio and video telecommunications through Caregility encounter:  The patient was located at home. 2 patient identifiers used.  The provider was located in the office. The patient did consent to this visit and is aware of possible charges through their insurance for this visit.  The other persons participating in this telemedicine service were none. Time spent on call was 5 minutes and in review of previous records >20 minutes total for counseling and coordination of care.  This virtual service is not related to other E/M service within previous 7 days.  He is now involved in counseling and doing quite nicely.  He has been several times and finds it to be quite useful.  Plans to continue with this.  He does think that with the Cymbalta he is roughly 40 to 50% better.  Feels very good about the whole process.  Review of Systems     Objective:    Physical Exam Alert and in no distress with appropriate affect.       Assessment & Plan:  Situational depression - Plan: DULoxetine (CYMBALTA) 60 MG capsule He seems to be gaining good insight into the underlying issues that has had his life and terminal for the last 10 or plus years.  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 I will increase his Cymbalta to 60 mg recheck this in about 1 month.

## 2023-07-10 ENCOUNTER — Encounter: Payer: 59 | Admitting: Psychology

## 2023-07-10 ENCOUNTER — Ambulatory Visit (INDEPENDENT_AMBULATORY_CARE_PROVIDER_SITE_OTHER): Payer: Managed Care, Other (non HMO) | Admitting: Internal Medicine

## 2023-07-10 ENCOUNTER — Other Ambulatory Visit: Payer: Self-pay

## 2023-07-10 ENCOUNTER — Encounter: Payer: Self-pay | Admitting: Internal Medicine

## 2023-07-10 VITALS — Ht 70.0 in | Wt 131.0 lb

## 2023-07-10 DIAGNOSIS — F4323 Adjustment disorder with mixed anxiety and depressed mood: Secondary | ICD-10-CM

## 2023-07-10 DIAGNOSIS — A310 Pulmonary mycobacterial infection: Secondary | ICD-10-CM

## 2023-07-10 NOTE — Patient Instructions (Signed)
Make sure these are your MAC medications:  Azithromycin 500 mg once a day Ethambutol 1000 mg once a day (2x 100 and 2x 400) Clofazimine   Inhaled arikace    Let's plan to get sputum and chest ct   As you are solidly stable in your functional status and no sign of inflammation at this time, if your chest ct is stable, regardless of your sputum result, we can start discussing if you want to stop antibiotics especially if they are giving you any adverse side effect   F/u 3 months

## 2023-07-10 NOTE — Progress Notes (Signed)
[facility]  CHIEF COMPLAINT:    Follow up for MAC infection  SUBJECTIVE:    Gerald Fernandez is a 57 y.o. male with PMHx as below who presents to the clinic for MAC infection.    07/10/2023 id clinic f/u    03/20/23 id clinic f/u He was seen last by my partner dr Gerald Fernandez 12/2022. He was previously seen at [facility] as well He continues to smoke He has cavitary/nodular process 09/2022 cx negative after persistently positive prior to that.  He has been on therapy since 08/2021 Current regimen azith/ethambutol, clofaz, and inhaled amikacin   --------- He was last seen on 09/18/22 and also saw Dr Gerald Fernandez at [facility] on 06/17/22.  He is currently on a regimen of Azithromycin 500mg  PO daily, Ethambutol 1000mg  PO daily, Clofazimine 100mg  PO daily, and inhaled Amikacin.     His most recent AFB sputum cultures from 09/18/22 were finally negative after persistently positive cultures from time of initial diagnosis in December 2022.  Regarding his symptoms, he reports he is doing well today and no major complaints or symptoms.  He reports he continues to have cut back on his tobacco use.  He is tolerating antibiotics with no issues.  He had a repeat CT chest in January 2024 that overall showed stability with some areas of improvement.    From Dr Maretta Los note, "I personally reviewed his chest CT films from 06/2021 and 03/2022 with Gerald Fernandez. Both the larger right upper lobe cavitary lesion and the smaller left upper lobe cavitary lesion have filled in and I can see some calcifications in the center of the right upper lobe lesion. I do not see any middle or lower lobe disease and he does not have a lot of surrounding emphysema. 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 Given that and ongoing tobacco use, I don't think that pursuing surgical resection is a good idea at this  point. I would recommend trying to optimize his medical therapy, working on smoking cessation, and close monitoring. "  Please see A&P for the details of today's visit and status of the patient's medical problems.   Patient's Medications  New Prescriptions   No medications on file  Previous Medications   ADVAIR HFA 115-21 MCG/ACT INHALER    INHALE 2 PUFFS INTO THE LUNGS TWICE A DAY   ALBUTEROL (VENTOLIN HFA) 108 (90 BASE) MCG/ACT INHALER    Inhale 2 puffs into the lungs every 6 (six) hours as needed for wheezing or shortness of breath.   AMIKACIN SULFATE LIPOSOME (ARIKAYCE) 590 MG/8.4ML SUSP    Inhale 590 mg into the lungs daily.   ATORVASTATIN (LIPITOR) 10 MG TABLET    Take 1 tablet (10 mg total) by mouth daily.   CLOFAZIMINE 50 MG CAPS CAPSULE (FOR COMPASSIONATE USE)    Take 100 mg by mouth daily with breakfast.   DULOXETINE (CYMBALTA) 60 MG CAPSULE    Take 1 capsule (60 mg total) by mouth daily.   ETHAMBUTOL (MYAMBUTOL) 100 MG TABLET    Take two 100 mg tablets WITH two 400 mg tablets of ethambutol together to equal 1000 mg total   ETHAMBUTOL (MYAMBUTOL) 400 MG TABLET    Take two 400 mg tablets WITH two 100 mg tablets of ethambutol together to equal 1000 mg total   IBUPROFEN (ADVIL) 800 MG TABLET  Take 800 mg by mouth in the morning and at bedtime.   MULTIPLE VITAMIN (MULTIVITAMIN WITH MINERALS) TABS TABLET    Take 1 tablet by mouth in the morning. Centrum Gummies   OXYCODONE-ACETAMINOPHEN (PERCOCET/ROXICET) 5-325 MG TABLET    Take 1 tablet by mouth 3 (three) times daily as needed.   PREGABALIN (LYRICA) 100 MG CAPSULE    Take 100 mg by mouth at bedtime.  Modified Medications   No medications on file  Discontinued Medications   No medications on file      Past Medical History:  Diagnosis Date   Allergy    Arthritis    Depression    situational (mother passing)   DVT (deep venous thrombosis) (HCC) 2010   GERD (gastroesophageal reflux disease)    not at this time   Hx of pulmonary  embolus 2014   Impingement syndrome of left ankle    PONV (postoperative nausea and vomiting)    Smoker 12/02/2007   Tobacco use disorder     Social History   Tobacco Use   Smoking status: Some Days    Current packs/day: 0.20    Average packs/day: 0.2 packs/day for 28.0 years (5.6 ttl pk-yrs)    Types: Cigarettes   Smokeless tobacco: Former   Tobacco comments:    Pt reports cutting back to 8 cigarettes or less per day, bnm 10/08/21  Vaping Use   Vaping status: Former  Substance Use Topics   Alcohol use: Yes    Alcohol/week: 2.0 standard drinks of alcohol    Types: 2 Standard drinks or equivalent per week    Comment: 2 times a year   Drug use: No    Family History  Problem Relation Age of Onset   Hypertension Mother    COPD Mother    Aortic dissection Mother 64   Hypertension Father    Cancer Father     Allergies  Allergen Reactions   Penicillins Other (See Comments)    From childhood Did it involve swelling of the face/tongue/throat, SOB, or low BP? Yes Did it involve sudden or severe rash/hives, skin peeling, or any reaction on the inside of your mouth or nose? Unknown Did you need to seek medical attention at a hospital or doctor's office? Unknown When did it last happen? childhood reaction. If all above answers are "NO", may proceed with cephalosporin use.     Review of Systems  All other systems reviewed and are negative.  Except as noted above.  OBJECTIVE:    Vitals:   07/10/23 1557  Weight: 131 lb (59.4 kg)  Height: 5\' 10"  (1.778 m)   Body mass index is 18.8 kg/m.  Physical Exam Constitutional:      Appearance: Normal appearance.  HENT:     Head: Normocephalic and atraumatic.  Eyes:     Extraocular Movements: Extraocular movements intact.     Conjunctiva/sclera: Conjunctivae normal.  Abdominal:     General: There is no distension.     Palpations: Abdomen is soft.  Musculoskeletal:     Cervical back: Normal range of motion and neck  supple.  Skin:    General: Skin is warm and dry.     Coloration: Skin is not jaundiced.  Neurological:     General: No focal deficit present.     Mental Status: He is alert and oriented to person, place, and time.  Psychiatric:        Mood and Affect: Mood normal.        Behavior:  Behavior normal.      Labs and Microbiology:    Latest Ref Rng & Units 03/24/2023   10:17 AM 12/11/2022    4:20 AM 09/18/2022   11:33 AM  CBC  WBC 3.8 - 10.8 Thousand/uL 6.0  7.7  7.8   Hemoglobin 13.2 - 17.1 g/dL 91.4  78.2  95.6   Hematocrit 38.5 - 50.0 % 40.6  43.4  40.7   Platelets 140 - 400 Thousand/uL 214  215  212       Latest Ref Rng & Units 03/24/2023   10:17 AM 12/11/2022    4:20 AM 09/18/2022   11:33 AM  CMP  Glucose 65 - 99 mg/dL 213  74  88   BUN 7 - 25 mg/dL 10  14  10    Creatinine 0.70 - 1.30 mg/dL 0.86  5.78  4.69   Sodium 135 - 146 mmol/L 142  140  144   Potassium 3.5 - 5.3 mmol/L 4.2  4.0  3.7   Chloride 98 - 110 mmol/L 108  103  105   CO2 20 - 32 mmol/L 30  29  31    Calcium 8.6 - 10.3 mg/dL 8.4  8.6  8.2   Total Protein 6.1 - 8.1 g/dL 6.1  6.5  6.0   Total Bilirubin 0.2 - 1.2 mg/dL 0.4  0.6  0.5   AST 10 - 35 U/L 21  26  19    ALT 9 - 46 U/L 16  19  13        Imaging: Reviewed    ASSESSMENT & PLAN:    No problem-specific Assessment & Plan notes found for this encounter.    No orders of the defined types were placed in this encounter.    Nodulocavitary mac lung Smoker Copd    Abx (discussed with our pharmacist Cassie) 09/2021-c clofazimine 08/2021-c ethambutol 08/2021-c azithromycin 08/2021-c arikayce    08/2021-10/2021 moxifloxacin 08/2021-06/2022 amikacin  "Started on azith, moxi, ethambutol, and inhaled amikacin to be aggressive. susceptibilities came back and it was R to moxi, so stopped that and substituted clofaz. had continued sputum production and + cultures despite 6 months of this treatment so changed to IV amikacin. He saw Gerald Fernandez at [facility] and he  said MIC was too high for IV amikacin to get benefit so advised to switch back to inhaled amikacin and this is where we are now. Rifampin not used initially due to ddi with his partial opioid agonist"     Sputums 3 sets Blood tests Stop smoking See me in 3 months   ---------- 07/10/2023 id assessment Still smokes -- advise stopping smoking No sx change since on MAC treatment (2 miles biking; walk flat ground 2 miles [place] trouble); no b symptoms. Good appetite No abx side effect Yet to give sputum Due for repeat chest ct   Discussed he has been on mac tx for 2 years and no change in symptoms. We discussed potentially if ct stable/improved and despite sputum inability to clear we might be able to stop antibiotics because utlimately goals is quality of life and also because high risk of mac recolonization/reinfection   F/u 3 months Labs today Chest ct repeat    Tonita [facility] 07/10/2023, 4:00 PM

## 2023-07-10 NOTE — Progress Notes (Signed)
This encounter was created in error - please disregard.

## 2023-07-10 NOTE — Progress Notes (Deleted)
° ° ° ° ° ° ° ° ° ° ° ° ° ° °  Gerald Herzberg, LCSW °

## 2023-07-11 LAB — COMPLETE METABOLIC PANEL WITH GFR
AG Ratio: 1.7 (calc) (ref 1.0–2.5)
ALT: 12 U/L (ref 9–46)
AST: 17 U/L (ref 10–35)
Albumin: 3.8 g/dL (ref 3.6–5.1)
Alkaline phosphatase (APISO): 39 U/L (ref 35–144)
BUN/Creatinine Ratio: 17 (calc) (ref 6–22)
BUN: 11 mg/dL (ref 7–25)
CO2: 30 mmol/L (ref 20–32)
Calcium: 8 mg/dL — ABNORMAL LOW (ref 8.6–10.3)
Chloride: 104 mmol/L (ref 98–110)
Creat: 0.65 mg/dL — ABNORMAL LOW (ref 0.70–1.30)
Globulin: 2.3 g/dL (ref 1.9–3.7)
Glucose, Bld: 78 mg/dL (ref 65–99)
Potassium: 3.8 mmol/L (ref 3.5–5.3)
Sodium: 142 mmol/L (ref 135–146)
Total Bilirubin: 0.5 mg/dL (ref 0.2–1.2)
Total Protein: 6.1 g/dL (ref 6.1–8.1)
eGFR: 111 mL/min/{1.73_m2} (ref >=60)

## 2023-07-11 LAB — CBC
HCT: 40.6 % (ref 38.5–50.0)
Hemoglobin: 13.6 g/dL (ref 13.2–17.1)
MCH: 31.9 pg (ref 27.0–33.0)
MCHC: 33.5 g/dL (ref 32.0–36.0)
MCV: 95.1 fL (ref 80.0–100.0)
MPV: 10.3 fL (ref 7.5–12.5)
Platelets: 229 10*3/uL (ref 140–400)
RBC: 4.27 10*6/uL (ref 4.20–5.80)
RDW: 11.7 % (ref 11.0–15.0)
WBC: 7.1 10*3/uL (ref 3.8–10.8)

## 2023-07-11 LAB — C-REACTIVE PROTEIN: CRP: <3 mg/L (ref <8.0)

## 2023-07-22 ENCOUNTER — Ambulatory Visit (INDEPENDENT_AMBULATORY_CARE_PROVIDER_SITE_OTHER): Payer: 59 | Admitting: Psychology

## 2023-07-22 DIAGNOSIS — F439 Reaction to severe stress, unspecified: Secondary | ICD-10-CM | POA: Diagnosis not present

## 2023-07-22 DIAGNOSIS — F4323 Adjustment disorder with mixed anxiety and depressed mood: Secondary | ICD-10-CM

## 2023-07-22 NOTE — Progress Notes (Signed)
[facility] Counselor/Therapist Progress Note  Patient ID: AHMET BUTZER, MRN: [id number]   Date: 07/22/23  Time Spent: 4:02 pm - 5:01 pm : 59 Minutes  Treatment Type: Individual Therapy.  Reported Symptoms: depression  Mental Status Exam: Appearance:  Casual     Behavior: Appropriate  Motor: Normal  Speech/Language:  Normal Rate  Affect: Congruent  Mood: dysthymic  Thought process: normal  Thought content:   WNL  Sensory/Perceptual disturbances:   WNL  Orientation: oriented to person, place, time/date, and situation  Attention: Good  Concentration: Good  Memory: WNL  Fund of knowledge:  Good  Insight:   Good  Judgment:  Good  Impulse Control: Good   Risk Assessment: Danger to Self:  No Self-injurious Behavior: No Danger to Others: No Duty to Warn:no Physical Aggression / Violence:No  Access to Firearms a concern: No  Gang Involvement:No   Subjective:   Vanita [name] participated from office with therapist and  consented to treatment. Jahri provided feedback regarding the past week. He noted moving out of the family home. He noted significant stress. He noted frustration regarding his daughter's boyfriend moving into the family home and noted that he would be "blamed" for this. He noted working on separating finances. He noted his current level of stress related to this current transition. We discussed the importance of identifying areas of control and lack of control, identifying boundaries for self and others, and engaging in consistent self-care. He noted feeling hurt by his wife negatively affecting his relationship with his children. We discussed the importance of self-care, focusing on positives, limiting rumination, and proactively managing stress. Therapist introduced developing worry-time as a method to manage rumination. Therapist provided handout, via email for reference and review, and encouraged Taiga to review this and apply this going  forward. Therapist validated Tiegan's feelings and experience and provided supportive therapy. A follow-up was scheduled for continued treatment.   Interventions: CBT & interpersonal.   Diagnosis:  Adjustment disorder with mixed anxiety and depressed mood  Trauma and stressor-related disorder  Psychiatric Treatment: Yes , by Ronnald Nian, MD   Treatment Plan:  Client Abilities/Strengths Khalan is intelligent, self-aware, and expressive.   Support System: Family and friends.   Client Treatment Preferences Outpatient Therapy.   Client Statement of Needs Kelley would like to process past events, manage interpersonal stressors, manage overall symptoms, process health related issues, recent adjustments including 55 year old daughter who is pregnant and also had a toddler moving into the home, his daughter returning to an abusive relationship, relationship stressors with wife, manage day-to-day stress, improve frustration tolerance, setting and maintaining boundaries with others.  Treatment Level Weekly  Symptoms  Depression: loss of interest, feeling down, lethargy, poor appetite (medical), feeling bad about self, and trouble concentrating.  (Status: maintained) Anxiety: Feeling anxious, difficulty managing worry, worrying about different things, restlessness, irritability, and feeling afraid something awful might happen.    (Status: maintained)  Goals:   Johnel experiences symptoms of depression and anxiety.   Treatment plan signed and available on s-drive:  Yes    Target Date: 04/06/24 Frequency: Weekly  Progress: 0 Modality: individual    Therapist will provide referrals for additional resources as appropriate.  Therapist will provide psycho-education regarding Caydan's diagnosis and corresponding treatment approaches and interventions. Licensed Clinical Social Worker, [place], LCSW will support the patient's ability to achieve the goals identified. will employ  CBT, BA, Problem-solving, Solution Focused, Mindfulness,  coping skills, & other evidenced-based practices will be  used to promote progress towards healthy functioning to help manage decrease symptoms associated with his diagnosis.   Reduce overall level, frequency, and intensity of the feelings of depression, anxiety and panic evidenced by decreased overall symptoms from 6 to 7 days/week to 0 to 1 days/week per client report for at least 3 consecutive months. Verbally express understanding of the relationship between feelings of depression, anxiety and their impact on thinking patterns and behaviors. Verbalize an understanding of the role that distorted thinking plays in creating fears, excessive worry, and ruminations.  [name] participated in the creation of the treatment plan)   [name], LCSW

## 2023-07-29 ENCOUNTER — Other Ambulatory Visit (HOSPITAL_COMMUNITY): Payer: Self-pay

## 2023-07-29 ENCOUNTER — Telehealth: Payer: Self-pay

## 2023-07-29 NOTE — Telephone Encounter (Signed)
[facility] Patient Advocate Encounter  Prior Authorization for [facility] has been approved.    PA# [id number] Effective dates: 06/29/23 through 07/28/24  Patients co-pay is $0.00.   [facility] will continue to follow.  Clearance Coots, CPhT Specialty Pharmacy Patient [facility] for [facility] Disease Phone: [phone] Fax:  [phone]

## 2023-07-29 NOTE — Telephone Encounter (Signed)
[facility] Patient Advocate Encounter   Received notification from [employer] that prior authorization for Arikayce is required.   PA submitted on 07/29/23 Key [id number] Status is pending  Once approved call [facility] [phone]     [facility] will continue to follow.   Clearance Coots, CPhT Specialty Pharmacy Patient [facility] for Infectious Disease Phone: [phone] Fax:  [phone]

## 2023-08-07 ENCOUNTER — Ambulatory Visit (HOSPITAL_COMMUNITY): Payer: Managed Care, Other (non HMO)

## 2023-08-13 ENCOUNTER — Ambulatory Visit (HOSPITAL_COMMUNITY)
Admission: RE | Admit: 2023-08-13 | Discharge: 2023-08-13 | Disposition: A | Discharge status: Home / Self Care | Payer: Managed Care, Other (non HMO) | Source: Ambulatory Visit | Attending: Internal Medicine | Admitting: Internal Medicine

## 2023-08-13 DIAGNOSIS — A310 Pulmonary mycobacterial infection: Secondary | ICD-10-CM | POA: Diagnosis present

## 2023-08-19 ENCOUNTER — Other Ambulatory Visit: Payer: Self-pay

## 2023-08-19 ENCOUNTER — Other Ambulatory Visit: Payer: Managed Care, Other (non HMO)

## 2023-08-19 DIAGNOSIS — Z720 Tobacco use: Secondary | ICD-10-CM

## 2023-08-19 DIAGNOSIS — A310 Pulmonary mycobacterial infection: Secondary | ICD-10-CM

## 2023-08-20 ENCOUNTER — Other Ambulatory Visit: Payer: Managed Care, Other (non HMO)

## 2023-08-20 ENCOUNTER — Other Ambulatory Visit: Payer: Self-pay

## 2023-08-20 DIAGNOSIS — A310 Pulmonary mycobacterial infection: Secondary | ICD-10-CM

## 2023-08-20 DIAGNOSIS — Z720 Tobacco use: Secondary | ICD-10-CM

## 2023-08-21 ENCOUNTER — Other Ambulatory Visit: Payer: Self-pay | Admitting: Internal Medicine

## 2023-08-21 ENCOUNTER — Other Ambulatory Visit: Payer: Self-pay

## 2023-08-21 ENCOUNTER — Other Ambulatory Visit: Payer: Managed Care, Other (non HMO)

## 2023-08-21 DIAGNOSIS — A319 Mycobacterial infection, unspecified: Secondary | ICD-10-CM

## 2023-08-21 NOTE — Addendum Note (Signed)
Addended by: Harley Alto on: 08/21/2023 10:06 AM   Modules accepted: Orders

## 2023-08-21 NOTE — Progress Notes (Signed)
Afb sputum cx ordered   3 sets can be done over the course of the next 1-4 weeks  To be done in the early morning before eating/drinking anything or even brushing teeth

## 2023-08-22 ENCOUNTER — Ambulatory Visit: Payer: 59 | Admitting: Psychology

## 2023-08-22 DIAGNOSIS — F4323 Adjustment disorder with mixed anxiety and depressed mood: Secondary | ICD-10-CM

## 2023-08-22 NOTE — Progress Notes (Signed)
[facility] Counselor/Therapist Progress Note  Patient ID: Gerald Fernandez, MRN: [id number]   Date: 08/22/23  Time Spent: 2:03 pm -  2:43pm :  40 Minutes  Treatment Type: Individual Therapy.  Reported Symptoms: depression  Mental Status Exam: Appearance:  Casual     Behavior: Appropriate  Motor: Normal  Speech/Language:  Normal Rate  Affect: Congruent  Mood: dysthymic  Thought process: normal  Thought content:   WNL  Sensory/Perceptual disturbances:   WNL  Orientation: oriented to person, place, time/date, and situation  Attention: Good  Concentration: Good  Memory: WNL  Fund of knowledge:  Good  Insight:   Good  Judgment:  Good  Impulse Control: Good   Risk Assessment: Danger to Self:  No Self-injurious Behavior: No Danger to Others: No Duty to Warn:no Physical Aggression / Violence:No  Access to Firearms a concern: No  Gang Involvement:No   Subjective:   Gerald Fernandez participated from his car, via video, and consented to treatment. Therapist participated from home office.  Gerald Fernandez provided feedback regarding the past week. He noted reflecting on the situation at hand and noted feeling guarded. He noted recent transitions including his son not currently responding to his messages. Additionally, he noted that his grandchildren's father is living in the family condo that he is financially responsible for. He noted having to recently leave his brother's home due to his wife's behavior. He noted that things, in the past few months, "being a little heavy".We discussed the importance of identifying, setting, and maintaining boundaries. Therapist modeled this during the session. Therapist encouraged Gerald Fernandez to identify specific boundaries, ways to disengage during stressful situations, and encouraged self-care. Therapist validated and normalized Gerald Fernandez's experience and feelings and provided supportive therapy. A follow-up was scheduled for continued treatment.  Gerald Fernandez was engaged and motivated during the session and expressed commitment towards goals.    Interventions: CBT & interpersonal.   Diagnosis:  Adjustment disorder with mixed anxiety and depressed mood  Psychiatric Treatment: Yes , by Ronnald Nian, MD   Treatment Plan:  Client Abilities/Strengths Kaynon is intelligent, self-aware, and expressive.   Support System: Family and friends.   Client Treatment Preferences Outpatient Therapy.   Client Statement of Needs Gerald Fernandez would like to process past events, manage interpersonal stressors, manage overall symptoms, process health related issues, recent adjustments including 35 year old daughter who is pregnant and also had a toddler moving into the home, his daughter returning to an abusive relationship, relationship stressors with wife, manage day-to-day stress, improve frustration tolerance, setting and maintaining boundaries with others.  Treatment Level Weekly  Symptoms  Depression: loss of interest, feeling down, lethargy, poor appetite (medical), feeling bad about self, and trouble concentrating.  (Status: maintained) Anxiety: Feeling anxious, difficulty managing worry, worrying about different things, restlessness, irritability, and feeling afraid something awful might happen.    (Status: maintained)  Goals:   Gerald Fernandez experiences symptoms of depression and anxiety.   Treatment plan signed and available on s-drive:  Yes    Target Date: 04/06/24 Frequency: Weekly  Progress: 0 Modality: individual    Therapist will provide referrals for additional resources as appropriate.  Therapist will provide psycho-education regarding Gerald Fernandez's diagnosis and corresponding treatment approaches and interventions. Licensed Clinical Social Worker, [place], LCSW will support the patient's ability to achieve the goals identified. will employ CBT, BA, Problem-solving, Solution Focused, Mindfulness,  coping skills, & other  evidenced-based practices will be used to promote progress towards healthy functioning to help manage decrease symptoms associated with  his diagnosis.   Reduce overall level, frequency, and intensity of the feelings of depression, anxiety and panic evidenced by decreased overall symptoms from 6 to 7 days/week to 0 to 1 days/week per client report for at least 3 consecutive months. Verbally express understanding of the relationship between feelings of depression, anxiety and their impact on thinking patterns and behaviors. Verbalize an understanding of the role that distorted thinking plays in creating fears, excessive worry, and ruminations.  Gerald Fernandez participated in the creation of the treatment plan)   [name], LCSW

## 2023-09-29 ENCOUNTER — Other Ambulatory Visit: Payer: Self-pay | Admitting: Family Medicine

## 2023-09-30 NOTE — Telephone Encounter (Signed)
Left message for patient

## 2023-09-30 NOTE — Telephone Encounter (Signed)
This came over for refill, not even sure that you prescribe this for him. Please advise.

## 2023-10-01 ENCOUNTER — Ambulatory Visit: Payer: Self-pay | Admitting: Psychology

## 2023-10-01 DIAGNOSIS — F4323 Adjustment disorder with mixed anxiety and depressed mood: Secondary | ICD-10-CM

## 2023-10-01 DIAGNOSIS — F439 Reaction to severe stress, unspecified: Secondary | ICD-10-CM

## 2023-10-01 NOTE — Progress Notes (Unsigned)
[facility] Counselor/Therapist Progress Note  Patient ID: OGDEN HANDLIN, MRN: [id number]   Date: 10/01/23  Time Spent: 3:02 pm - 3:56 pm :  56 Minutes  Treatment Type: Individual Therapy.  Reported Symptoms: depression  Mental Status Exam: Appearance:  Casual     Behavior: Appropriate  Motor: Normal  Speech/Language:  Normal Rate  Affect: Congruent  Mood: dysthymic  Thought process: normal  Thought content:   WNL  Sensory/Perceptual disturbances:   WNL  Orientation: oriented to person, place, time/date, and situation  Attention: Good  Concentration: Good  Memory: WNL  Fund of knowledge:  Good  Insight:   Good  Judgment:  Good  Impulse Control: Good   Risk Assessment: Danger to Self:  No Self-injurious Behavior: No Danger to Others: No Duty to Warn:no Physical Aggression / Violence:No  Access to Firearms a concern: No  Gang Involvement:No   Subjective:   Vanita [name] participated from his home, via video, and consented to treatment. Therapist participated from home office.  Marckus provided feedback regarding the past week. He noted recently receiving communication from his son after reduced communication. He noted that his son "doesn't want to allow that" in relation to keeping an "open mind". He noted that his son is choosing not to have a relationship at this time and Abigail noted being respectful of this. He noted his daughter "waxing and waning". He noted his worry and frustration with these situations. We worked on highlighting areas of control and ways to assert control in healthy areas, setting boundaries, and being assertive with others. We worked on processing his stressors and experience during the session. We worked identifying ways to balance day-to-day life, identifying positives, and being in the moment. We worked on identifying boundaries, reviewing communication and assertiveness, and ways to disengage from antagonistic conversations.  Therapist validated Zayne's feelings and experience. Therapist encouraged self-care, boundaries for self and others, and focus on engagement in enjoyable activities. Meliton was engaged and motivated during the session. A follow-up was scheduled for continued treatment.   Interventions: CBT & interpersonal.   Diagnosis:  Adjustment disorder with mixed anxiety and depressed mood  Trauma and stressor-related disorder  Psychiatric Treatment: Yes , by Ronnald Nian, MD   Treatment Plan:  Client Abilities/Strengths Matt is intelligent, self-aware, and expressive.   Support System: Family and friends.   Client Treatment Preferences Outpatient Therapy.   Client Statement of Needs Melbourne would like to process past events, manage interpersonal stressors, manage overall symptoms, process health related issues, recent adjustments including 56 year old daughter who is pregnant and also had a toddler moving into the home, his daughter returning to an abusive relationship, relationship stressors with wife, manage day-to-day stress, improve frustration tolerance, setting and maintaining boundaries with others.    Treatment Level Weekly  Symptoms  Depression: loss of interest, feeling down, lethargy, poor appetite (medical), feeling bad about self, and trouble concentrating.  (Status: maintained) Anxiety: Feeling anxious, difficulty managing worry, worrying about different things, restlessness, irritability, and feeling afraid something awful might happen.    (Status: maintained)  Goals:   Dhruva experiences symptoms of depression and anxiety.   Treatment plan signed and available on s-drive:  Yes    Target Date: 04/06/24 Frequency: Weekly  Progress: 0 Modality: individual    Therapist will provide referrals for additional resources as appropriate.  Therapist will provide psycho-education regarding Harveer's diagnosis and corresponding treatment approaches and  interventions. Licensed Clinical Social Worker, [place], LCSW will support the  patient's ability to achieve the goals identified. will employ CBT, BA, Problem-solving, Solution Focused, Mindfulness,  coping skills, & other evidenced-based practices will be used to promote progress towards healthy functioning to help manage decrease symptoms associated with his diagnosis.   Reduce overall level, frequency, and intensity of the feelings of depression, anxiety and panic evidenced by decreased overall symptoms from 6 to 7 days/week to 0 to 1 days/week per client report for at least 3 consecutive months. Verbally express understanding of the relationship between feelings of depression, anxiety and their impact on thinking patterns and behaviors. Verbalize an understanding of the role that distorted thinking plays in creating fears, excessive worry, and ruminations.  [name] participated in the creation of the treatment plan)   [name], LCSW

## 2023-10-03 LAB — MYCOBACTERIA,CULT W/FLUOROCHROME SMEAR
MICRO NUMBER:: 15865507
SMEAR:: NONE SEEN
SPECIMEN QUALITY:: ADEQUATE

## 2023-10-06 LAB — MYCOBACTERIA,CULT W/FLUOROCHROME SMEAR
MICRO NUMBER:: 15871166
SMEAR:: NONE SEEN
SPECIMEN QUALITY:: ADEQUATE

## 2023-10-09 ENCOUNTER — Other Ambulatory Visit: Payer: Self-pay

## 2023-10-09 ENCOUNTER — Ambulatory Visit (INDEPENDENT_AMBULATORY_CARE_PROVIDER_SITE_OTHER): Payer: 59 | Admitting: Internal Medicine

## 2023-10-09 ENCOUNTER — Telehealth: Payer: Self-pay | Admitting: Pharmacist

## 2023-10-09 ENCOUNTER — Encounter: Payer: Self-pay | Admitting: Internal Medicine

## 2023-10-09 VITALS — BP 118/77 | HR 76 | Resp 16 | Ht 70.0 in | Wt 130.6 lb

## 2023-10-09 DIAGNOSIS — J449 Chronic obstructive pulmonary disease, unspecified: Secondary | ICD-10-CM | POA: Diagnosis not present

## 2023-10-09 DIAGNOSIS — F17210 Nicotine dependence, cigarettes, uncomplicated: Secondary | ICD-10-CM | POA: Diagnosis not present

## 2023-10-09 DIAGNOSIS — A310 Pulmonary mycobacterial infection: Secondary | ICD-10-CM | POA: Diagnosis not present

## 2023-10-09 NOTE — Progress Notes (Signed)
 [facility]  CHIEF COMPLAINT:    Follow up for MAC infection  SUBJECTIVE:    Gerald Fernandez is a 58 y.o. male with PMHx as below who presents to the clinic for MAC infection.    10/09/23 id clinic f/u See a&p for detail   03/20/23 id clinic f/u He was seen last by my partner dr Prentiss 12/2022. He was previously seen at [facility] as well He continues to smoke He has cavitary/nodular process 09/2022 cx negative after persistently positive prior to that.  He has been on therapy since 08/2021 Current regimen azith/ethambutol , clofaz, and inhaled amikacin    --------- He was last seen on 09/18/22 and also saw Dr Florian at [facility] on 06/17/22.  He is currently on a regimen of Azithromycin  500mg  PO daily, Ethambutol  1000mg  PO daily, Clofazimine  100mg  PO daily, and inhaled Amikacin .     His most recent AFB sputum cultures from 09/18/22 were finally negative after persistently positive cultures from time of initial diagnosis in December 2022.  Regarding his symptoms, he reports he is doing well today and no major complaints or symptoms.  He reports he continues to have cut back on his tobacco use.  He is tolerating antibiotics with no issues.  He had a repeat CT chest in January 2024 that overall showed stability with some areas of improvement.    From Dr Jaquita note, I personally reviewed his chest CT films from 06/2021 and 03/2022 with Gerald Fernandez and his wife. Both the larger right upper lobe cavitary lesion and the smaller left upper lobe cavitary lesion have filled in and I can see some calcifications in the center of the right upper lobe lesion. I do not see any middle or lower lobe disease and he does not have a lot of surrounding emphysema. 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 Given that and ongoing tobacco use, I don't think that pursuing surgical resection is a  good idea at this point. I would recommend trying to optimize his medical therapy, working on smoking cessation, and close monitoring.   Please see A&P for the details of today's visit and status of the patient's medical problems.   Patient's Medications  New Prescriptions   No medications on file  Previous Medications   ADVAIR  HFA 115-21 MCG/ACT INHALER    INHALE 2 PUFFS INTO THE LUNGS TWICE A DAY   ALBUTEROL  (VENTOLIN  HFA) 108 (90 BASE) MCG/ACT INHALER    Inhale 2 puffs into the lungs every 6 (six) hours as needed for wheezing or shortness of breath.   AMIKACIN  SULFATE LIPOSOME (ARIKAYCE ) 590 MG/8.4ML SUSP    Inhale 590 mg into the lungs daily.   ATORVASTATIN  (LIPITOR) 10 MG TABLET    Take 1 tablet (10 mg total) by mouth daily.   CLOFAZIMINE  50 MG CAPS CAPSULE (FOR COMPASSIONATE USE)    Take 100 mg by mouth daily with breakfast.   DULOXETINE  (CYMBALTA ) 60 MG CAPSULE    Take 1 capsule (60 mg total) by mouth daily.   ETHAMBUTOL  (MYAMBUTOL ) 100 MG TABLET    Take two 100 mg tablets WITH two 400 mg tablets of ethambutol  together to equal 1000 mg total   ETHAMBUTOL  (MYAMBUTOL ) 400 MG TABLET    Take two 400 mg tablets WITH two 100 mg tablets of ethambutol  together to equal 1000 mg total   IBUPROFEN  (ADVIL ) 800 MG  TABLET    Take 800 mg by mouth in the morning and at bedtime.   MULTIPLE VITAMIN (MULTIVITAMIN WITH MINERALS) TABS TABLET    Take 1 tablet by mouth in the morning. Centrum Gummies   OXYCODONE -ACETAMINOPHEN  (PERCOCET/ROXICET) 5-325 MG TABLET    Take 1 tablet by mouth 3 (three) times daily as needed.   PREGABALIN (LYRICA) 100 MG CAPSULE    Take 100 mg by mouth at bedtime.  Modified Medications   No medications on file  Discontinued Medications   No medications on file      Past Medical History:  Diagnosis Date   Allergy    Arthritis    Depression    situational (mother passing)   DVT (deep venous thrombosis) (HCC) 2010   GERD (gastroesophageal reflux disease)    not at this time    Hx of pulmonary embolus 2014   Impingement syndrome of left ankle    PONV (postoperative nausea and vomiting)    Smoker 12/02/2007   Tobacco use disorder     Social History   Tobacco Use   Smoking status: Some Days    Current packs/day: 0.20    Average packs/day: 0.2 packs/day for 28.0 years (5.6 ttl pk-yrs)    Types: Cigarettes   Smokeless tobacco: Former   Tobacco comments:    Pt reports cutting back to 8 cigarettes or less per day, bnm 10/08/21  Vaping Use   Vaping status: Former  Substance Use Topics   Alcohol use: Yes    Alcohol/week: 2.0 standard drinks of alcohol    Types: 2 Standard drinks or equivalent per week    Comment: 2 times a year   Drug use: No    Family History  Problem Relation Age of Onset   Hypertension Mother    COPD Mother    Aortic dissection Mother 58   Hypertension Father    Cancer Father     Allergies  Allergen Reactions   Penicillins Other (See Comments)    From childhood Did it involve swelling of the face/tongue/throat, SOB, or low BP? Yes Did it involve sudden or severe rash/hives, skin peeling, or any reaction on the inside of your mouth or nose? Unknown Did you need to seek medical attention at a hospital or doctor's office? Unknown When did it last happen? childhood reaction. If all above answers are NO, may proceed with cephalosporin use.     Review of Systems  All other systems reviewed and are negative.  Except as noted above.  OBJECTIVE:    There were no vitals filed for this visit.  There is no height or weight on file to calculate BMI.  Physical Exam Constitutional:      Appearance: Normal appearance.  HENT:     Head: Normocephalic and atraumatic.  Eyes:     Extraocular Movements: Extraocular movements intact.     Conjunctiva/sclera: Conjunctivae normal.  Abdominal:     General: There is no distension.     Palpations: Abdomen is soft.  Musculoskeletal:     Cervical back: Normal range of motion and neck  supple.  Skin:    General: Skin is warm and dry.     Coloration: Skin is not jaundiced.  Neurological:     General: No focal deficit present.     Mental Status: He is alert and oriented to person, place, and time.  Psychiatric:        Mood and Affect: Mood normal.        Behavior: Behavior  normal.      Labs and Microbiology:    Latest Ref Rng & Units 07/10/2023    4:20 PM 03/24/2023   10:17 AM 12/11/2022    4:20 AM  CBC  WBC 3.8 - 10.8 Thousand/uL 7.1  6.0  7.7   Hemoglobin 13.2 - 17.1 g/dL 86.3  86.1  85.2   Hematocrit 38.5 - 50.0 % 40.6  40.6  43.4   Platelets 140 - 400 Thousand/uL 229  214  215       Latest Ref Rng & Units 07/10/2023    4:20 PM 03/24/2023   10:17 AM 12/11/2022    4:20 AM  CMP  Glucose 65 - 99 mg/dL 78  895  74   BUN 7 - 25 mg/dL 11  10  14    Creatinine 0.70 - 1.30 mg/dL 9.34  9.24  9.16   Sodium 135 - 146 mmol/L 142  142  140   Potassium 3.5 - 5.3 mmol/L 3.8  4.2  4.0   Chloride 98 - 110 mmol/L 104  108  103   CO2 20 - 32 mmol/L 30  30  29    Calcium  8.6 - 10.3 mg/dL 8.0  8.4  8.6   Total Protein 6.1 - 8.1 g/dL 6.1  6.1  6.5   Total Bilirubin 0.2 - 1.2 mg/dL 0.5  0.4  0.6   AST 10 - 35 U/L 17  21  26    ALT 9 - 46 U/L 12  16  19        Imaging: Reviewed  08/2023 chest ct Dominant 5.3 cm right apical masslike opacity, similar in overall size but now with central cavitation, compatible with the patient's known atypical mycobacterial infection.   Additional peribronchovascular nodular opacities in the bilateral upper lobes, similar. Progressive nodularity/patchy opacity in the inferior right upper lobe and posterior right middle lobe. This appearance remains compatible with waxing/waning atypical mycobacterial infection.   Consider follow-up CT chest in 1 year.    ASSESSMENT & PLAN:    No problem-specific Assessment & Plan notes found for this encounter.    No orders of the defined types were placed in this encounter.    Nodulocavitary  mac lung Smoker Copd    Abx (discussed with our pharmacist Cassie) 09/2021-c clofazimine  08/2021-c ethambutol  08/2021-c azithromycin  08/2021-c arikayce     08/2021-10/2021 moxifloxacin  08/2021-06/2022 amikacin   Started on azith, moxi, ethambutol , and inhaled amikacin  to be aggressive. susceptibilities came back and it was R to moxi, so stopped that and substituted clofaz. had continued sputum production and + cultures despite 6 months of this treatment so changed to IV amikacin . He saw Florian at [facility] and he said MIC was too high for IV amikacin  to get benefit so advised to switch back to inhaled amikacin  and this is where we are now. Rifampin not used initially due to ddi with his partial opioid agonist     Sputums 3 sets Blood tests Stop smoking See me in 3 months   ---------- 07/10/2023 id assessment Still smokes -- advise stopping smoking No sx change since on MAC treatment (2 miles biking; walk flat ground 2 miles -- [place] trouble); no b symptoms. Good appetite No abx side effect Yet to give sputum Due for repeat chest ct   Discussed he has been on mac tx for 2 years and no change in symptoms. We discussed potentially if ct stable/improved and despite sputum inability to clear we might be able to stop antibiotics because utlimately goals is  quality of life and also because high risk of mac recolonization/reinfection   F/u 3 months Labs today Chest ct repeat   10/09/23 id clinic assessment Patient felt clinically same as last visit He hasn't seen pulmonology for follow up --- baseline doesn't get short of breath unless he walks 4 flight of stairs and he does all heavy chores ok Still smokes  Ct from 08/2023 reviewed for most part stable but rul apical lesion cavitary now -- will defer to pulm if feels need to biopsy to look for cancer given smoking hx (no b sx)  Minimal intermittent cough; no weight loss; appetite is good  08/21/23 sputum still  growing afb  Given cavitary change and ongoing positive sample sputum, will consider adding omadocycline to regimen   Will need to get susceptibility  3 more samples sputum ordered  Cbc, cmp  F/u 8 weeks    Constance ONEIDA Overton Altamease [facility] for Infectious Disease [facility] 10/09/2023, 3:56 PM

## 2023-10-09 NOTE — Telephone Encounter (Signed)
 Patient picked up 2 bottles of clofazimine  today. Supply should last for ~3 months. Next refill due around the beginning/middle of May.  Octavius Shin L. Ysabelle Goodroe, PharmD, BCIDP, AAHIVP, CPP Clinical Pharmacist Practitioner Infectious Diseases Clinical Pharmacist [facility] 10/09/2023, 4:33 PM

## 2023-10-09 NOTE — Patient Instructions (Signed)
 Please give 3 more sputum    Will need to test sensitivity to omadocycline; and  if it works will add to regimen   Please see pulmonology again and get repeat pulmonary function test as well as discuss need to see if any cancer screening needed    See me in 6-8 weeks

## 2023-10-10 ENCOUNTER — Other Ambulatory Visit (HOSPITAL_COMMUNITY): Payer: Self-pay

## 2023-10-10 LAB — CBC WITH DIFFERENTIAL/PLATELET
Absolute Lymphocytes: 2604 {cells}/uL (ref 850–3900)
Absolute Monocytes: 876 {cells}/uL (ref 200–950)
Basophils Absolute: 42 {cells}/uL (ref 0–200)
Basophils Relative: 0.7 %
Eosinophils Absolute: 420 {cells}/uL (ref 15–500)
Eosinophils Relative: 7 %
HCT: 45.6 % (ref 38.5–50.0)
Hemoglobin: 15.5 g/dL (ref 13.2–17.1)
MCH: 32 pg (ref 27.0–33.0)
MCHC: 34 g/dL (ref 32.0–36.0)
MCV: 94.2 fL (ref 80.0–100.0)
MPV: 10.2 fL (ref 7.5–12.5)
Monocytes Relative: 14.6 %
Neutro Abs: 2058 {cells}/uL (ref 1500–7800)
Neutrophils Relative %: 34.3 %
Platelets: 233 10*3/uL (ref 140–400)
RBC: 4.84 10*6/uL (ref 4.20–5.80)
RDW: 12 % (ref 11.0–15.0)
Total Lymphocyte: 43.4 %
WBC: 6 10*3/uL (ref 3.8–10.8)

## 2023-10-10 LAB — COMPLETE METABOLIC PANEL WITH GFR
AG Ratio: 1.5 (calc) (ref 1.0–2.5)
ALT: 13 U/L (ref 9–46)
AST: 18 U/L (ref 10–35)
Albumin: 3.8 g/dL (ref 3.6–5.1)
Alkaline phosphatase (APISO): 38 U/L (ref 35–144)
BUN/Creatinine Ratio: 13 (calc) (ref 6–22)
BUN: 8 mg/dL (ref 7–25)
CO2: 29 mmol/L (ref 20–32)
Calcium: 8.7 mg/dL (ref 8.6–10.3)
Chloride: 104 mmol/L (ref 98–110)
Creat: 0.63 mg/dL — ABNORMAL LOW (ref 0.70–1.30)
Globulin: 2.5 g/dL (ref 1.9–3.7)
Glucose, Bld: 82 mg/dL (ref 65–99)
Potassium: 3.7 mmol/L (ref 3.5–5.3)
Sodium: 140 mmol/L (ref 135–146)
Total Bilirubin: 0.4 mg/dL (ref 0.2–1.2)
Total Protein: 6.3 g/dL (ref 6.1–8.1)
eGFR: 112 mL/min/{1.73_m2} (ref >=60)

## 2023-10-10 NOTE — Progress Notes (Signed)
 That's bad for the patient's prognosis  Thanks Cassie

## 2023-10-13 ENCOUNTER — Telehealth: Payer: Self-pay

## 2023-10-13 ENCOUNTER — Telehealth: Payer: Self-pay | Admitting: Pulmonary Disease

## 2023-10-13 DIAGNOSIS — A310 Pulmonary mycobacterial infection: Secondary | ICD-10-CM

## 2023-10-13 NOTE — Telephone Encounter (Signed)
 Patient overdue for follow-up.  Gerald Fernandez please contact to schedule PFTs and schedule follow-up with me. Ok for separate appointments if needed

## 2023-10-13 NOTE — Telephone Encounter (Signed)
-----   Message from Frederick sent at 10/09/2023  4:23 PM EST ----- Valdemar Garre He still smokes Not able to clear sputum of mac  I am testing omadocycline and will see if we have cultures to do it  Would you be able to assess his objective functional status ?repeat spirometry  Also do you want to do anything else with lung cancer screening  thanks

## 2023-10-13 NOTE — Telephone Encounter (Signed)
 Received refill request for ethambutol .   Spoke with Dr. Shereen Dike to clarify regimen. Per Dr. Shereen Dike, patient should be on "azith, clofaz, ethambutol , and inhaled amikacin  (arikace)"  No azithromycin  prescription on file and amikacin  prescription is outdated. Patient is picking up clofaz from the clinic. Will reach out to patient to ensure he has full regimen.   Per Dr. Shereen Dike, patient also needs to arrange annual eye exam.   Nivia Basta, RN

## 2023-10-14 ENCOUNTER — Ambulatory Visit: Payer: Self-pay | Admitting: Psychology

## 2023-10-15 ENCOUNTER — Encounter (HOSPITAL_BASED_OUTPATIENT_CLINIC_OR_DEPARTMENT_OTHER): Payer: 59

## 2023-10-19 LAB — MYCOBACTERIA,CULT W/FLUOROCHROME SMEAR
MICRO NUMBER:: 15876393
SPECIMEN QUALITY:: ADEQUATE

## 2023-10-20 ENCOUNTER — Ambulatory Visit (INDEPENDENT_AMBULATORY_CARE_PROVIDER_SITE_OTHER): Payer: 59 | Admitting: Psychology

## 2023-10-20 DIAGNOSIS — F439 Reaction to severe stress, unspecified: Secondary | ICD-10-CM | POA: Diagnosis not present

## 2023-10-20 DIAGNOSIS — F4323 Adjustment disorder with mixed anxiety and depressed mood: Secondary | ICD-10-CM | POA: Diagnosis not present

## 2023-10-20 MED ORDER — AZITHROMYCIN 500 MG PO TABS: 500.0000 mg | ORAL_TABLET | Freq: Every day | ORAL | 11 refills | Status: AC

## 2023-10-20 MED ORDER — ETHAMBUTOL HCL 400 MG PO TABS: 1000.0000 mg | ORAL_TABLET | Freq: Every day | ORAL | 11 refills | Status: AC

## 2023-10-20 MED ORDER — ARIKAYCE 590 MG/8.4ML IN SUSP: 590.0000 mg | Freq: Every day | RESPIRATORY_TRACT | Status: DC

## 2023-10-20 NOTE — Progress Notes (Signed)
[facility] Counselor/Therapist Progress Note  Patient ID: Gerald Fernandez, MRN: [id number]   Date: 10/20/23  Time Spent: 4:02 pm - 5:00 pm : 58 Minutes  Treatment Type: Individual Therapy.  Reported Symptoms: depression  Mental Status Exam: Appearance:  Casual     Behavior: Appropriate  Motor: Normal  Speech/Language:  Normal Rate  Affect: Congruent  Mood: dysthymic  Thought process: normal  Thought content:   WNL  Sensory/Perceptual disturbances:   WNL  Orientation: oriented to person, place, time/date, and situation  Attention: Good  Concentration: Good  Memory: WNL  Fund of knowledge:  Good  Insight:   Good  Judgment:  Good  Impulse Control: Good   Risk Assessment: Danger to Self:  No Self-injurious Behavior: No Danger to Others: No Duty to Warn:no Physical Aggression / Violence:No  Access to Firearms a concern: No  Gang Involvement:No   Subjective:   Gerald Fernandez participated from from the office, with the therapist, and consented to treatment.  Gerald Fernandez provided feedback regarding the past week.He noted recent testing for his lung due to his MAC diagnosis and noted developing a node in his right lung which will require a biopsy. He noted that interpersonal stressors have not improved his health or level of stress. He noted needing to focus more on caring for self and his own health.  He noted his wife including his family into their relationship and noted this creating strain between him and his family. We explored this during the session and the effect of this on his mood. He noted that his mother was a Museum/gallery curator and noted that he has been placed in this position and noted the stressors of this. We discussed the importance of boundaries for self and others. Therapist modeled this during the session and ways to manage his self-talk, going forward. Gerald Fernandez was engaged and motivated during the session and expressed commitment towards goals. Therapist  praised Gerald Fernandez for his effort and energy during the session. Therapist praised Gerald Fernandez for his effort and energy and provided supportive therapy.   Interventions: CBT & interpersonal.   Diagnosis:  Adjustment disorder with mixed anxiety and depressed mood  Trauma and stressor-related disorder  Psychiatric Treatment: Yes , by Ronnald Nian, MD   Treatment Plan:  Client Abilities/Strengths Gerald Fernandez is intelligent, self-aware, and expressive.   Support System: Family and friends.   Client Treatment Preferences Outpatient Therapy.   Client Statement of Needs Crit would like to process past events, manage interpersonal stressors, manage overall symptoms, process health related issues, recent adjustments including 1 year old daughter who is pregnant and also had a toddler moving into the home, his daughter returning to an abusive relationship, relationship stressors with wife, manage day-to-day stress, improve frustration tolerance, setting and maintaining boundaries with others.    Treatment Level Weekly  Symptoms  Depression: loss of interest, feeling down, lethargy, poor appetite (medical), feeling bad about self, and trouble concentrating.  (Status: maintained) Anxiety: Feeling anxious, difficulty managing worry, worrying about different things, restlessness, irritability, and feeling afraid something awful might happen.    (Status: maintained)  Goals:   Gerald Fernandez experiences symptoms of depression and anxiety.   Treatment plan signed and available on s-drive:  Yes    Target Date: 04/06/24 Frequency: Weekly  Progress: 0 Modality: individual    Therapist will provide referrals for additional resources as appropriate.  Therapist will provide psycho-education regarding Gerald Fernandez's diagnosis and corresponding treatment approaches and interventions. Licensed Clinical Social Worker, [facility], LCSW will  support the patient's ability to achieve the goals identified. will  employ CBT, BA, Problem-solving, Solution Focused, Mindfulness,  coping skills, & other evidenced-based practices will be used to promote progress towards healthy functioning to help manage decrease symptoms associated with his diagnosis.   Reduce overall level, frequency, and intensity of the feelings of depression, anxiety and panic evidenced by decreased overall symptoms from 6 to 7 days/week to 0 to 1 days/week per client report for at least 3 consecutive months. Verbally express understanding of the relationship between feelings of depression, anxiety and their impact on thinking patterns and behaviors. Verbalize an understanding of the role that distorted thinking plays in creating fears, excessive worry, and ruminations.  Gerald Fernandez participated in the creation of the treatment plan)   [name], LCSW

## 2023-10-20 NOTE — Telephone Encounter (Signed)
I spoke with [facility] this morning, and they stated Gerald Fernandez last filled Arikayce with them on 07/02/23. They have reached out to him several times since then for refills without success. Reviewed contact information and provided them with his home number. They stated they sent [place] a fax about his adherence concerns in December. They will try to reach him today about refills. He does have 11 refills left with his script though. So, Dr. Renold Don, it sounds like he has not been as consistent with taking azithromycin or Arikayce. Mainly consistent with clofazimine and ethambutol.   Margarite Gouge, PharmD, CPP, BCIDP, AAHIVP Clinical Pharmacist Practitioner Infectious Diseases Clinical Pharmacist [facility] for Infect[facility] Disease

## 2023-10-20 NOTE — Addendum Note (Signed)
Addended by: Jennette Kettle on: 10/20/2023 09:51 AM   Modules accepted: Orders

## 2023-10-20 NOTE — Telephone Encounter (Signed)
No problem - resending these to his preferred pharmacies now.   Dr. Renold Don, I did review his fill history since his recent cultures resulted positive again. Looks like he last filled azithromycin at the end of November, so he probably has not been taking that for ~2 months. I'm hoping the dispense report is incorrect/missing data because it says he last filled Arikayce in June 2023. Hopefully, that is outdated somehow. I do have a note in October 2024 stating we received a faxed refill request which we signed for one year of refills. So that to me shows he should have updated refills with [facility] for his Arikayce - even though the e-script hasn't been updated. The fact that they needed a new prescription means he probably is getting it appropriately. He has been filling ethambutol on time, so that's good. And we know per Cassie he has been filling his clofazimine on time.

## 2023-10-28 ENCOUNTER — Other Ambulatory Visit (HOSPITAL_COMMUNITY): Payer: Self-pay

## 2023-10-28 ENCOUNTER — Encounter: Payer: Self-pay | Admitting: Internal Medicine

## 2023-10-28 ENCOUNTER — Telehealth: Payer: Self-pay

## 2023-10-28 NOTE — Telephone Encounter (Signed)
 [facility] Patient Advocate Encounter   Received notification from [facility] that prior authorization for Arikayce is required.   PA submitted on 10/28/23 Key [id number] Status is pending    [facility] will continue to follow.   Clearance Coots, CPhT Specialty Pharmacy Patient [facility] for Infectious Disease Phone: [phone] Fax:  [phone]

## 2023-11-03 ENCOUNTER — Other Ambulatory Visit (HOSPITAL_COMMUNITY): Payer: Self-pay

## 2023-11-03 ENCOUNTER — Telehealth: Payer: Self-pay

## 2023-11-03 NOTE — Telephone Encounter (Signed)
 Pt scheduled for PFT 3/19 at 8:30. PFT moved to 8:00 to allow a 1hr PFT work in.  Lm for patient to make him aware.

## 2023-11-04 ENCOUNTER — Ambulatory Visit: Payer: 59 | Admitting: Psychology

## 2023-11-05 NOTE — Telephone Encounter (Signed)
 Pt is aware of below message. He is okay with time change.  Nothing further needed.

## 2023-11-06 ENCOUNTER — Telehealth: Payer: Self-pay

## 2023-11-06 ENCOUNTER — Other Ambulatory Visit (HOSPITAL_COMMUNITY): Payer: Self-pay

## 2023-11-06 DIAGNOSIS — A310 Pulmonary mycobacterial infection: Secondary | ICD-10-CM

## 2023-11-06 NOTE — Telephone Encounter (Signed)
 Received call from [facility] stating patient now has a restricted provider and needs to use [facility] [phone]). [facility] will try and transfer the Rx, but wanted to make provider aware of change.   Sandie Ano, RN

## 2023-11-06 NOTE — Telephone Encounter (Addendum)
 [facility] Patient Advocate Encounter  Prior Authorization for Gerald Fernandez has been approved.    PA# [id number] Effective dates: 10/31/23 through 04/28/24  Script can be sent to [facility].   [facility] will continue to follow.  Clearance Coots, CPhT Specialty Pharmacy Patient [facility] for Infectious Disease Phone: [phone] Fax:  [phone]

## 2023-11-07 MED ORDER — ARIKAYCE 590 MG/8.4ML IN SUSP: 590.0000 mg | Freq: Every day | RESPIRATORY_TRACT | 11 refills | Status: DC

## 2023-11-07 NOTE — Telephone Encounter (Signed)
 Thanks for the update Gerald Fernandez - I will go ahead and send a new prescription there in case there was an issue with transferring.

## 2023-11-07 NOTE — Addendum Note (Signed)
 Addended by: Jennette Kettle on: 11/07/2023 12:04 PM   Modules accepted: Orders

## 2023-11-12 ENCOUNTER — Ambulatory Visit (HOSPITAL_BASED_OUTPATIENT_CLINIC_OR_DEPARTMENT_OTHER): Payer: 59 | Admitting: Pulmonary Disease

## 2023-11-14 ENCOUNTER — Other Ambulatory Visit (HOSPITAL_BASED_OUTPATIENT_CLINIC_OR_DEPARTMENT_OTHER): Payer: Self-pay

## 2023-11-14 DIAGNOSIS — A310 Pulmonary mycobacterial infection: Secondary | ICD-10-CM

## 2023-11-17 ENCOUNTER — Ambulatory Visit: Admitting: Psychology

## 2023-11-17 ENCOUNTER — Ambulatory Visit: Payer: 59 | Admitting: Internal Medicine

## 2023-11-17 DIAGNOSIS — F4323 Adjustment disorder with mixed anxiety and depressed mood: Secondary | ICD-10-CM | POA: Diagnosis not present

## 2023-11-17 DIAGNOSIS — F439 Reaction to severe stress, unspecified: Secondary | ICD-10-CM | POA: Diagnosis not present
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
## 2023-11-19 ENCOUNTER — Ambulatory Visit (HOSPITAL_BASED_OUTPATIENT_CLINIC_OR_DEPARTMENT_OTHER): Payer: 59 | Admitting: Pulmonary Disease

## 2023-11-19 ENCOUNTER — Other Ambulatory Visit: Payer: Self-pay

## 2023-11-19 ENCOUNTER — Encounter: Payer: Self-pay | Admitting: Internal Medicine

## 2023-11-19 ENCOUNTER — Ambulatory Visit: Admitting: Internal Medicine

## 2023-11-19 ENCOUNTER — Encounter (HOSPITAL_BASED_OUTPATIENT_CLINIC_OR_DEPARTMENT_OTHER): Payer: Self-pay | Admitting: Pulmonary Disease

## 2023-11-19 ENCOUNTER — Ambulatory Visit (HOSPITAL_BASED_OUTPATIENT_CLINIC_OR_DEPARTMENT_OTHER): Payer: 59

## 2023-11-19 ENCOUNTER — Ambulatory Visit: Payer: 59 | Admitting: Internal Medicine

## 2023-11-19 VITALS — BP 119/77 | HR 73 | Temp 97.2°F | Ht 69.0 in | Wt 134.0 lb

## 2023-11-19 VITALS — BP 110/62 | HR 85 | Ht 69.0 in | Wt 135.2 lb

## 2023-11-19 DIAGNOSIS — A310 Pulmonary mycobacterial infection: Secondary | ICD-10-CM

## 2023-11-19 LAB — PULMONARY FUNCTION TEST
DL/VA % pred: 86 %
DL/VA: 3.78 ml/min/mmHg/L
DLCO cor % pred: 85 %
DLCO cor: 23.5 ml/min/mmHg
DLCO unc % pred: 87 %
DLCO unc: 24.08 ml/min/mmHg
FEF 25-75 Post: 2.09 L/s
FEF 25-75 Pre: 2.7 L/s
FEF2575-%Change-Post: -22 %
FEF2575-%Pred-Post: 66 %
FEF2575-%Pred-Pre: 86 %
FEV1-%Change-Post: -24 %
FEV1-%Pred-Post: 76 %
FEV1-%Pred-Pre: 101 %
FEV1-Post: 2.79 L
FEV1-Pre: 3.7 L
FEV1FVC-%Change-Post: -26 %
FEV1FVC-%Pred-Pre: 95 %
FEV6-%Change-Post: 1 %
FEV6-%Pred-Post: 112 %
FEV6-%Pred-Pre: 109 %
FEV6-Post: 5.11 L
FEV6-Pre: 5.01 L
FEV6FVC-%Change-Post: -1 %
FEV6FVC-%Pred-Post: 103 %
FEV6FVC-%Pred-Pre: 104 %
FVC-%Change-Post: 2 %
FVC-%Pred-Post: 108 %
FVC-%Pred-Pre: 106 %
FVC-Post: 5.17 L
FVC-Pre: 5.05 L
Post FEV1/FVC ratio: 54 %
Post FEV6/FVC ratio: 99 %
Pre FEV1/FVC ratio: 73 %
Pre FEV6/FVC Ratio: 100 %
RV % pred: 173 %
RV: 3.61 L
TLC % pred: 95 %
TLC: 6.45 L

## 2023-11-19 NOTE — Patient Instructions (Signed)
 Full PFT Performed Today

## 2023-11-19 NOTE — Patient Instructions (Signed)
 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 Reviewed risks and benefits of procedure. Will discuss with ID to determine plan

## 2023-11-19 NOTE — Progress Notes (Signed)
 Full PFT Performed Today

## 2023-11-19 NOTE — Patient Instructions (Signed)
 So far no culture is growing but they did see acid fast bacteria   Will continue current medication. Make sure you take all Arikayce Azithromycin Ethambutol Clofazimine     See me in 3 months and plan to get more sputum culture at that time
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
## 2023-11-19 NOTE — Progress Notes (Signed)
 [facility]  CHIEF COMPLAINT:    Follow up for MAC infection  SUBJECTIVE:    Gerald Fernandez is a 58 y.o. male with PMHx as below who presents to the clinic for MAC infection.    11/19/23 See a&p for detail   03/20/23 id clinic f/u He was seen last by my partner dr Earlene Plater 12/2022. He was previously seen at [facility] as well He continues to smoke He has cavitary/nodular process 09/2022 cx negative after persistently positive prior to that.  He has been on therapy since 08/2021 Current regimen azith/ethambutol, clofaz, and inhaled amikacin   --------- He was last seen on 09/18/22 and also saw Dr Valentina Lucks at [facility] on 06/17/22.  He is currently on a regimen of Azithromycin 500mg  PO daily, Ethambutol 1000mg  PO daily, Clofazimine 100mg  PO daily, and inhaled Amikacin.     His most recent AFB sputum cultures from 09/18/22 were finally negative after persistently positive cultures from time of initial diagnosis in December 2022.  Regarding his symptoms, he reports he is doing well today and no major complaints or symptoms.  He reports he continues to have cut back on his tobacco use.  He is tolerating antibiotics with no issues.  He had a repeat CT chest in January 2024 that overall showed stability with some areas of improvement.    From Dr Maretta Los note, "I personally reviewed his chest CT films from 06/2021 and 03/2022 with Mr. Kuzniar and his wife. Both the larger right upper lobe cavitary lesion and the smaller left upper lobe cavitary lesion have filled in and I can see some calcifications in the center of the right upper lobe lesion. I do not see any middle or lower lobe disease and he does not have a lot of surrounding emphysema. 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 Given that and ongoing tobacco use, I don't think that pursuing surgical resection is a good idea at  this point. I would recommend trying to optimize his medical therapy, working on smoking cessation, and close monitoring. "  Please see A&P for the details of today's visit and status of the patient's medical problems.   Patient's Medications  New Prescriptions   No medications on file  Previous Medications   ADVAIR HFA 115-21 MCG/ACT INHALER    INHALE 2 PUFFS INTO THE LUNGS TWICE A DAY   ALBUTEROL (VENTOLIN HFA) 108 (90 BASE) MCG/ACT INHALER    Inhale 2 puffs into the lungs every 6 (six) hours as needed for wheezing or shortness of breath.   AMIKACIN SULFATE LIPOSOME (ARIKAYCE) 590 MG/8.4ML SUSP    Inhale 590 mg into the lungs daily.   ATORVASTATIN (LIPITOR) 10 MG TABLET    Take 1 tablet (10 mg total) by mouth daily.   AZITHROMYCIN (ZITHROMAX) 500 MG TABLET    Take 1 tablet (500 mg total) by mouth daily.   CLOFAZIMINE 50 MG CAPS CAPSULE (FOR COMPASSIONATE USE)    Take 100 mg by mouth daily with breakfast.   DULOXETINE (CYMBALTA) 60 MG CAPSULE    Take 1 capsule (60 mg total) by mouth daily.   ETHAMBUTOL (MYAMBUTOL) 400 MG TABLET    Take 2.5 tablets (1,000 mg total) by mouth daily.   IBUPROFEN (ADVIL) 800 MG TABLET    Take 800 mg by mouth in the morning and at bedtime.   MULTIPLE VITAMIN (MULTIVITAMIN WITH  MINERALS) TABS TABLET    Take 1 tablet by mouth in the morning. Centrum Gummies   OXYCODONE-ACETAMINOPHEN (PERCOCET/ROXICET) 5-325 MG TABLET    Take 1 tablet by mouth 3 (three) times daily as needed.   PREGABALIN (LYRICA) 100 MG CAPSULE    Take 100 mg by mouth at bedtime.  Modified Medications   No medications on file  Discontinued Medications   No medications on file      Past Medical History:  Diagnosis Date   Allergy    Arthritis    Depression    situational (mother passing)   DVT (deep venous thrombosis) (HCC) 2010   GERD (gastroesophageal reflux disease)    not at this time   Hx of pulmonary embolus 2014   Impingement syndrome of left ankle    PONV (postoperative nausea and  vomiting)    Smoker 12/02/2007   Tobacco use disorder     Social History   Tobacco Use   Smoking status: Some Days    Current packs/day: 0.20    Average packs/day: 0.2 packs/day for 28.0 years (5.6 ttl pk-yrs)    Types: Cigarettes   Smokeless tobacco: Former   Tobacco comments:    Pt reports cutting back to 8 cigarettes or less per day, bnm 10/08/21  Vaping Use   Vaping status: Former  Substance Use Topics   Alcohol use: Yes    Alcohol/week: 2.0 standard drinks of alcohol    Types: 2 Standard drinks or equivalent per week    Comment: 2 times a year   Drug use: No    Family History  Problem Relation Age of Onset   Hypertension Mother    COPD Mother    Aortic dissection Mother 48   Hypertension Father    Cancer Father     Allergies  Allergen Reactions   Penicillins Other (See Comments)    From childhood Did it involve swelling of the face/tongue/throat, SOB, or low BP? Yes Did it involve sudden or severe rash/hives, skin peeling, or any reaction on the inside of your mouth or nose? Unknown Did you need to seek medical attention at a hospital or doctor's office? Unknown When did it last happen? childhood reaction. If all above answers are "NO", may proceed with cephalosporin use.     Review of Systems  All other systems reviewed and are negative.  Except as noted above.  OBJECTIVE:    Vitals:   11/19/23 1115  BP: 119/77  Pulse: 73  Temp: (!) 97.2 F (36.2 C)  TempSrc: Temporal  SpO2: 99%  Weight: 134 lb (60.8 kg)  Height: 5\' 9"  (1.753 m)    Body mass index is 19.79 kg/m.  Physical Exam Constitutional:      Appearance: Normal appearance.  HENT:     Head: Normocephalic and atraumatic.  Eyes:     Extraocular Movements: Extraocular movements intact.     Conjunctiva/sclera: Conjunctivae normal.  Abdominal:     General: There is no distension.     Palpations: Abdomen is soft.  Musculoskeletal:     Cervical back: Normal range of motion and neck  supple.  Skin:    General: Skin is warm and dry.     Coloration: Skin is not jaundiced.  Neurological:     General: No focal deficit present.     Mental Status: He is alert and oriented to person, place, and time.  Psychiatric:        Mood and Affect: Mood normal.  Behavior: Behavior normal.      Labs and Microbiology:    Latest Ref Rng & Units 10/09/2023    4:42 PM 07/10/2023    4:20 PM 03/24/2023   10:17 AM  CBC  WBC 3.8 - 10.8 Thousand/uL 6.0  7.1  6.0   Hemoglobin 13.2 - 17.1 g/dL 29.5  62.1  30.8   Hematocrit 38.5 - 50.0 % 45.6  40.6  40.6   Platelets 140 - 400 Thousand/uL 233  229  214       Latest Ref Rng & Units 10/09/2023    4:42 PM 07/10/2023    4:20 PM 03/24/2023   10:17 AM  CMP  Glucose 65 - 99 mg/dL 82  78  657   BUN 7 - 25 mg/dL 8  11  10    Creatinine 0.70 - 1.30 mg/dL 8.46  9.62  9.52   Sodium 135 - 146 mmol/L 140  142  142   Potassium 3.5 - 5.3 mmol/L 3.7  3.8  4.2   Chloride 98 - 110 mmol/L 104  104  108   CO2 20 - 32 mmol/L 29  30  30    Calcium 8.6 - 10.3 mg/dL 8.7  8.0  8.4   Total Protein 6.1 - 8.1 g/dL 6.3  6.1  6.1   Total Bilirubin 0.2 - 1.2 mg/dL 0.4  0.5  0.4   AST 10 - 35 U/L 18  17  21    ALT 9 - 46 U/L 13  12  16        Imaging: Reviewed  08/2023 chest ct Dominant 5.3 cm right apical masslike opacity, similar in overall size but now with central cavitation, compatible with the patient's known atypical mycobacterial infection.   Additional peribronchovascular nodular opacities in the bilateral upper lobes, similar. Progressive nodularity/patchy opacity in the inferior right upper lobe and posterior right middle lobe. This appearance remains compatible with waxing/waning atypical mycobacterial infection.   Consider follow-up CT chest in 1 year.    ASSESSMENT & PLAN:    No problem-specific Assessment & Plan notes found for this encounter.    No orders of the defined types were placed in this encounter.    Nodulocavitary mac  lung Smoker Copd    Abx (discussed with our pharmacist Cassie) 09/2021-c clofazimine 08/2021-c ethambutol 08/2021-c azithromycin 08/2021-c arikayce    08/2021-10/2021 moxifloxacin 08/2021-06/2022 amikacin  "Started on azith, moxi, ethambutol, and inhaled amikacin to be aggressive. susceptibilities came back and it was R to moxi, so stopped that and substituted clofaz. had continued sputum production and + cultures despite 6 months of this treatment so changed to IV amikacin. He saw Valentina Lucks at [facility] and he said MIC was too high for IV amikacin to get benefit so advised to switch back to inhaled amikacin and this is where we are now. Rifampin not used initially due to ddi with his partial opioid agonist"     Sputums 3 sets Blood tests Stop smoking See me in 3 months   ---------- 07/10/2023 id assessment Still smokes -- advise stopping smoking No sx change since on MAC treatment (2 miles biking; walk flat ground 2 miles -- [place] trouble); no b symptoms. Good appetite No abx side effect Yet to give sputum Due for repeat chest ct   Discussed he has been on mac tx for 2 years and no change in symptoms. We discussed potentially if ct stable/improved and despite sputum inability to clear we might be able to stop antibiotics because utlimately  goals is quality of life and also because high risk of mac recolonization/reinfection   F/u 3 months Labs today Chest ct repeat   10/09/23 id clinic assessment Patient felt clinically same as last visit He hasn't seen pulmonology for follow up --- baseline doesn't get short of breath unless he walks 4 flight of stairs and he does all heavy chores ok Still smokes  Ct from 08/2023 reviewed for most part stable but rul apical lesion cavitary now -- will defer to pulm if feels need to biopsy to look for cancer given smoking hx (no b sx)  Minimal intermittent cough; no weight loss; appetite is good  08/21/23 sputum still growing  afb  Given cavitary change and ongoing positive sample sputum, will consider adding omadocycline to regimen   Will need to get susceptibility  3 more samples sputum ordered  Cbc, cmp  F/u 8 weeks   11/19/23 id clinic assessment I discussed with our pharmacy team. There seems to be some confusion from patient in terms of taking all the medications for his ntm I discuss need to take clofaz, azith, ethambutol, and arikayce  Dr Everardo All Chi called me today and relayed that his pft has been stable. She does want to repeat chest ct in 6 months. We discussed that if his ct is worse and the ntm is suppressed, will likely needs to look for other organism causing lung disease  He doesn't feel any different in terms of respiratory sx. No fever, chill, worsening cough, worsening dyspnea  His 08/2023 cultures showed afb but no growth on culture   F/u 3 months and will get more culture     [facility] 11/19/2023, 11:28 AM

## 2023-11-27 ENCOUNTER — Encounter: Payer: Self-pay | Admitting: Family Medicine

## 2023-11-27 ENCOUNTER — Ambulatory Visit (INDEPENDENT_AMBULATORY_CARE_PROVIDER_SITE_OTHER): Admitting: Family Medicine

## 2023-11-27 VITALS — BP 112/72 | HR 85 | Wt 134.8 lb

## 2023-11-27 DIAGNOSIS — Z716 Tobacco abuse counseling: Secondary | ICD-10-CM

## 2023-11-27 DIAGNOSIS — N5201 Erectile dysfunction due to arterial insufficiency: Secondary | ICD-10-CM | POA: Diagnosis not present

## 2023-11-27 MED ORDER — TADALAFIL 20 MG PO TABS: 20.0000 mg | ORAL_TABLET | Freq: Every day | ORAL | 5 refills | Status: AC | PRN

## 2023-11-27 NOTE — Patient Instructions (Signed)

## 2023-11-27 NOTE — Progress Notes (Signed)
   Subjective:    Patient ID: Gerald Fernandez, male    DOB: 08-31-1966, 58 y.o.   MRN: [id number]  HPI Here for consult concerning 2 issues.  He would like to quit smoking.  He is now down to half a pack per day.  He has quit in the past but started smoking again mainly due to stress related issues. He also notes that over the last year he has had increasing difficulty with erectile dysfunction.  He has not changed any of his medications.  He smokes as mentioned above.  His medications were reviewed.  He and his wife are trying to reconcile differences.   Review of Systems     Objective:    Physical Exam        Assessment & Plan:  Erectile dysfunction due to arterial insufficiency - Plan: tadalafil (CIALIS) 20 MG tablet  Encounter for smoking cessation counseling I discussed the erectile dysfunction issue in regard to his medications, none of which we can really change at the present time.  We will keep working on smoking cessation which I am sure plays a role in this.  Discussed the use of Cialis possible side effects and proper use of this. I also discussed smoking cessation it with him in regard to when where and why, I explained that since he is at a half a pack per day there is really very little likelihood that this is true physiologic but more habit .keeping a record of it and actually writing it down and then coming up with a game plan of what to do instead of smoking for the same time frames.  He will do this for the next month and then return here for further consult concerning that.  He was comfortable with that.

## 2023-12-05 ENCOUNTER — Ambulatory Visit: Admitting: Psychology

## 2023-12-07 ENCOUNTER — Other Ambulatory Visit (HOSPITAL_BASED_OUTPATIENT_CLINIC_OR_DEPARTMENT_OTHER): Payer: Self-pay | Admitting: Pulmonary Disease

## 2023-12-23 ENCOUNTER — Ambulatory Visit (INDEPENDENT_AMBULATORY_CARE_PROVIDER_SITE_OTHER): Admitting: Psychology

## 2023-12-23 DIAGNOSIS — F4323 Adjustment disorder with mixed anxiety and depressed mood: Secondary | ICD-10-CM

## 2023-12-23 NOTE — Progress Notes (Signed)
 [facility] Counselor/Therapist Progress Note  Patient ID: Gerald Fernandez, MRN: [id number]   Date: 12/23/23  Time Spent: 4:03 pm - 4:59 pm : 56 Minutes  Treatment Type: Individual Therapy.  Reported Symptoms: depression  Mental Status Exam: Appearance:  Casual     Behavior: Appropriate  Motor: Normal  Speech/Language:  Normal Rate  Affect: Congruent  Mood: dysthymic  Thought process: normal  Thought content:   WNL  Sensory/Perceptual disturbances:   WNL  Orientation: oriented to person, place, time/date, and situation  Attention: Good  Concentration: Good  Memory: WNL  Fund of knowledge:  Good  Insight:   Good  Judgment:  Good  Impulse Control: Good   Risk Assessment: Danger to Self:  No Self-injurious Behavior: No Danger to Others: No Duty to Warn:no Physical Aggression / Violence:No  Access to Firearms a concern: No  Gang Involvement:No   Subjective:   Gerald Fernandez participated from his home, via video, and consented to treatment. Therapist participated from office. Rose provided feedback regarding the past week. Gerald Fernandez noted recently discovered being "tracked" by his family via a vehicle mounted GPS. He noted this is being "weaponized". He provided feedback regarding his family's behavior. He noted often that his daughter is often "manipulative". He noted feeling both a sense of distress and frustration regarding these conversations and noted difficulty managing these interactions. We explored this during the session and Gerald Fernandez provided in-depth background regarding recent interactions. We reviewed the use of boundaries, assertive communication, and redirection. Therapist modeled this during the session. We worked on feeling identification and distress management through taking breaks and developing a coping card to use during said conversations. Therapist discussed the importance of consistency. Therapist validated Gerald Fernandez's feelings and experience  and provided supportive therapy. A follow-up was scheduled for continued treatment, which he benefits from.   Interventions: CBT & interpersonal.   Diagnosis:  Adjustment disorder with mixed anxiety and depressed mood  Psychiatric Treatment: Yes , by Watson Hacking, MD   Treatment Plan:  Client Abilities/Strengths Gerald Fernandez is intelligent, self-aware, and expressive.   Support System: Family and friends.   Client Treatment Preferences Outpatient Therapy.   Client Statement of Needs Clearance would like to process past events, manage interpersonal stressors, manage overall symptoms, process health related issues, recent adjustments including 36 year old daughter who is pregnant and also had a toddler moving into the home, his daughter returning to an abusive relationship, relationship stressors with wife, manage day-to-day stress, improve frustration tolerance, setting and maintaining boundaries with others.    Treatment Level Weekly  Symptoms  Depression: loss of interest, feeling down, lethargy, poor appetite (medical), feeling bad about self, and trouble concentrating.  (Status: maintained) Anxiety: Feeling anxious, difficulty managing worry, worrying about different things, restlessness, irritability, and feeling afraid something awful might happen.    (Status: maintained)  Goals:   Gerald Fernandez experiences symptoms of depression and anxiety.   Treatment plan signed and available on s-drive:  Yes    Target Date: 04/06/24 Frequency: Weekly  Progress: 0 Modality: individual    Therapist will provide referrals for additional resources as appropriate.  Therapist will provide psycho-education regarding [place]'s diagnosis and corresponding treatment approaches and interventions. Licensed Clinical Social Worker, [place], LCSW will support the patient's ability to achieve the goals identified. will employ CBT, BA, Problem-solving, Solution Focused, Mindfulness,  coping skills, &  other evidenced-based practices will be used to promote progress towards healthy functioning to help manage decrease symptoms associated with his diagnosis.  Reduce overall level, frequency, and intensity of the feelings of depression, anxiety and panic evidenced by decreased overall symptoms from 6 to 7 days/week to 0 to 1 days/week per client report for at least 3 consecutive months. Verbally express understanding of the relationship between feelings of depression, anxiety and their impact on thinking patterns and behaviors. Verbalize an understanding of the role that distorted thinking plays in creating fears, excessive worry, and ruminations.  Gerald Fernandez participated in the creation of the treatment plan)   Belva Boyden, LCSW

## 2023-12-31 ENCOUNTER — Ambulatory Visit: Admitting: Family Medicine

## 2024-01-01 ENCOUNTER — Encounter: Payer: Self-pay | Admitting: Family Medicine

## 2024-01-06 ENCOUNTER — Ambulatory Visit (INDEPENDENT_AMBULATORY_CARE_PROVIDER_SITE_OTHER): Admitting: Psychology

## 2024-01-06 DIAGNOSIS — F439 Reaction to severe stress, unspecified: Secondary | ICD-10-CM

## 2024-01-06 DIAGNOSIS — F4323 Adjustment disorder with mixed anxiety and depressed mood: Secondary | ICD-10-CM | POA: Diagnosis not present

## 2024-01-06 NOTE — Progress Notes (Unsigned)
 [facility] Counselor/Therapist Progress Note  Patient ID: Gerald Fernandez, MRN: [id number]   Date: 01/06/24  Time Spent: 4:04 pm - 5:00 pm :56 Minutes  Treatment Type: Individual Therapy.  Reported Symptoms: depression  Mental Status Exam: Appearance:  Casual     Behavior: Appropriate  Motor: Normal  Speech/Language:  Normal Rate  Affect: Congruent  Mood: dysthymic  Thought process: normal  Thought content:   WNL  Sensory/Perceptual disturbances:   WNL  Orientation: oriented to person, place, time/date, and situation  Attention: Good  Concentration: Good  Memory: WNL  Fund of knowledge:  Good  Insight:   Good  Judgment:  Good  Impulse Control: Good   Risk Assessment: Danger to Self:  No Self-injurious Behavior: No Danger to Others: No Duty to Warn:no Physical Aggression / Violence:No  Access to Firearms a concern: No  Gang Involvement:No   Subjective:   Gerald Fernandez participated from his home, via video, and consented to treatment. Therapist participated from office. Gerald Fernandez provided feedback regarding the past week. He noted various transitions for family members including his daughter and wife. He noted his efforts to set boundaries for family members in various ways and is hopeful that this will continue in this direction. He noted missing his family, as a whole. He noted the start of increased marital stress began when his mother passed. He noted feelings of sadness. He noted missing is children who he wishes in more connected and willing to engage. He noted his effort to keep communication open. He noted his continued effort to set and maintain boundaries and while there is room for progress, there has been some improvement. We discussed additional effort in this area. He noted spending some time reading and listening to music more often. Therapist praised Gerald Fernandez's effort to engage in self-care and build and maintain sense of self.   Father passed in  2016.    Gerald Fernandez noted recently discovered being "tracked" by his family via a vehicle mounted GPS. He noted this is being "weaponized". He provided feedback regarding his family's behavior. He noted often that his daughter is often "manipulative". He noted feeling both a sense of distress and frustration regarding these conversations and noted difficulty managing these interactions. We explored this during the session and Gerald Fernandez provided in-depth background regarding recent interactions. We reviewed the use of boundaries, assertive communication, and redirection. Therapist modeled this during the session. We worked on feeling identification and distress management through taking breaks and developing a coping card to use during said conversations. Therapist discussed the importance of consistency. Therapist validated Gerald Fernandez's feelings and experience and provided supportive therapy. A follow-up was scheduled for continued treatment, which he benefits from.   Interventions: CBT & interpersonal.   Diagnosis:  No diagnosis found.  Psychiatric Treatment: Yes , by Gerald Fernandez, John C, MD   Treatment Plan:  Client Abilities/Strengths Gerald Fernandez is intelligent, self-aware, and expressive.   Support System: Family and friends.   Client Treatment Preferences Outpatient Therapy.   Client Statement of Needs Gerald Fernandez would like to process past events, manage interpersonal stressors, manage overall symptoms, process health related issues, recent adjustments including 29 year old daughter who is pregnant and also had a toddler moving into the home, his daughter returning to an abusive relationship, relationship stressors with wife, manage day-to-day stress, improve frustration tolerance, setting and maintaining boundaries with others.    Treatment Level Weekly  Symptoms  Depression: loss of interest, feeling down, lethargy, poor appetite (medical), feeling bad about self,  and trouble concentrating.   (Status: maintained) Anxiety: Feeling anxious, difficulty managing worry, worrying about different things, restlessness, irritability, and feeling afraid something awful might happen.    (Status: maintained)  Goals:   Gerald Fernandez experiences symptoms of depression and anxiety.   Treatment plan signed and available on s-drive:  Yes    Target Date: 04/06/24 Frequency: Weekly  Progress: 0 Modality: individual    Therapist will provide referrals for additional resources as appropriate.  Therapist will provide psycho-education regarding Gerald Fernandez's diagnosis and corresponding treatment approaches and interventions. Licensed Clinical Social Worker, [place], Gerald Fernandez will support the patient's ability to achieve the goals identified. will employ CBT, BA, Problem-solving, Solution Focused, Mindfulness,  coping skills, & other evidenced-based practices will be used to promote progress towards healthy functioning to help manage decrease symptoms associated with his diagnosis.   Reduce overall level, frequency, and intensity of the feelings of depression, anxiety and panic evidenced by decreased overall symptoms from 6 to 7 days/week to 0 to 1 days/week per client report for at least 3 consecutive months. Verbally express understanding of the relationship between feelings of depression, anxiety and their impact on thinking patterns and behaviors. Verbalize an understanding of the role that distorted thinking plays in creating fears, excessive worry, and ruminations.  Gerald Fernandez participated in the creation of the treatment plan)   Belva Boyden, Gerald Fernandez

## 2024-01-12 ENCOUNTER — Telehealth: Payer: Self-pay | Admitting: Pharmacist

## 2024-01-12 NOTE — Telephone Encounter (Signed)
 2 bottles of clofazimine  are located in the pharmacy office when patient needs a refill.  Gerald Fernandez L. Gerald Fernandez, PharmD, BCIDP, AAHIVP, CPP Infectious Diseases Clinical Pharmacist Practitioner Clinical Pharmacist Lead, Specialty Pharmacy [facility] 01/12/2024, 2:48 PM

## 2024-01-20 ENCOUNTER — Ambulatory Visit (INDEPENDENT_AMBULATORY_CARE_PROVIDER_SITE_OTHER): Admitting: Psychology

## 2024-01-20 DIAGNOSIS — F4323 Adjustment disorder with mixed anxiety and depressed mood: Secondary | ICD-10-CM

## 2024-01-20 NOTE — Progress Notes (Signed)
 [facility] Counselor/Therapist Progress Note  Patient ID: Gerald Fernandez, MRN: [id number]   Date: 01/20/24  Time Spent: 5:01 pm - 6:00  pm : 59 Minutes  Treatment Type: Individual Therapy.  Reported Symptoms: depression  Mental Status Exam: Appearance:  Casual     Behavior: Appropriate  Motor: Normal  Speech/Language:  Normal Rate  Affect: Congruent  Mood: dysthymic  Thought process: normal  Thought content:   WNL  Sensory/Perceptual disturbances:   WNL  Orientation: oriented to person, place, time/date, and situation  Attention: Good  Concentration: Good  Memory: WNL  Fund of knowledge:  Good  Insight:   Good  Judgment:  Good  Impulse Control: Good   Risk Assessment: Danger to Self:  No Self-injurious Behavior: No Danger to Others: No Duty to Warn:no Physical Aggression / Violence:No  Access to Firearms a concern: No  Gang Involvement:No   Subjective:   Gerald Fernandez participated from his home, via video, and consented to treatment. Therapist participated from office. Gerald Fernandez noted a recent move back to the family home. We worked on processing this recent transition. He noted wondering "did I give it my all?" In relation to his marriage. We explored this and the various reasons he returned home. He noted the various transitions he has experienced as a result. He noted the various "layers" of this transition for his move, his children, and his wife, and various life adjustments. Therapist highlighted negative self-talk during the session. Gerald Fernandez noted previously having a "tremendous amount of guilt". He noted getting this from his mother and that she would apologize consistently. He noted being hopeful that he and his wife can make progress in their relationship. We worked on [employer] fast streamer, possible areas of change for Gerald Fernandez, his wife, and the relationship as a whole. Gerald Fernandez discussed his interest in couple's therapy, which therapist  encouraged, and resources will be provided. Therapist encouraged Gerald Fernandez to take inventory of his own needs in the relationship and areas of growth to be processed going forward. We discussed the importance of setting reasonable expectations for the reunification and being mindful of input into interactions. Gerald Fernandez was engaged and motivated during the session. He expressed commitment towards goals. Therapist praised Gerald Fernandez and provided supportive therapy. A follow-up was scheduled for continued treatment which Gerald Fernandez benefits from.   Diagnosis:  Adjustment disorder with mixed anxiety and depressed mood  Psychiatric Treatment: Yes , by Lalonde, John C, MD   Treatment Plan:  Client Abilities/Strengths Gerald Fernandez is intelligent, self-aware, and expressive.   Support System: Family and friends.   Client Treatment Preferences Outpatient Therapy.   Client Statement of Needs Gerald Fernandez would like to process past events, manage interpersonal stressors, manage overall symptoms, process health related issues, recent adjustments including 3 year old daughter who is pregnant and also had a toddler moving into the home, his daughter returning to an abusive relationship, relationship stressors with wife, manage day-to-day stress, improve frustration tolerance, setting and maintaining boundaries with others.    Treatment Level Weekly  Symptoms  Depression: loss of interest, feeling down, lethargy, poor appetite (medical), feeling bad about self, and trouble concentrating.  (Status: maintained) Anxiety: Feeling anxious, difficulty managing worry, worrying about different things, restlessness, irritability, and feeling afraid something awful might happen.    (Status: maintained)  Goals:   Gerald Fernandez experiences symptoms of depression and anxiety.   Treatment plan signed and available on s-drive:  Yes    Target Date: 04/06/24 Frequency: Weekly  Progress: 0 Modality: individual  Therapist will  provide referrals for additional resources as appropriate.  Therapist will provide psycho-education regarding Gerald Fernandez's diagnosis and corresponding treatment approaches and interventions. Licensed Clinical Social Worker, [place], LCSW will support the patient's ability to achieve the goals identified. will employ CBT, BA, Problem-solving, Solution Focused, Mindfulness,  coping skills, & other evidenced-based practices will be used to promote progress towards healthy functioning to help manage decrease symptoms associated with his diagnosis.   Reduce overall level, frequency, and intensity of the feelings of depression, anxiety and panic evidenced by decreased overall symptoms from 6 to 7 days/week to 0 to 1 days/week per client report for at least 3 consecutive months. Verbally express understanding of the relationship between feelings of depression, anxiety and their impact on thinking patterns and behaviors. Verbalize an understanding of the role that distorted thinking plays in creating fears, excessive worry, and ruminations.  Gerald Fernandez participated in the creation of the treatment plan)   Belva Boyden, LCSW

## 2024-02-03 ENCOUNTER — Ambulatory Visit (INDEPENDENT_AMBULATORY_CARE_PROVIDER_SITE_OTHER): Admitting: Psychology

## 2024-02-03 DIAGNOSIS — F439 Reaction to severe stress, unspecified: Secondary | ICD-10-CM | POA: Diagnosis not present

## 2024-02-03 DIAGNOSIS — F4323 Adjustment disorder with mixed anxiety and depressed mood: Secondary | ICD-10-CM | POA: Diagnosis not present

## 2024-02-03 NOTE — Progress Notes (Unsigned)
 [facility] Counselor/Therapist Progress Note  Patient ID: CULLEY HEDEEN, MRN: [id number]   Date: 02/03/24  Time Spent: 4:05 pm - 5:00 pm : 55 Minutes  Treatment Type: Individual Therapy.  Reported Symptoms: depression  Mental Status Exam: Appearance:  Casual     Behavior: Appropriate  Motor: Normal  Speech/Language:  Normal Rate  Affect: Congruent  Mood: dysthymic  Thought process: normal  Thought content:   WNL  Sensory/Perceptual disturbances:   WNL  Orientation: oriented to person, place, time/date, and situation  Attention: Good  Concentration: Good  Memory: WNL  Fund of knowledge:  Good  Insight:   Good  Judgment:  Good  Impulse Control: Good   Risk Assessment: Danger to Self:  No Self-injurious Behavior: No Danger to Others: No Duty to Warn:no Physical Aggression / Violence:No  Access to Firearms a concern: No  Gang Involvement:No   Subjective:   Sylvania Evens participated from his home, via video, and consented to treatment. Therapist participated from office. He provided an update regarding his recent transition. He requested resources for couples counseling to address marital stressor and noted introducing this idea to his wife. He noted improvement in his relationship dynamics but noted that therapy would help address unresolved issues. He noted highlighting boundaries for self, going forward, and noted the benefit of this approach going forward. He noted hs wife was open to marriage counseling, as well. He noted both their efforts to engage differently, be more positive, and to avoid previous pitfalls. Therapy resources were provided via email and additional resources will be provided upon request. We worked on processing his transition, his current mood, and his feeling regarding returning home. Therapist praised Tam for his effort to engage more mindfully and positively. Therapist encouraged Aurelio to identify what makes up his sense of  self to maintain balance day to day. He noted having lost his sense of self, in the past, due to life responsibilities including family and work. He noted this being an area of concentration going forward. Therapist validated Gaius's feelings and experience, encouraged self-care, and provided supportive therapy. A follow-up was scheduled for continued treatment, which he benefits from.    Diagnosis:  Adjustment disorder with mixed anxiety and depressed mood  Trauma and stressor-related disorder  Psychiatric Treatment: Yes , by Watson Hacking, MD   Treatment Plan:  Client Abilities/Strengths Tyshun is intelligent, self-aware, and expressive.   Support System: Family and friends.   Client Treatment Preferences Outpatient Therapy.   Client Statement of Needs Kourtland would like to process past events, manage interpersonal stressors, manage overall symptoms, process health related issues, recent adjustments including 56 year old daughter who is pregnant and also had a toddler moving into the home, his daughter returning to an abusive relationship, relationship stressors with wife, manage day-to-day stress, improve frustration tolerance, setting and maintaining boundaries with others.    Treatment Level Weekly  Symptoms  Depression: loss of interest, feeling down, lethargy, poor appetite (medical), feeling bad about self, and trouble concentrating.  (Status: maintained) Anxiety: Feeling anxious, difficulty managing worry, worrying about different things, restlessness, irritability, and feeling afraid something awful might happen.    (Status: maintained)  Goals:   Jacier experiences symptoms of depression and anxiety.   Treatment plan signed and available on s-drive:  Yes    Target Date: 04/06/24 Frequency: Weekly  Progress: 0 Modality: individual    Therapist will provide referrals for additional resources as appropriate.  Therapist will provide psycho-education regarding  Jahid's diagnosis  and corresponding treatment approaches and interventions. Licensed Clinical Social Worker, [place], LCSW will support the patient's ability to achieve the goals identified. will employ CBT, BA, Problem-solving, Solution Focused, Mindfulness,  coping skills, & other evidenced-based practices will be used to promote progress towards healthy functioning to help manage decrease symptoms associated with his diagnosis.   Reduce overall level, frequency, and intensity of the feelings of depression, anxiety and panic evidenced by decreased overall symptoms from 6 to 7 days/week to 0 to 1 days/week per client report for at least 3 consecutive months. Verbally express understanding of the relationship between feelings of depression, anxiety and their impact on thinking patterns and behaviors. Verbalize an understanding of the role that distorted thinking plays in creating fears, excessive worry, and ruminations.  Portia Brittle participated in the creation of the treatment plan)   Belva Boyden, LCSW

## 2024-02-17 ENCOUNTER — Telehealth: Payer: Self-pay | Admitting: Pharmacist

## 2024-02-17 ENCOUNTER — Other Ambulatory Visit: Payer: Self-pay

## 2024-02-17 ENCOUNTER — Ambulatory Visit (INDEPENDENT_AMBULATORY_CARE_PROVIDER_SITE_OTHER): Payer: Self-pay | Admitting: Internal Medicine

## 2024-02-17 ENCOUNTER — Encounter: Payer: Self-pay | Admitting: Internal Medicine

## 2024-02-17 VITALS — BP 112/70 | HR 90 | Resp 16 | Ht 69.0 in | Wt 134.8 lb

## 2024-02-17 DIAGNOSIS — A319 Mycobacterial infection, unspecified: Secondary | ICD-10-CM | POA: Diagnosis not present

## 2024-02-17 DIAGNOSIS — J449 Chronic obstructive pulmonary disease, unspecified: Secondary | ICD-10-CM | POA: Diagnosis not present

## 2024-02-17 DIAGNOSIS — F17210 Nicotine dependence, cigarettes, uncomplicated: Secondary | ICD-10-CM

## 2024-02-17 NOTE — Telephone Encounter (Signed)
 Patient picked up 2 bottles of clofazimine  today. Supply should last for ~3 months. Next refill due around the middle/end of September.  Sheron Robin L. Cleone Hulick, PharmD, BCIDP, AAHIVP, CPP Clinical Pharmacist Practitioner Infectious Diseases Clinical Pharmacist [facility] 02/17/2024, 3:53 PM

## 2024-02-17 NOTE — Patient Instructions (Addendum)
 Please submit at least 2 more morning sputum culture  I have ordered a chest ct repeat to be done within the next 2-3 months   See me in around 4 months    Continue azithromycin , ethambutol , amikacin  inhaler, and clofazimine    Make sure you get an eye exam this year as well   Smoking Cessation: QuitlineNC 1-800-QUIT-NOW 859-632-7335); Espaol: 1-855-Djelo-Ya (1-234-092-6997) [email or web address]

## 2024-02-17 NOTE — Progress Notes (Signed)
 [facility]  CHIEF COMPLAINT:    Follow up for MAC infection  SUBJECTIVE:    Gerald Fernandez is a 58 y.o. male with PMHx as below who presents to the clinic for MAC lung infection.    02/17/24 id clinic f/u 3 months f/u See a&p for detail   03/20/23 id clinic f/u He was seen last by my partner dr Lajuana Pilar 12/2022. He was previously seen at [facility] as well He continues to smoke He has cavitary/nodular process 09/2022 cx negative after persistently positive prior to that.  He has been on therapy since 08/2021 Current regimen azith/ethambutol , clofaz, and inhaled amikacin    --------- He was last seen on 09/18/22 and also saw Dr Gretchen Leavell at [facility] on 06/17/22.  He is currently on a regimen of Azithromycin  500mg  PO daily, Ethambutol  1000mg  PO daily, Clofazimine  100mg  PO daily, and inhaled Amikacin .     His most recent AFB sputum cultures from 09/18/22 were finally negative after persistently positive cultures from time of initial diagnosis in December 2022.  Regarding his symptoms, he reports he is doing well today and no major complaints or symptoms.  He reports he continues to have cut back on his tobacco use.  He is tolerating antibiotics with no issues.  He had a repeat CT chest in January 2024 that overall showed stability with some areas of improvement.    From Dr Trevor Fudge note, I personally reviewed his chest CT films from 06/2021 and 03/2022 with Mr. Punch and his wife. Both the larger right upper lobe cavitary lesion and the smaller left upper lobe cavitary lesion have filled in and I can see some calcifications in the center of the right upper lobe lesion. I do not see any middle or lower lobe disease and he does not have a lot of surrounding emphysema. 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 Given that and ongoing tobacco use, I don't think that pursuing  surgical resection is a good idea at this point. I would recommend trying to optimize his medical therapy, working on smoking cessation, and close monitoring.   Please see A&P for the details of today's visit and status of the patient's medical problems.   Patient's Medications  New Prescriptions   No medications on file  Previous Medications   ADVAIR  HFA 115-21 MCG/ACT INHALER    INHALE 2 PUFFS INTO THE LUNGS TWICE A DAY   ALBUTEROL  (VENTOLIN  HFA) 108 (90 BASE) MCG/ACT INHALER    INHALE 2 PUFFS INTO THE LUNGS EVERY 6 HOURS AS NEEDED FOR WHEEZING OR SHORTNESS OF BREATH   AMIKACIN  SULFATE LIPOSOME (ARIKAYCE ) 590 MG/8.4ML SUSP    Inhale 590 mg into the lungs daily.   ATORVASTATIN  (LIPITOR) 10 MG TABLET    Take 1 tablet (10 mg total) by mouth daily.   AZITHROMYCIN  (ZITHROMAX ) 500 MG TABLET    Take 1 tablet (500 mg total) by mouth daily.   CLOFAZIMINE  50 MG CAPS CAPSULE (FOR COMPASSIONATE USE)    Take 100 mg by mouth daily with breakfast.   DULOXETINE  (CYMBALTA ) 60 MG CAPSULE    Take 1 capsule (60 mg total) by mouth daily.   ETHAMBUTOL  (MYAMBUTOL ) 400 MG TABLET    Take 2.5 tablets (1,000 mg total) by mouth daily.   IBUPROFEN  (ADVIL ) 800 MG TABLET    Take 800 mg by mouth in the morning and at bedtime.  MULTIPLE VITAMIN (MULTIVITAMIN WITH MINERALS) TABS TABLET    Take 1 tablet by mouth in the morning. Centrum Gummies   OXYCODONE -ACETAMINOPHEN  (PERCOCET/ROXICET) 5-325 MG TABLET    Take 1 tablet by mouth 3 (three) times daily as needed.   PREGABALIN (LYRICA) 100 MG CAPSULE    Take 100 mg by mouth at bedtime.   TADALAFIL  (CIALIS ) 20 MG TABLET    Take 1 tablet (20 mg total) by mouth daily as needed for erectile dysfunction.  Modified Medications   No medications on file  Discontinued Medications   No medications on file      Past Medical History:  Diagnosis Date   Allergy    Arthritis    Depression    situational (mother passing)   DVT (deep venous thrombosis) (HCC) 2010   GERD  (gastroesophageal reflux disease)    not at this time   Hx of pulmonary embolus 2014   Impingement syndrome of left ankle    PONV (postoperative nausea and vomiting)    Smoker 12/02/2007   Tobacco use disorder     Social History   Tobacco Use   Smoking status: Some Days    Current packs/day: 0.20    Average packs/day: 0.2 packs/day for 28.0 years (5.6 ttl pk-yrs)    Types: Cigarettes   Smokeless tobacco: Former   Tobacco comments:    Pt reports cutting back to 8 cigarettes or less per day, bnm 10/08/21  Vaping Use   Vaping status: Former  Substance Use Topics   Alcohol use: Yes    Alcohol/week: 2.0 standard drinks of alcohol    Types: 2 Standard drinks or equivalent per week    Comment: 2 times a year   Drug use: No    Family History  Problem Relation Age of Onset   Hypertension Mother    COPD Mother    Aortic dissection Mother 62   Hypertension Father    Cancer Father     Allergies  Allergen Reactions   Penicillins Other (See Comments)    From childhood Did it involve swelling of the face/tongue/throat, SOB, or low BP? Yes Did it involve sudden or severe rash/hives, skin peeling, or any reaction on the inside of your mouth or nose? Unknown Did you need to seek medical attention at a hospital or doctor's office? Unknown When did it last happen? childhood reaction. If all above answers are NO, may proceed with cephalosporin use.     Review of Systems  All other systems reviewed and are negative.  Except as noted above.  OBJECTIVE:    There were no vitals filed for this visit.   There is no height or weight on file to calculate BMI.  Physical Exam Constitutional:      Appearance: Normal appearance.  HENT:     Head: Normocephalic and atraumatic.   Eyes:     Extraocular Movements: Extraocular movements intact.     Conjunctiva/sclera: Conjunctivae normal.   Abdominal:     General: There is no distension.     Palpations: Abdomen is soft.    Musculoskeletal:     Cervical back: Normal range of motion and neck supple.   Skin:    General: Skin is warm and dry.     Coloration: Skin is not jaundiced.   Neurological:     General: No focal deficit present.     Mental Status: He is alert and oriented to person, place, and time.   Psychiatric:  Mood and Affect: Mood normal.        Behavior: Behavior normal.      Labs and Microbiology:    Latest Ref Rng & Units 10/09/2023    4:42 PM 07/10/2023    4:20 PM 03/24/2023   10:17 AM  CBC  WBC 3.8 - 10.8 Thousand/uL 6.0  7.1  6.0   Hemoglobin 13.2 - 17.1 g/dL 46.9  62.9  52.8   Hematocrit 38.5 - 50.0 % 45.6  40.6  40.6   Platelets 140 - 400 Thousand/uL 233  229  214       Latest Ref Rng & Units 10/09/2023    4:42 PM 07/10/2023    4:20 PM 03/24/2023   10:17 AM  CMP  Glucose 65 - 99 mg/dL 82  78  413   BUN 7 - 25 mg/dL 8  11  10    Creatinine 0.70 - 1.30 mg/dL 2.44  0.10  2.72   Sodium 135 - 146 mmol/L 140  142  142   Potassium 3.5 - 5.3 mmol/L 3.7  3.8  4.2   Chloride 98 - 110 mmol/L 104  104  108   CO2 20 - 32 mmol/L 29  30  30    Calcium  8.6 - 10.3 mg/dL 8.7  8.0  8.4   Total Protein 6.1 - 8.1 g/dL 6.3  6.1  6.1   Total Bilirubin 0.2 - 1.2 mg/dL 0.4  0.5  0.4   AST 10 - 35 U/L 18  17  21    ALT 9 - 46 U/L 13  12  16       Micro: 08/21/23 AFB sputum smear positive but culture negative 08/19/23 and 08/20/23 afb sputum cx negative 12/2022 afb sputum cx negative 09/2022 afb sputum cx negative 04/2022 sputum afb mac (S amikacin , clarithro; clofaz mic 0.12) 01/2022 afb sputum mac 05/2021 BAL afb cx mac  Imaging: Reviewed  08/2023 chest ct Dominant 5.3 cm right apical masslike opacity, similar in overall size but now with central cavitation, compatible with the patient's known atypical mycobacterial infection.   Additional peribronchovascular nodular opacities in the bilateral upper lobes, similar. Progressive nodularity/patchy opacity in the inferior right upper  lobe and posterior right middle lobe. This appearance remains compatible with waxing/waning atypical mycobacterial infection.   Consider follow-up CT chest in 1 year.    ASSESSMENT & PLAN:    No problem-specific Assessment & Plan notes found for this encounter.    No orders of the defined types were placed in this encounter.    Nodulocavitary mac lung Smoker Copd    Abx (discussed with our pharmacist Cassie) 09/2021-c clofazimine  08/2021-c ethambutol  08/2021-c azithromycin  08/2021-c arikayce     08/2021-10/2021 moxifloxacin  08/2021-06/2022 amikacin   Started on azith, moxi, ethambutol , and inhaled amikacin  to be aggressive. susceptibilities came back and it was R to moxi, so stopped that and substituted clofaz. had continued sputum production and + cultures despite 6 months of this treatment so changed to IV amikacin . He saw Gretchen Leavell at [facility] and he said MIC was too high for IV amikacin  to get benefit so advised to switch back to inhaled amikacin  and this is where we are now. Rifampin not used initially due to ddi with his partial opioid agonist     Sputums 3 sets Blood tests Stop smoking See me in 3 months   ---------- 07/10/2023 id assessment Still smokes -- advise stopping smoking No sx change since on MAC treatment (2 miles biking; walk flat ground 2 miles [place]  trouble); no b symptoms. Good appetite No abx side effect Yet to give sputum Due for repeat chest ct   Discussed he has been on mac tx for 2 years and no change in symptoms. We discussed potentially if ct stable/improved and despite sputum inability to clear we might be able to stop antibiotics because utlimately goals is quality of life and also because high risk of mac recolonization/reinfection   F/u 3 months Labs today Chest ct repeat   10/09/23 id clinic assessment Patient felt clinically same as last visit He hasn't seen pulmonology for follow up --- baseline doesn't get short  of breath unless he walks 4 flight of stairs and he does all heavy chores ok Still smokes  Ct from 08/2023 reviewed for most part stable but rul apical lesion cavitary now -- will defer to pulm if feels need to biopsy to look for cancer given smoking hx (no b sx)  Minimal intermittent cough; no weight loss; appetite is good  08/21/23 sputum still growing afb  Given cavitary change and ongoing positive sample sputum, will consider adding omadocycline to regimen   Will need to get susceptibility  3 more samples sputum ordered  Cbc, cmp  F/u 8 weeks   11/19/23 id clinic assessment I discussed with our pharmacy team. There seems to be some confusion from patient in terms of taking all the medications for his ntm I discuss need to take clofaz, azith, ethambutol , and arikayce   Dr Washington Hacker Chi called me today and relayed that his pft has been stable. She does want to repeat chest ct in 6 months. We discussed that if his ct is worse and the ntm is suppressed, will likely needs to look for other organism causing lung disease  He doesn't feel any different in terms of respiratory sx. No fever, chill, worsening cough, worsening dyspnea  His 08/2023 cultures showed afb but no growth on culture   F/u 3 months and will get more culture  --------------- 02/17/24 id clinic assessment He has had negative sputum culture since 09/2022 on 3 different occasions. Last sputum 08/21/23 afb smear positive but cultures negative  Patient has standing orders for 3 set of afb sputum to be done first thing in the morning   No cough No fever, chill, nightsweat No worsening dyspnea since last visit No visual decrement  Still smoke   -advise patient to schedule eye exam -repeat 2-3 set of afb sputum cultures again -if negative cultures could consider proposal to stop mac treatment although relapse rate is high -chest ct within the next 2-3 months -follow after chest ct so 3-4 months (make sure sputum  is done at least 6 weeks before follow up) -chart forwarded to dr Chi  Madelaine Scarpa for Infectious Disease [facility] 02/17/2024, 3:44 PM

## 2024-02-20 ENCOUNTER — Ambulatory Visit: Admitting: Psychology

## 2024-02-20 DIAGNOSIS — F4323 Adjustment disorder with mixed anxiety and depressed mood: Secondary | ICD-10-CM

## 2024-02-20 DIAGNOSIS — F439 Reaction to severe stress, unspecified: Secondary | ICD-10-CM | POA: Diagnosis not present

## 2024-02-20 NOTE — Progress Notes (Signed)
 [facility] Counselor/Therapist Progress Note  Patient ID: Gerald Fernandez, MRN: [id number]   Date: 02/20/24  Time Spent: 4:06 pm - 4:53 pm : 47 Minutes  Treatment Type: Individual Therapy.  Reported Symptoms: depression  Mental Status Exam: Appearance:  Casual     Behavior: Appropriate  Motor: Normal  Speech/Language:  Normal Rate  Affect: Congruent  Mood: dysthymic  Thought process: normal  Thought content:   WNL  Sensory/Perceptual disturbances:   WNL  Orientation: oriented to person, place, time/date, and situation  Attention: Good  Concentration: Good  Memory: WNL  Fund of knowledge:  Good  Insight:   Good  Judgment:  Good  Impulse Control: Good   Risk Assessment: Danger to Self:  No Self-injurious Behavior: No Danger to Others: No Duty to Warn:no Physical Aggression / Violence:No  Access to Firearms a concern: No  Gang Involvement:No   Subjective:   Gerald Fernandez participated from his home, via video, and consented to treatment. Therapist participated from home office. He provided an update regarding his recent transition.  Gerald Fernandez noted recently initiating couple's counseling with a therapist, Gerald Hazel, LMFT, and noted that the first appointment going quite well. He noted the adjustment to the transition back home. We worked on exploring this during the session, working on feeling identification, and processed this throughout the session. He noted that sometimes I have a tendency to beat myself up and noted having to deal with feelings of guilt. Therapist normalized Gerald Fernandez's feelings and experience. We discussed that life transitions can be complicated and have various feelings related to the transition. Therapist encouraged Gerald Fernandez to address his daily needs, set boundaries over rumination, set reasonable expectations for self in relation to progress in this area, and focusing on the positives. Therapist encouraged Gerald Fernandez to engage in  consistent self-care. Gerald Fernandez was engaged and motivated during the session. He noted his intent to address his stressors proactively and expressed commitment towards session goals. Therapist provided supportive therapy. A follow-up was scheduled for continued treatment which he benefits from.   Diagnosis:  Adjustment disorder with mixed anxiety and depressed mood  Trauma and stressor-related disorder  Psychiatric Treatment: Yes , by Gerald Hacking, MD   Treatment Plan:  Client Abilities/Strengths Gerald Fernandez is intelligent, self-aware, and expressive.   Support System: Family and friends.   Client Treatment Preferences Outpatient Therapy.   Client Statement of Needs Gerald Fernandez would like to process past events, manage interpersonal stressors, manage overall symptoms, process health related issues, recent adjustments including 56 year old daughter who is pregnant and also had a toddler moving into the home, his daughter returning to an abusive relationship, relationship stressors with wife, manage day-to-day stress, improve frustration tolerance, setting and maintaining boundaries with others.    Treatment Level Weekly  Symptoms  Depression: loss of interest, feeling down, lethargy, poor appetite (medical), feeling bad about self, and trouble concentrating.  (Status: maintained) Anxiety: Feeling anxious, difficulty managing worry, worrying about different things, restlessness, irritability, and feeling afraid something awful might happen.    (Status: maintained)  Goals:   [name] experiences symptoms of depression and anxiety.   Treatment plan signed and available on s-drive:  Yes    Target Date: 04/06/24 Frequency: Weekly  Progress: 0 Modality: individual    Therapist will provide referrals for additional resources as appropriate.  Therapist will provide psycho-education regarding Gerald Fernandez's diagnosis and corresponding treatment approaches and interventions. Licensed Clinical  Social Worker, Laporte, LCSW will support the patient's ability to achieve the goals  identified. will employ CBT, BA, Problem-solving, Solution Focused, Mindfulness,  coping skills, & other evidenced-based practices will be used to promote progress towards healthy functioning to help manage decrease symptoms associated with his diagnosis.   Reduce overall level, frequency, and intensity of the feelings of depression, anxiety and panic evidenced by decreased overall symptoms from 6 to 7 days/week to 0 to 1 days/week per client report for at least 3 consecutive months. Verbally express understanding of the relationship between feelings of depression, anxiety and their impact on thinking patterns and behaviors. Verbalize an understanding of the role that distorted thinking plays in creating fears, excessive worry, and ruminations.  Gerald Fernandez participated in the creation of the treatment plan)   Gerald Boyden, LCSW

## 2024-03-02 ENCOUNTER — Other Ambulatory Visit: Payer: Self-pay

## 2024-03-02 ENCOUNTER — Other Ambulatory Visit

## 2024-03-02 DIAGNOSIS — A310 Pulmonary mycobacterial infection: Secondary | ICD-10-CM

## 2024-03-03 ENCOUNTER — Other Ambulatory Visit

## 2024-03-03 ENCOUNTER — Other Ambulatory Visit: Payer: Self-pay

## 2024-03-03 DIAGNOSIS — A310 Pulmonary mycobacterial infection: Secondary | ICD-10-CM

## 2024-03-04 ENCOUNTER — Other Ambulatory Visit: Payer: Self-pay

## 2024-03-04 ENCOUNTER — Other Ambulatory Visit

## 2024-03-04 DIAGNOSIS — A310 Pulmonary mycobacterial infection: Secondary | ICD-10-CM

## 2024-03-15 ENCOUNTER — Ambulatory Visit (INDEPENDENT_AMBULATORY_CARE_PROVIDER_SITE_OTHER): Admitting: Psychology

## 2024-03-15 DIAGNOSIS — F4323 Adjustment disorder with mixed anxiety and depressed mood: Secondary | ICD-10-CM | POA: Diagnosis not present

## 2024-03-15 NOTE — Progress Notes (Unsigned)
 [facility] Counselor/Therapist Progress Note  Patient ID: Gerald Fernandez, MRN: [id number]   Date: 03/15/24  Time Spent: 4:35 pm - 5:32 pm : 57 Minutes  Treatment Type: Individual Therapy.  Reported Symptoms: depression  Mental Status Exam: Appearance:  Casual     Behavior: Appropriate  Motor: Normal  Speech/Language:  Normal Rate  Affect: Congruent  Mood: dysthymic  Thought process: normal  Thought content:   WNL  Sensory/Perceptual disturbances:   WNL  Orientation: oriented to person, place, time/date, and situation  Attention: Good  Concentration: Good  Memory: WNL  Fund of knowledge:  Good  Insight:   Good  Judgment:  Good  Impulse Control: Good   Risk Assessment: Danger to Self:  No Self-injurious Behavior: No Danger to Others: No Duty to Warn:no Physical Aggression / Violence:No  Access to Firearms a concern: No  Gang Involvement:No   Subjective:   Belvie LELON Cable participated from his home, via video, and consented to treatment. Therapist participated from home office.Nolawi noted the events of the past two weeks. He noted that he and his wife continue to attend couple's counseling. He reflected on this during the session and noted his session getting heated. We explored this during the session. He noted the stress of sessions, at times, and having to traverse this. He noted his and his wife putting boundaries up for their daughter. He noted the differentiation between loving his family but, at times, not liking specific behaviors. We worked on processing this. We explored Admir's own needs and therapist encouraged work in this area. He noted distrubed sleep and noted I run on caffeine and nicotine . He noted often staying up for long periods of time. We worked on identifying possible contributing factors to his sleep including his caffeine and nicotine  intake, night-time rumination. He noted often playing his guitar when he is unable to sleep. We  reviewed sleep hygiene during the session and therapist provided handout via email for reference and review. We discussed the importance of reducing his intake of stimulants and to work on making incremental changes over time.Therapist provided psycho-education regarding sleep and the effect of sleep on mood and functioning. Nishaan was engaged and motivated during the session. He expressed commitment towards goals including reduction of stimulants. Therapist validated Cashton's feelings and experience during the session and provided supportive therapy. A follow-up was scheduled for continued treatment, which he benefits from.    Diagnosis:  Adjustment disorder with mixed anxiety and depressed mood  Psychiatric Treatment: Yes , by Lalonde, John C, MD   Treatment Plan:  Client Abilities/Strengths Tresean is intelligent, self-aware, and expressive.   Support System: Family and friends.   Client Treatment Preferences Outpatient Therapy.   Client Statement of Needs Dacen would like to process past events, manage interpersonal stressors, manage overall symptoms, process health related issues, recent adjustments including 23 year old daughter who is pregnant and also had a toddler moving into the home, his daughter returning to an abusive relationship, relationship stressors with wife, manage day-to-day stress, improve frustration tolerance, setting and maintaining boundaries with others.    Treatment Level Weekly  Symptoms  Depression: loss of interest, feeling down, lethargy, poor appetite (medical), feeling bad about self, and trouble concentrating.  (Status: maintained) Anxiety: Feeling anxious, difficulty managing worry, worrying about different things, restlessness, irritability, and feeling afraid something awful might happen.    (Status: maintained)  Goals:   Benjie experiences symptoms of depression and anxiety.   Treatment plan signed and available on  s-drive:  Yes     Target Date: 04/06/24 Frequency: Weekly  Progress: 0 Modality: individual    Therapist will provide referrals for additional resources as appropriate.  Therapist will provide psycho-education regarding Alvester's diagnosis and corresponding treatment approaches and interventions. Licensed Clinical Social Worker, Adrian, LCSW will support the patient's ability to achieve the goals identified. will employ CBT, BA, Problem-solving, Solution Focused, Mindfulness,  coping skills, & other evidenced-based practices will be used to promote progress towards healthy functioning to help manage decrease symptoms associated with his diagnosis.   Reduce overall level, frequency, and intensity of the feelings of depression, anxiety and panic evidenced by decreased overall symptoms from 6 to 7 days/week to 0 to 1 days/week per client report for at least 3 consecutive months. Verbally express understanding of the relationship between feelings of depression, anxiety and their impact on thinking patterns and behaviors. Verbalize an understanding of the role that distorted thinking plays in creating fears, excessive worry, and ruminations.  Dru participated in the creation of the treatment plan)   Elvie Mullet, LCSW

## 2024-03-19 ENCOUNTER — Telehealth: Payer: Self-pay

## 2024-03-19 NOTE — Telephone Encounter (Signed)
 Received call from [facility] with [facility], they have been trying to get in touch with the patient to refill his Arikayce . Will send MyChart message asking patient to call them back.   Nailea Whitehorn, BSN, RN

## 2024-03-30 ENCOUNTER — Ambulatory Visit: Admitting: Psychology

## 2024-04-07 ENCOUNTER — Ambulatory Visit: Admitting: Family Medicine

## 2024-04-07 ENCOUNTER — Encounter: Payer: Self-pay | Admitting: Family Medicine

## 2024-04-07 VITALS — BP 128/72 | HR 68 | Ht 69.0 in | Wt 137.4 lb

## 2024-04-07 DIAGNOSIS — A310 Pulmonary mycobacterial infection: Secondary | ICD-10-CM | POA: Diagnosis not present

## 2024-04-07 DIAGNOSIS — F17200 Nicotine dependence, unspecified, uncomplicated: Secondary | ICD-10-CM | POA: Diagnosis not present

## 2024-04-07 DIAGNOSIS — Z23 Encounter for immunization: Secondary | ICD-10-CM

## 2024-04-07 DIAGNOSIS — J984 Other disorders of lung: Secondary | ICD-10-CM | POA: Diagnosis not present

## 2024-04-07 DIAGNOSIS — G8921 Chronic pain due to trauma: Secondary | ICD-10-CM | POA: Diagnosis not present

## 2024-04-07 DIAGNOSIS — Z981 Arthrodesis status: Secondary | ICD-10-CM

## 2024-04-07 DIAGNOSIS — Z1211 Encounter for screening for malignant neoplasm of colon: Secondary | ICD-10-CM

## 2024-04-07 NOTE — Progress Notes (Unsigned)
   Subjective:    Patient ID: Gerald Fernandez, male    DOB: 1966/04/16, 58 y.o.   MRN: [id number]  HPI He is here for consult concerning difficulty with left foot pain as a consequence to having ankle fracture, plates and screws put in there.  He was in pain management through [facility] until his insurance ran out.  Since then he has switched to an over-the-counter medicine called Kratom.  He will he was using apparently relatively high doses of this to get pain relief.  He noted that he had to keep taking increasing dosing to get pain relief.  He did get some euphoria out of it but also was having difficulty with fatigue and then he tried to stop.  He was apparently having withdrawal symptoms of sweating, irritability, flulike symptoms.  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

## 2024-04-08 ENCOUNTER — Telehealth: Payer: Self-pay

## 2024-04-08 NOTE — Telephone Encounter (Signed)
 Submitted a Prior Authorization request to [employer] for ARIKAYCE  via CoverMyMeds. Will update once we receive a response.  J Code: CPT:  PA ID: [facility]

## 2024-04-09 ENCOUNTER — Other Ambulatory Visit (HOSPITAL_COMMUNITY): Payer: Self-pay

## 2024-04-09 ENCOUNTER — Telehealth: Payer: Self-pay

## 2024-04-09 NOTE — Telephone Encounter (Signed)
 Received notification from [employer] regarding a prior authorization for Arikayce . Authorization has been APPROVED from 04/08/24 to 10/09/24.    Patient can continue to fill through  [facility]   Authorization #  [id number]

## 2024-04-16 LAB — MYCOBACTERIA,CULT W/FLUOROCHROME SMEAR
MICRO NUMBER:: 16651509
SMEAR:: NONE SEEN
SPECIMEN QUALITY:: ADEQUATE

## 2024-04-18 LAB — MYCOBACTERIA,CULT W/FLUOROCHROME SMEAR
MICRO NUMBER:: 16656932
MICRO NUMBER:: 16659305
SMEAR:: NONE SEEN
SMEAR:: NONE SEEN
SPECIMEN QUALITY:: ADEQUATE
SPECIMEN QUALITY:: ADEQUATE

## 2024-05-07 ENCOUNTER — Ambulatory Visit (HOSPITAL_COMMUNITY)
Admission: RE | Admit: 2024-05-07 | Discharge: 2024-05-07 | Disposition: A | Discharge status: Home / Self Care | Source: Ambulatory Visit | Attending: Internal Medicine | Admitting: Internal Medicine

## 2024-05-07 DIAGNOSIS — A319 Mycobacterial infection, unspecified: Secondary | ICD-10-CM | POA: Diagnosis present

## 2024-05-19 ENCOUNTER — Ambulatory Visit
Admission: RE | Admit: 2024-05-19 | Discharge: 2024-05-19 | Disposition: A | Discharge status: Home / Self Care | Source: Ambulatory Visit | Attending: Pain Medicine | Admitting: Pain Medicine

## 2024-05-19 ENCOUNTER — Ambulatory Visit (HOSPITAL_BASED_OUTPATIENT_CLINIC_OR_DEPARTMENT_OTHER): Payer: Self-pay | Admitting: Pain Medicine

## 2024-05-19 ENCOUNTER — Encounter: Payer: Self-pay | Admitting: Pain Medicine

## 2024-05-19 ENCOUNTER — Ambulatory Visit
Admission: RE | Admit: 2024-05-19 | Discharge: 2024-05-19 | Disposition: A | Discharge status: Home / Self Care | Attending: Pain Medicine | Admitting: Pain Medicine

## 2024-05-19 VITALS — BP 123/75 | HR 70 | Temp 97.8°F | Ht 70.0 in | Wt 130.0 lb

## 2024-05-19 DIAGNOSIS — G894 Chronic pain syndrome: Secondary | ICD-10-CM

## 2024-05-19 DIAGNOSIS — G8921 Chronic pain due to trauma: Secondary | ICD-10-CM

## 2024-05-19 DIAGNOSIS — G8929 Other chronic pain: Secondary | ICD-10-CM | POA: Diagnosis present

## 2024-05-19 DIAGNOSIS — R9389 Abnormal findings on diagnostic imaging of other specified body structures: Secondary | ICD-10-CM

## 2024-05-19 DIAGNOSIS — M545 Low back pain, unspecified: Secondary | ICD-10-CM

## 2024-05-19 DIAGNOSIS — M25552 Pain in left hip: Secondary | ICD-10-CM | POA: Insufficient documentation

## 2024-05-19 DIAGNOSIS — M25572 Pain in left ankle and joints of left foot: Secondary | ICD-10-CM | POA: Diagnosis present

## 2024-05-19 DIAGNOSIS — M25562 Pain in left knee: Secondary | ICD-10-CM

## 2024-05-19 DIAGNOSIS — F1990 Other psychoactive substance use, unspecified, uncomplicated: Secondary | ICD-10-CM | POA: Insufficient documentation

## 2024-05-19 DIAGNOSIS — M79605 Pain in left leg: Secondary | ICD-10-CM | POA: Diagnosis not present

## 2024-05-19 DIAGNOSIS — M899 Disorder of bone, unspecified: Secondary | ICD-10-CM | POA: Insufficient documentation

## 2024-05-19 DIAGNOSIS — Z87898 Personal history of other specified conditions: Secondary | ICD-10-CM | POA: Insufficient documentation

## 2024-05-19 DIAGNOSIS — Z789 Other specified health status: Secondary | ICD-10-CM | POA: Insufficient documentation

## 2024-05-19 DIAGNOSIS — Z79899 Other long term (current) drug therapy: Secondary | ICD-10-CM

## 2024-05-19 DIAGNOSIS — Z880 Allergy status to penicillin: Secondary | ICD-10-CM | POA: Insufficient documentation

## 2024-05-19 NOTE — Patient Instructions (Addendum)
 ______________________________________________________________________    New Patients  Welcome to [facility].   Initial Visit The first or initial visit consists of an evaluation only.   Interventional pain management.  This is an interventional pain management medical practice. This means that we offer therapies other than prescription controlled substances to manage chronic pain. These therapies may include, but are not limited to, diagnostic, therapeutic, and palliative specialized injection therapies (i.e.: Epidural Steroids, Facet Blocks, etc.). We specialize in a variety of nerve blocks as well as radiofrequency treatments. We offer pain implant evaluations and trials, as well as follow up management. In addition we also provide a variety joint injections, including Viscosupplementation (AKA: Gel Therapy).  Prescription Pain Medication. We specialize in alternatives to opioids.  Although we can provide evaluations and recommendations for/of pharmacologic therapies based on CDC Guidelines, we no longer take patients for long-term medication management. Please be clear that we will not be taking over the prescribing of your pain medications.  ______________________________________________________________________      ______________________________________________________________________    Patient Information update  To: All of our patients.  Re: Name change.  It has been made official that our current name, "[facility]"   will soon be changed to "[facility]".   The purpose of this change is to eliminate any confusion created by the concept of our practice being a "Medication Management Pain Clinic". In the past this has led to the misconception that we treat pain primarily by the use of prescription  medications.  Nothing can be farther from the truth.   Understanding PAIN MANAGEMENT: To further understand what our practice does, you first have to understand that "Pain Management" is a subspecialty that requires additional training once a physician has completed their specialty training, which can be in either Anesthesia, Neurology, Psychiatry, or Physical Medicine and Rehabilitation (PMR). Each one of these contributes to the final approach taken by each physician to the management of their patient's pain. To be a "Pain Management Specialist" you must have first completed one of the specialty trainings below.  Anesthesiologists - trained in clinical pharmacology and interventional techniques such as nerve blockade and regional as well as central neuroanatomy. They are trained to block pain before, during, and after surgical interventions.  Neurologists - trained in the diagnosis and pharmacological treatment of complex neurological conditions, such as Multiple Sclerosis, Parkinson's, spinal cord injuries, and other systemic conditions that may be associated with symptoms that may include but are not limited to pain. They tend to rely primarily on the treatment of chronic pain using prescription medications.  Psychiatrist - trained in conditions affecting the psychosocial wellbeing of patients including but not limited to depression, anxiety, schizophrenia, personality disorders, addiction, and other substance use disorders that may be associated with chronic pain. They tend to rely primarily on the treatment of chronic pain using prescription medications.   Physical Medicine and Rehabilitation (PMR) physicians, also known as physiatrists - trained to treat a wide variety of medical conditions affecting the brain, spinal cord, nerves, bones, joints, ligaments, muscles, and tendons. Their training is primarily aimed at treating patients that have suffered injuries that have caused severe physical  impairment. Their training is primarily aimed at the physical therapy and rehabilitation of those patients. They may also work alongside orthopedic surgeons or neurosurgeons using their expertise in assisting surgical patients to recover after their surgeries.  INTERVENTIONAL PAIN MANAGEMENT is  sub-subspecialty of Pain Management.  Our physicians are Board-certified in Anesthesia, Pain Management, and Interventional Pain Management.  This meaning that not only have they been trained and Board-certified in their specialty of Anesthesia, and subspecialty of Pain Management, but they have also received further training in the sub-subspecialty of Interventional Pain Management, in order to become Board-certified as INTERVENTIONAL PAIN MANAGEMENT SPECIALIST.    Mission: Our goal is to use our skills in  INTERVENTIONAL PAIN MANAGEMENT as alternatives to the chronic use of prescription opioid medications for the treatment of pain. To make this more clear, we have changed our name to reflect what we do and offer. We will continue to offer medication management assessment and recommendations, but we will not be taking over any patient's medication management.  ______________________________________________________________________       ______________________________________________________________________    Appointment Information  It is our goal and responsibility to provide the medical community with assistance in the evaluation and management of patients with chronic pain. Unfortunately our resources are limited. Because we do not have an unlimited amount of time, or available appointments, we are required to closely monitor for unkept or cancelled appointments.  Patient's responsibilities: 1. Punctuality: Patients are required to be physically present in our office at least 15 minutes before their scheduled appointment. 2. Tardiness: Patients not physically present in our office at their scheduled  appointment time will be rescheduled. 3. Plan ahead: Assume that you will encounter traffic and plan to arrive 30 minutes before your appointment. 4. Other appointments and responsibilities: Do not schedule other appointments immediately before or after your scheduled appointment.  5. Be prepared: Make a list of everything that you need to discuss with your provider so that you use your time efficiently. Once the provider leaves your room, he/she will not return to your room to discuss anything that you neglected to bring up during your allowed time. 6. No children or pets: Do not bring children or pets to your appointment. 7. Cancelling or rescheduling your appointment: Advanced notification (more than 24 hours in advance) is required. 8. No Show: Not calling to cancel an appointment and simply not showing up is unacceptable. This leads to loss of appointments that could have been used by a patient in need. (See below)  Corrective process for repeat offenders:  No Shows: Three (3) No Shows within a 12 month period will result in an automatic discharge from our practice. Rescheduling or cancelling with more than 24 hours notice will not be penalized and will not count against you. Tardiness: If you have to be rescheduled three (3) times due to late arrivals, it will be counted as one (1) No Show. Cancellation or reschedule: Three (3) cancellations or rescheduling where notice was given with less than 24 hours in advance, will be recorded as one (1) No Show.  Types of appointments: New patient initial evaluation: These are evaluations only. Your initial patient questionnaire will be collected and entered into the system. A history of present illness will be taken. Prior lab work, imaging studies, and associated treatments will be reviewed. The provider may order appropriate diagnostic testing depending on their evaluation and review of available information. No treatments will be started on  this visit. 2nd Follow-up visit: During this visit your provider will inform you of the results of the diagnostic tests ordered on the initial evaluation. Based on the providers assessment, treatment options will be offered, at which the patient will decide if he/she is interested in the alternatives. If  interested, a treatment plan will be established and started. Procedure visits: Post-procedure evaluation visits: Evaluation visits MM New problems Flare-up evaluations Follow-up after diagnostic testing ______________________________________________________________________    Bobby can refer to a chemical company that produces specialty polymers and pine-based chemicals, or a type of royal palace in [place] There is also a term for a stable part of the Earth's crust called a craton. The context of the term Kraton depends on whether it's referring to a company, a place, or a geological feature.   Kratom can cause a range of adverse effects, including mild side effects like nausea and constipation, and more serious risks such as liver toxicity, seizures, and substance use disorder. Since kratom is unregulated in the [place] product purity and potency can be inconsistent, posing additional risks.   Serious risks Long-term or high-dose kratom use can lead to more severe adverse effects affecting multiple organ systems.   Liver damage: There have been reports of liver toxicity, including elevated liver enzymes, hepatitis, and jaundice.  Addiction and dependence: Regular users can develop a substance use disorder with dependence and withdrawal symptoms similar to those of opioids. Withdrawal symptoms can include muscle spasms, pain, and anxiety.  Psychiatric problems: Psychosis, including hallucinations, delusions, and confusion, has been reported in individuals addicted to kratom.  Seizures: Kratom use, particularly when mixed with other substances, can increase the risk of  seizures.  Respiratory issues: In high doses, kratom's opioid-like effects can lead to slowed breathing.  Neonatal abstinence syndrome (NAS): Infants born to mothers who used kratom during pregnancy have experienced withdrawal symptoms.  Overdose and death: Fatalities have been linked to kratom use, though in many reported cases, kratom was used alongside other drugs.   Interaction with other substances The risks of kratom are significantly heightened when combined with other drugs, including other sedatives or stimulants.   Drug interactions: Kratom can interact with certain medications by affecting how the liver breaks them down, which can change the drugs' effects and side effects.  Heavy metals and contaminants: Unregulated kratom products have been found to contain dangerous levels of heavy metals and bacteria like salmonella.   Regulatory status The [place]. Food and Drug Administration (FDA) has not approved any uses for kratom and warns consumers to avoid products containing it due to the risk of serious adverse events.

## 2024-05-19 NOTE — Progress Notes (Signed)
 Safety precautions to be maintained throughout the outpatient stay will include: orient to surroundings, keep bed in low position, maintain call bell within reach at all times, provide assistance with transfer out of bed and ambulation.

## 2024-05-19 NOTE — Progress Notes (Signed)
 PROVIDER NOTE: Interpretation of information contained herein should be left to medically-trained personnel. Specific patient instructions are provided elsewhere under Patient Instructions section of medical record. This document was created in part using AI and STT-dictation technology, any transcriptional errors that may result from this process are unintentional.  Patient: Gerald Fernandez  Service: E/M Encounter  Provider: Eric DELENA Como, MD  DOB: March 31, 1966  Delivery: Face-to-face  Specialty: Interventional Pain Management  MRN: [id number]  Setting: Ambulatory outpatient facility  Specialty designation: 09  Type: New Patient  Location: Outpatient office facility  PCP: Joyce Norleen BROCKS, MD  DOS: 05/19/2024    Referring Prov.: Joyce Norleen BROCKS, MD   Primary Reason(s) for Visit: Encounter for initial evaluation of one or more chronic problems (new to examiner) potentially causing chronic pain, and posing a threat to normal musculoskeletal function. (Level of risk: High) CC: Ankle Pain (left)  HPI  Gerald Fernandez is a 58 y.o. year old, male patient, who comes for the first time to our practice referred by Joyce Norleen BROCKS, MD for our initial evaluation of his chronic pain. 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 Today he comes in for evaluation of his Ankle  Pain (left)  Pain Assessment: Location: Left Ankle Radiating: radiates up left shin to knee then to left hip Onset: More than a month ago Duration: Chronic pain Quality: Burning, Aching, Dull Severity: 5 /10 (subjective, self-reported pain score)  Effect on ADL: limits ADLS Timing: Intermittent Modifying factors: Kratom BP: 123/75  HR: 70  Onset and Duration: Present longer than 3 months Cause of pain: Work related accident or event Severity: NAS-11 at its worse: 10/10, NAS-11 at its best: 5/10, NAS-11 now: 7/10, and NAS-11 on the average: 7/10 Timing: Afternoon, Night, After activity or exercise, and After a period of immobility Aggravating Factors: Climbing, Prolonged standing, Walking uphill, and Walking downhill Alleviating Factors: Cold packs, Medications, and TENS Associated Problems: Depression, Numbness, Personality changes, Tingling, Weakness, and Pain that wakes patient up Quality of Pain: Aching, Burning, Deep, Hot, Sharp, Shooting, Stabbing, Tingling, Tiring, and Uncomfortable Previous Examinations or Tests: X-rays Previous Treatments: Narcotic medications and TENS  Mr. Harbor is being evaluated for possible interventional pain management therapies for the treatment of his chronic pain.  Discussed the use of AI scribe software for clinical note transcription with the patient, who gave verbal consent to proceed.  History of Present Illness   Gerald Fernandez is a 58 year old male who presents with chronic left ankle pain and substance use concerns. He is accompanied by his wife, Jon.  He sustained a left ankle injury over ten years ago, leading to a 'clean out' procedure and a subsequent fusion surgery. He believes the fusion did not fully integrate, and he later fractured his talus bone. He experiences pain in his left heel, ankle, and radiating pain up to his shin, knee, and hip. He rarely experiences back pain.  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 He consumes 40 to 45 Kratom pills daily, taking 8 to 12 pills at a time, four times a day. He experiences withdrawal symptoms, including flu-like symptoms, shaking, and unease when attempting to stop Kratom.  He has had two surgeries on his left ankle, the last in 2021, with no postoperative infections. He has not undergone physical therapy or nerve conduction studies. He receives cortisone injections in the ankle joint, two to three times per year. He has not had recent imaging studies or interventional therapies such as nerve blocks or implants for pain management.      Mr. Kritikos has been informed that this initial visit was an evaluation only.  On the follow up appointment I will go over the results, including ordered tests and available interventional therapies. At that time he will have the opportunity to decide whether to proceed with offered therapies or not. In the event that Mr. Delisle prefers avoiding interventional options, this will conclude our involvement in the case.  Medication management recommendations may be provided upon request.  Patient informed that diagnostic tests may be ordered to assist in identifying underlying causes, narrow the list of differential diagnoses and aid in determining candidacy for (or contraindications to) planned therapeutic interventions.  Historic Controlled Substance Pharmacotherapy Review PMP and historical list of controlled substances: Pregabalin (Lyrica) 50 mg capsule, 1 cap p.o. daily (30/month) (# 15) (last filled on 01/24/2024); oxycodone /APAP 5/325, 1-1/2 tablets p.o. daily (45/month) (# 45) (last filled on 08/04/2023) Most recently prescribed controlled substance(s): Opioid Analgesic: None MME/day: 0 mg/day  Historical Monitoring: The patient  reports no history of drug use. List of prior UDS Testing: No results found for: MDMA, COCAINSCRNUR, PCPSCRNUR, PCPQUANT,  CANNABQUANT, THCU, ETH, CBDTHCR, D8THCCBX, D9THCCBX Historical Background Evaluation: [place] PMP: PDMP reviewed during this encounter. Review of the past 66-months conducted.             PMP NARX Score Report:  Narcotic: 200 Sedative: 170 Stimulant: 000 [place] Department of public safety, offender search: Engineer, mining Information) Non-contributory Risk Assessment Profile: Aberrant behavior: None observed or detected today Risk factors for fatal opioid overdose: None identified today PMP NARX Overdose Risk Score: 370 Fatal overdose hazard ratio (HR): Calculation deferred Non-fatal overdose hazard ratio (HR): Calculation deferred Risk of opioid abuse or dependence: 0.7-3.0% with doses <= 36 MME/day and 6.1-26% with doses >= 120 MME/day. Substance use disorder (SUD) risk level: See below Personal History of Substance Abuse (SUD-Substance use disorder):  Alcohol: Negative  Illegal Drugs: Negative  Rx Drugs: Positive Male or Male (pt states previous opioid use; now uses Kratom)  ORT Risk Level calculation: High Risk  Opioid Risk Tool - 05/19/24 0813       Family History of Substance Abuse   Alcohol Positive Male   father, brother   Illegal Drugs Negative    Rx Drugs Negative      Personal History of Substance Abuse   Alcohol Negative    Illegal Drugs Negative    Rx Drugs Positive Male or Male   pt states previous opioid use; now uses Kratom     Age   Age between 12-45 years  No      Psychological Disease   Psychological Disease Negative    Depression Negative      Total Score   Opioid Risk Tool Scoring 8    Opioid Risk Interpretation High Risk  ORT Scoring interpretation table:  Score <3 = Low Risk for SUD  Score between 4-7 = Moderate Risk for SUD  Score >8 = High Risk for Opioid Abuse   PHQ-2 Depression Scale:  Total score: 2  PHQ-2 Scoring interpretation table: (Score and probability of major depressive disorder)  Score 0 = No depression  Score 1 =  15.4% Probability  Score 2 = 21.1% Probability  Score 3 = 38.4% Probability  Score 4 = 45.5% Probability  Score 5 = 56.4% Probability  Score 6 = 78.6% Probability   PHQ-9 Depression Scale:  Total score: 2  PHQ-9 Scoring interpretation table:  Score 0-4 = No depression  Score 5-9 = Mild depression  Score 10-14 = Moderate depression  Score 15-19 = Moderately severe depression  Score 20-27 = Severe depression (2.4 times higher risk of SUD and 2.89 times higher risk of overuse)   Pharmacologic Plan: As per protocol, I have not taken over any controlled substance management, pending the results of ordered tests and/or consults.            Initial impression: Pending review of available data and ordered tests.  Meds   Current Outpatient Medications:    ADVAIR  HFA 115-21 MCG/ACT inhaler, INHALE 2 PUFFS INTO THE LUNGS TWICE A DAY, Disp: 12 each, Rfl: 3   albuterol  (VENTOLIN  HFA) 108 (90 Base) MCG/ACT inhaler, INHALE 2 PUFFS INTO THE LUNGS EVERY 6 HOURS AS NEEDED FOR WHEEZING OR SHORTNESS OF BREATH, Disp: 6.7 g, Rfl: 5   Amikacin  Sulfate Liposome (ARIKAYCE ) 590 MG/8.4ML SUSP, Inhale 590 mg into the lungs daily., Disp: 252 mL, Rfl: 11   azithromycin  (ZITHROMAX ) 500 MG tablet, Take 1 tablet (500 mg total) by mouth daily., Disp: 30 tablet, Rfl: 11   clofazimine  50 mg CAPS capsule (for compassionate use), Take 100 mg by mouth daily with breakfast., Disp: , Rfl:    ethambutol  (MYAMBUTOL ) 400 MG tablet, Take 2.5 tablets (1,000 mg total) by mouth daily., Disp: 75 tablet, Rfl: 11   ibuprofen  (ADVIL ) 800 MG tablet, Take 800 mg by mouth in the morning and at bedtime., Disp: , Rfl:    Multiple Vitamin (MULTIVITAMIN WITH MINERALS) TABS tablet, Take 1 tablet by mouth in the morning. Centrum Gummies, Disp: , Rfl:    OVER THE COUNTER MEDICATION, Kraton, Disp: , Rfl:    tadalafil  (CIALIS ) 20 MG tablet, Take 1 tablet (20 mg total) by mouth daily as needed for erectile dysfunction., Disp: 30 tablet, Rfl:  5  Imaging Review  Cervical Imaging: Cervical MR wo contrast: No results found for this or any previous visit.  Cervical MR wo contrast: No results found for this or any previous visit.  Cervical MR w/wo contrast: No results found for this or any previous visit.  Cervical MR w contrast: No results found for this or any previous visit.  Cervical CT wo contrast: No results found for this or any previous visit.  Cervical CT w/wo contrast: No results found for this or any previous visit.  Cervical CT w/wo contrast: No results found for this or any previous visit.  Cervical CT w contrast: No results found for this or any previous visit.  Cervical CT outside: No results found for this or any previous visit.  Cervical DG 1 view: No results found for this or any previous visit.  Cervical DG 2-3 views: No results found for this or any previous visit.  Cervical DG F/E views: No results found for this or any previous visit.  Cervical  DG 2-3 clearing views: No results found for this or any previous visit.  Cervical DG Bending/F/E views: No results found for this or any previous visit.  Cervical DG complete: No results found for this or any previous visit.  Cervical DG Myelogram views: No results found for this or any previous visit.  Cervical DG Myelogram views: No results found for this or any previous visit.  Cervical Discogram views: No results found for this or any previous visit.   Shoulder Imaging: Shoulder-R MR w contrast: No results found for this or any previous visit.  Shoulder-L MR w contrast: No results found for this or any previous visit.  Shoulder-R MR w/wo contrast: No results found for this or any previous visit.  Shoulder-L MR w/wo contrast: No results found for this or any previous visit.  Shoulder-R MR wo contrast: No results found for this or any previous visit.  Shoulder-L MR wo contrast: No results found for this or any previous visit.  Shoulder-R CT w  contrast: No results found for this or any previous visit.  Shoulder-L CT w contrast: No results found for this or any previous visit.  Shoulder-R CT w/wo contrast: No results found for this or any previous visit.  Shoulder-L CT w/wo contrast: No results found for this or any previous visit.  Shoulder-R CT wo contrast: No results found for this or any previous visit.  Shoulder-L CT wo contrast: No results found for this or any previous visit.  Shoulder-R DG Arthrogram: No results found for this or any previous visit.  Shoulder-L DG Arthrogram: No results found for this or any previous visit.  Shoulder-R DG 1 view: No results found for this or any previous visit.  Shoulder-L DG 1 view: No results found for this or any previous visit.  Shoulder-R DG: No results found for this or any previous visit.  Shoulder-L DG: No results found for this or any previous visit.   Thoracic Imaging: Thoracic MR wo contrast: No results found for this or any previous visit.  Thoracic MR wo contrast: No results found for this or any previous visit.  Thoracic MR w/wo contrast: No results found for this or any previous visit.  Thoracic MR w contrast: No results found for this or any previous visit.  Thoracic CT wo contrast: No results found for this or any previous visit.  Thoracic CT w/wo contrast: No results found for this or any previous visit.  Thoracic CT w/wo contrast: No results found for this or any previous visit.  Thoracic CT w contrast: No results found for this or any previous visit.  Thoracic DG 2-3 views: No results found for this or any previous visit.  Thoracic DG 4 views: No results found for this or any previous visit.  Thoracic DG: No results found for this or any previous visit.  Thoracic DG w/swimmers view: No results found for this or any previous visit.  Thoracic DG Myelogram views: No results found for this or any previous visit.  Thoracic DG Myelogram views: No  results found for this or any previous visit.   Lumbosacral Imaging: Lumbar MR wo contrast: No results found for this or any previous visit.  Lumbar MR wo contrast: No results found for this or any previous visit.  Lumbar MR w/wo contrast: No results found for this or any previous visit.  Lumbar MR w/wo contrast: No results found for this or any previous visit.  Lumbar MR w contrast: No results found for this or any  previous visit.  Lumbar CT wo contrast: No results found for this or any previous visit.  Lumbar CT w/wo contrast: No results found for this or any previous visit.  Lumbar CT w/wo contrast: No results found for this or any previous visit.  Lumbar CT w contrast: No results found for this or any previous visit.  Lumbar DG 1V: No results found for this or any previous visit.  Lumbar DG 1V (Clearing): No results found for this or any previous visit.  Lumbar DG 2-3V (Clearing): No results found for this or any previous visit.  Lumbar DG 2-3 views: No results found for this or any previous visit.  Lumbar DG (Complete) 4+V: No results found for this or any previous visit.        Lumbar DG F/E views: No results found for this or any previous visit.        Lumbar DG Bending views: No results found for this or any previous visit.        Lumbar DG Myelogram views: No results found for this or any previous visit.  Lumbar DG Myelogram: No results found for this or any previous visit.  Lumbar DG Myelogram: No results found for this or any previous visit.  Lumbar DG Myelogram: No results found for this or any previous visit.  Lumbar DG Myelogram Lumbosacral: No results found for this or any previous visit.  Lumbar DG Diskogram views: No results found for this or any previous visit.  Lumbar DG Diskogram views: No results found for this or any previous visit.  Lumbar DG Epidurogram OP: No results found for this or any previous visit.  Lumbar DG Epidurogram IP: No  results found for this or any previous visit.   Sacroiliac Joint Imaging: Sacroiliac Joint DG: No results found for this or any previous visit.  Sacroiliac Joint MR w/wo contrast: No results found for this or any previous visit.  Sacroiliac Joint MR wo contrast: No results found for this or any previous visit.   Spine Imaging: Whole Spine DG Myelogram views: No results found for this or any previous visit.  Whole Spine MR Mets screen: No results found for this or any previous visit.  Whole Spine MR Mets screen: No results found for this or any previous visit.  Whole Spine MR w/wo: No results found for this or any previous visit.  MRA Spinal Canal w/ cm: No results found for this or any previous visit.  MRA Spinal Canal wo/ cm: No results found for this or any previous visit.  MRA Spinal Canal w/wo cm: No results found for this or any previous visit.  Spine Outside MR Films: No results found for this or any previous visit.  Spine Outside CT Films: No results found for this or any previous visit.  CT-Guided Biopsy: No results found for this or any previous visit.  CT-Guided Needle Placement: No results found for this or any previous visit.  DG Spine outside: No results found for this or any previous visit.  IR Spine outside: No results found for this or any previous visit.  NM Spine outside: No results found for this or any previous visit.   Hip Imaging: Hip-R MR w contrast: No results found for this or any previous visit.  Hip-L MR w contrast: No results found for this or any previous visit.  Hip-R MR w/wo contrast: No results found for this or any previous visit.  Hip-L MR w/wo contrast: No results found for this or any  previous visit.  Hip-R MR wo contrast: No results found for this or any previous visit.  Hip-L MR wo contrast: No results found for this or any previous visit.  Hip-R CT w contrast: No results found for this or any previous visit.  Hip-L CT w  contrast: No results found for this or any previous visit.  Hip-R CT w/wo contrast: No results found for this or any previous visit.  Hip-L CT w/wo contrast: No results found for this or any previous visit.  Hip-R CT wo contrast: No results found for this or any previous visit.  Hip-L CT wo contrast: No results found for this or any previous visit.  Hip-R DG 2-3 views: No results found for this or any previous visit.  Hip-L DG 2-3 views: No results found for this or any previous visit.  Hip-R DG Arthrogram: No results found for this or any previous visit.  Hip-L DG Arthrogram: No results found for this or any previous visit.  Hip-B DG Bilateral: No results found for this or any previous visit.  Hip-B DG Bilateral (5V): No results found for this or any previous visit.   Knee Imaging: Knee-R MR w contrast: No results found for this or any previous visit.  Knee-L MR w/o contrast: No results found for this or any previous visit.  Knee-R MR w/wo contrast: No results found for this or any previous visit.  Knee-L MR w/wo contrast: No results found for this or any previous visit.  Knee-R MR wo contrast: No results found for this or any previous visit.  Knee-L MR wo contrast: No results found for this or any previous visit.  Knee-R CT w contrast: No results found for this or any previous visit.  Knee-L CT w contrast: No results found for this or any previous visit.  Knee-R CT w/wo contrast: No results found for this or any previous visit.  Knee-L CT w/wo contrast: No results found for this or any previous visit.  Knee-R CT wo contrast: No results found for this or any previous visit.  Knee-L CT wo contrast: No results found for this or any previous visit.  Knee-R DG 1-2 views: No results found for this or any previous visit.  Knee-L DG 1-2 views: No results found for this or any previous visit.  Knee-R DG 3 views: No results found for this or any previous visit.  Knee-L  DG 3 views: No results found for this or any previous visit.  Knee-R DG 4 views: No results found for this or any previous visit.  Knee-L DG 4 views: No results found for this or any previous visit.  Knee-R DG Arthrogram: No results found for this or any previous visit.  Knee-L DG Arthrogram: No results found for this or any previous visit.   Ankle Imaging: Ankle-R DG Complete: No results found for this or any previous visit.  Ankle-L DG Complete: No results found for this or any previous visit.   Foot Imaging: Foot-R DG Complete: No results found for this or any previous visit.  Foot-L DG Complete: No results found for this or any previous visit.   Elbow Imaging: Elbow-R DG Complete: No results found for this or any previous visit.  Elbow-L DG Complete: No results found for this or any previous visit.   Wrist Imaging: Wrist-R DG Complete: No results found for this or any previous visit.  Wrist-L DG Complete: No results found for this or any previous visit.   Hand Imaging: Hand-R DG  Complete: No results found for this or any previous visit.  Hand-L DG Complete: No results found for this or any previous visit.   Complexity Note: Imaging results reviewed.                         ROS  Cardiovascular: No reported cardiovascular signs or symptoms such as High blood pressure, coronary artery disease, abnormal heart rate or rhythm, heart attack, blood thinner therapy or heart weakness and/or failure Pulmonary or Respiratory: Lung problems and Smoking Neurological: No reported neurological signs or symptoms such as seizures, abnormal skin sensations, urinary and/or fecal incontinence, being born with an abnormal open spine and/or a tethered spinal cord Psychological-Psychiatric: Depressed Gastrointestinal: No reported gastrointestinal signs or symptoms such as vomiting or evacuating blood, reflux, heartburn, alternating episodes of diarrhea and constipation, inflamed or  scarred liver, or pancreas or irrregular and/or infrequent bowel movements Genitourinary: No reported renal or genitourinary signs or symptoms such as difficulty voiding or producing urine, peeing blood, non-functioning kidney, kidney stones, difficulty emptying the bladder, difficulty controlling the flow of urine, or chronic kidney disease Hematological: No reported hematological signs or symptoms such as prolonged bleeding, low or poor functioning platelets, bruising or bleeding easily, hereditary bleeding problems, low energy levels due to low hemoglobin or being anemic Endocrine: No reported endocrine signs or symptoms such as high or low blood sugar, rapid heart rate due to high thyroid levels, obesity or weight gain due to slow thyroid or thyroid disease Rheumatologic: No reported rheumatological signs and symptoms such as fatigue, joint pain, tenderness, swelling, redness, heat, stiffness, decreased range of motion, with or without associated rash Musculoskeletal: Negative for myasthenia gravis, muscular dystrophy, multiple sclerosis or malignant hyperthermia Work History: Working full time  Allergies  Mr. Golson is allergic to penicillins.  Laboratory Chemistry Profile   Renal Lab Results  Component Value Date   BUN 8 10/09/2023   CREATININE 0.63 (L) 10/09/2023   BCR 13 10/09/2023   GFRAA >60 02/24/2019   GFRNONAA >60 06/09/2021     Electrolytes Lab Results  Component Value Date   NA 140 10/09/2023   K 3.7 10/09/2023   CL 104 10/09/2023   CALCIUM  8.7 10/09/2023     Hepatic Lab Results  Component Value Date   AST 18 10/09/2023   ALT 13 10/09/2023   ALBUMIN 3.8 06/09/2021   ALKPHOS 34 (L) 06/09/2021   LIPASE 14 04/05/2013     ID Lab Results  Component Value Date   HIV NON-REACTIVE 07/05/2021   SARSCOV2NAA POSITIVE (A) 06/06/2021   STAPHAUREUS NEGATIVE 02/24/2019   MRSAPCR NEGATIVE 02/24/2019     Bone Lab Results  Component Value Date   TESTOSTERONE  343  02/05/2023     Endocrine Lab Results  Component Value Date   GLUCOSE 82 10/09/2023   TESTOSTERONE  343 02/05/2023     Neuropathy Lab Results  Component Value Date   HIV NON-REACTIVE 07/05/2021     CNS No results found for: COLORCSF, APPEARCSF, RBCCOUNTCSF, WBCCSF, POLYSCSF, LYMPHSCSF, EOSCSF, PROTEINCSF, GLUCCSF, JCVIRUS, CSFOLI, IGGCSF, LABACHR, ACETBL   Inflammation (CRP: Acute  ESR: Chronic) Lab Results  Component Value Date   CRP <3.0 07/10/2023     Rheumatology Lab Results  Component Value Date   RF <14 07/05/2021   ANA NEGATIVE 07/05/2021     Coagulation Lab Results  Component Value Date   PLT 233 10/09/2023   DDIMER 1.03 (H) 04/04/2013     Cardiovascular Lab Results  Component Value  Date   HGB 15.5 10/09/2023   HCT 45.6 10/09/2023     Screening Lab Results  Component Value Date   SARSCOV2NAA POSITIVE (A) 06/06/2021   STAPHAUREUS NEGATIVE 02/24/2019   MRSAPCR NEGATIVE 02/24/2019   HIV NON-REACTIVE 07/05/2021     Cancer No results found for: CEA, CA125, LABCA2   Allergens No results found for: ALMOND, APPLE, ASPARAGUS, AVOCADO, BANANA, BARLEY, BASIL, BAYLEAF, GREENBEAN, LIMABEAN, WHITEBEAN, BEEFIGE, REDBEET, BLUEBERRY, BROCCOLI, CABBAGE, MELON, CARROT, CASEIN, CASHEWNUT, CAULIFLOWER, CELERY     Note: Lab results reviewed.  PFSH  Drug: Mr. Palau  reports no history of drug use. Alcohol:  reports current alcohol use of about 2.0 standard drinks of alcohol per week. Tobacco:  reports that he has been smoking cigarettes. He has a 5.6 pack-year smoking history. He has quit using smokeless tobacco. Medical:  has a past medical history of Allergy, Arthritis, Depression, DVT (deep venous thrombosis) (HCC) (2010), GERD (gastroesophageal reflux disease), pulmonary embolus (2014), Impingement syndrome of left ankle, PONV (postoperative nausea and vomiting), Smoker (12/02/2007), and  Tobacco use disorder. Family: family history includes Aortic dissection (age of onset: 32) in his mother; COPD in his mother; Cancer in his father; Hypertension in his father and mother.  Past Surgical History:  Procedure Laterality Date   ANKLE ARTHROSCOPY Left    ANKLE FUSION Left 02/24/2019   Procedure: LEFT OPEN ANKLE FUSION;  Surgeon: Harden Jerona GAILS, MD;  Location: [facility] OR;  Service: Orthopedics;  Laterality: Left;   BRONCHIAL BIOPSY  07/31/2021   Procedure: BRONCHIAL BIOPSIES;  Surgeon: Brenna Adine CROME, DO;  Location: [facility];  Service: Pulmonary;;   BRONCHIAL BRUSHINGS  07/31/2021   Procedure: BRONCHIAL BRUSHINGS;  Surgeon: Brenna Adine CROME, DO;  Location: [facility];  Service: Pulmonary;;   BRONCHIAL WASHINGS  07/31/2021   Procedure: BRONCHIAL WASHINGS;  Surgeon: Brenna Adine CROME, DO;  Location: [facility];  Service: Pulmonary;;   HAND SURGERY Left    fracture   Active Ambulatory Problems    Diagnosis Date Noted   History of pulmonary embolus (PE) 04/04/2013   Current smoker 04/04/2013   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   History of ankle fusion (Left) 02/24/2019   Cavitary lesion of lung 07/05/2021   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   Chronic cough 10/08/2021   Hearing loss 11/07/2021   Chronic pain syndrome 05/19/2024   Pharmacologic therapy 05/19/2024   Disorder of skeletal system 05/19/2024   Problems influencing health status 05/19/2024   History of penicillin allergy 05/19/2024   Chronic pain due to trauma 05/19/2024   Chronic ankle pain (1ry area of Pain) (Left) 05/19/2024   Abnormal CT scan of ankle (Left) (01/14/2020) 05/19/2024   Chronic lower extremity pain (Left) 05/19/2024   Chronic knee pain (Left) 05/19/2024   Chronic hip pain (Left) 05/19/2024   Intermittent low back pain 05/19/2024   History of substance use disorder 05/19/2024   Substance use disorder 05/19/2024   Resolved Ambulatory  Problems    Diagnosis Date Noted   Pleuritic chest pain 04/04/2013   Impingement syndrome of left ankle 03/27/2017   Post-traumatic osteoarthritis of left ankle    Rash 10/08/2021   Past Medical History:  Diagnosis Date   Allergy    Arthritis    Depression    DVT (deep venous thrombosis) (HCC) 2010   GERD (gastroesophageal reflux disease)    Hx of pulmonary embolus 2014   PONV (postoperative nausea and vomiting)    Smoker 12/02/2007   Tobacco use  disorder    Constitutional Exam  General appearance: Well nourished, well developed, and well hydrated. In no apparent acute distress Vitals:   05/19/24 0809  BP: 123/75  Pulse: 70  Temp: 97.8 F (36.6 C)  SpO2: 100%  Weight: 130 lb (59 kg)  Height: 5' 10 (1.778 m)   BMI Assessment: Estimated body mass index is 18.65 kg/m as calculated from the following:   Height as of this encounter: 5' 10 (1.778 m).   Weight as of this encounter: 130 lb (59 kg).  BMI interpretation table: BMI level Category Range association with higher incidence of chronic pain  <18 kg/m2 Underweight   18.5-24.9 kg/m2 Ideal body weight   25-29.9 kg/m2 Overweight Increased incidence by 20%  30-34.9 kg/m2 Obese (Class I) Increased incidence by 68%  35-39.9 kg/m2 Severe obesity (Class II) Increased incidence by 136%  >40 kg/m2 Extreme obesity (Class III) Increased incidence by 254%   Patient's current BMI Ideal Body weight  Body mass index is 18.65 kg/m. Ideal body weight: 73 kg (160 lb 15 oz)   BMI Readings from Last 4 Encounters:  05/19/24 18.65 kg/m  04/07/24 20.29 kg/m  02/17/24 19.91 kg/m  11/27/23 19.91 kg/m   Wt Readings from Last 4 Encounters:  05/19/24 130 lb (59 kg)  04/07/24 137 lb 6.4 oz (62.3 kg)  02/17/24 134 lb 12.8 oz (61.1 kg)  11/27/23 134 lb 12.8 oz (61.1 kg)    Psych/Mental status: Alert, oriented x 3 (person, place, & time)       Eyes: PERLA Respiratory: No evidence of acute respiratory distress  Assessment   Primary Diagnosis & Pertinent Problem List: The primary encounter diagnosis was Chronic ankle pain (Left). Diagnoses of Abnormal CT scan of ankle (Left) (01/14/2020), Chronic lower extremity pain (Left), Chronic knee pain (Left), Chronic hip pain (Left), Intermittent low back pain, Chronic pain due to trauma, Chronic pain syndrome, Pharmacologic therapy, History of substance use disorder, Substance use disorder, Disorder of skeletal system, and Problems influencing health status were also pertinent to this visit.  Visit Diagnosis (New problems to examiner): 1. Chronic ankle pain (Left)   2. Abnormal CT scan of ankle (Left) (01/14/2020)   3. Chronic lower extremity pain (Left)   4. Chronic knee pain (Left)   5. Chronic hip pain (Left)   6. Intermittent low back pain   7. Chronic pain due to trauma   8. Chronic pain syndrome   9. Pharmacologic therapy   10. History of substance use disorder   11. Substance use disorder   12. Disorder of skeletal system   13. Problems influencing health status    Plan of Care (Initial workup plan)  Note: Mr. Dralle was reminded that as per protocol, today's visit has been an evaluation only. We have not taken over the patient's controlled substance management.  Problem-specific plan: Assessment and Plan    Chronic left ankle and foot pain after trauma and incomplete fusion   Chronic pain persists in the left ankle and foot following trauma and incomplete fusion, with pain radiating from the heel to the hip. Previous surgeries, including a clean-out and a fusion in 2021, were not fully successful. Pain management has transitioned from cortisone injections and opioids to Kratom. Order x-rays of the left ankle and foot. Evaluate pain management options after reviewing x-ray results.  Possible disorder of left ankle and foot bones   Ongoing pain and incomplete fusion suggest a potential disorder of the left ankle and foot bones. Previous imaging was  conducted over  a year ago, with no recent nerve conduction tests. Order x-rays of the left ankle and foot.  Possible lumbar spine instability   Consider possible lumbar spine instability due to pain radiating from the left ankle to the hip and occasional back pain. There is no history of back surgery or significant back issues, but high Kratom intake may mask pain. Order x-rays of the lumbar spine with flexion and extension views to assess for instability.  Kratom use disorder with withdrawal symptoms   Kratom use disorder is present with significant withdrawal symptoms such as flu-like symptoms, shaking, and uneasiness. He currently takes 40-45 pills per day, with his wife's estimate being higher. Withdrawal management and cessation of Kratom use is a primary goal. Discussed the need to taper Kratom intake gradually to avoid withdrawal symptoms. His wife will manage Kratom intake to accurately track usage. Gradually taper Kratom intake by reducing one pill per day each week. Refer to a psychologist if psychological cravings persist after managing physiological withdrawal.       Lab Orders         Compliance Drug Analysis, Ur         Comp. Metabolic Panel (12)         Magnesium          Vitamin B12         Sedimentation rate         25-Hydroxy vitamin D  Lcms D2+D3         C-reactive protein     Imaging Orders         DG Ankle Complete Left         DG Lumbar Spine Complete W/Bend         DG HIP UNILAT W OR W/O PELVIS 2-3 VIEWS LEFT         DG Knee Complete 4 Views Left     Referral Orders  No referral(s) requested today   Procedure Orders    No procedure(s) ordered today   Pharmacotherapy (current): Medications ordered:  No orders of the defined types were placed in this encounter.  Medications administered during this visit: Jeren Dufrane. Esco had no medications administered during this visit.   Analgesic Pharmacotherapy:  Opioid Analgesics: For patients currently taking or requesting to take  opioid analgesics, in accordance with [place]  Medical Board Guidelines, we will assess their risks and indications for the use of these substances. After completing our evaluation, we may offer recommendations, but we no longer take patients for medication management. The prescribing physician will ultimately decide, based on his/her training and level of comfort whether to adopt any of the recommendations, including whether or not to prescribe such medicines.  Membrane stabilizer: To be determined at a later time  Muscle relaxant: To be determined at a later time  NSAID: To be determined at a later time  Other analgesic(s): To be determined at a later time   Interventional management options: Mr. Swigert was informed that there is no guarantee that he would be a candidate for interventional therapies. The decision will be based on the results of diagnostic studies, as well as Mr. Devereux risk profile.  Procedure(s) under consideration:  Pending results of ordered studies     Interventional Therapies  Risk Factors  Considerations  Medical Comorbidities:  Smoker  cavitary lesion of lung  Hx. DVT  Hx. PE  GERD  SUD  Kraton Addiction     Planned  Pending:      Under  consideration:   Pending   Completed: (Analgesic benefit)1  None at this time   Therapeutic  Palliative (PRN) options:   None established   Completed by other providers:   None reported  1(Analgesic benefit): Expressed in percentage (%). (Local anesthetic[LA] +/- sedation  L.A.Local Anesthetic  Steroid benefit  Ongoing benefit)   Provider-requested follow-up: Return in about 2 weeks (around 06/02/2024) for ( ), Eval-day (M,W), (Face2F), 2nd Visit, to review of ordered test(s).  Future Appointments  Date Time Provider Department Center  06/16/2024  2:00 PM Tanya Glisson, MD [facility] None  06/17/2024  4:00 PM Vu, Constance DASEN, MD [facility]   I discussed the assessment and treatment plan with the  patient. The patient was provided an opportunity to ask questions and all were answered. The patient agreed with the plan and demonstrated an understanding of the instructions.  Patient advised to call back or seek an in-person evaluation if the symptoms or condition worsens.  Duration of encounter: 85 minutes.  Total time on encounter, as per AMA guidelines included both the face-to-face and non-face-to-face time personally spent by the physician and/or other qualified health care professional(s) on the day of the encounter (includes time in activities that require the physician or other qualified health care professional and does not include time in activities normally performed by clinical staff). Physician's time may include the following activities when performed: Preparing to see the patient (e.g., pre-charting review of records, searching for previously ordered imaging, lab work, and nerve conduction tests) Review of prior analgesic pharmacotherapies. Reviewing PMP Interpreting ordered tests (e.g., lab work, imaging, nerve conduction tests) Performing post-procedure evaluations, including interpretation of diagnostic procedures Obtaining and/or reviewing separately obtained history Performing a medically appropriate examination and/or evaluation Counseling and educating the patient/family/caregiver Ordering medications, tests, or procedures Referring and communicating with other health care professionals (when not separately reported) Documenting clinical information in the electronic or other health record Independently interpreting results (not separately reported) and communicating results to the patient/ family/caregiver Care coordination (not separately reported)  Note by: Glisson DELENA Tanya, MD (TTS and AI technology used. I apologize for any typographical errors that were not detected and corrected.) Date: 05/19/2024; Time: 10:05 AM

## 2024-05-21 ENCOUNTER — Ambulatory Visit: Payer: Self-pay | Admitting: Internal Medicine

## 2024-05-23 LAB — COMPLIANCE DRUG ANALYSIS, UR

## 2024-05-28 LAB — COMP. METABOLIC PANEL (12)
AST: 18 IU/L (ref 0–40)
Albumin: 4 g/dL (ref 3.8–4.9)
Alkaline Phosphatase: 46 IU/L — ABNORMAL LOW (ref 47–123)
BUN/Creatinine Ratio: 12 (ref 9–20)
BUN: 9 mg/dL (ref 6–24)
Bilirubin Total: 0.4 mg/dL (ref 0.0–1.2)
Calcium: 8.8 mg/dL (ref 8.7–10.2)
Chloride: 102 mmol/L (ref 96–106)
Creatinine, Ser: 0.75 mg/dL — ABNORMAL LOW (ref 0.76–1.27)
Globulin, Total: 2.3 g/dL (ref 1.5–4.5)
Glucose: 82 mg/dL (ref 70–99)
Potassium: 4 mmol/L (ref 3.5–5.2)
Sodium: 142 mmol/L (ref 134–144)
Total Protein: 6.3 g/dL (ref 6.0–8.5)
eGFR: 106 mL/min/1.73 (ref >59)

## 2024-05-28 LAB — VITAMIN B12: Vitamin B-12: 1234 pg/mL (ref 232–1245)

## 2024-05-28 LAB — 25-HYDROXY VITAMIN D LCMS D2+D3
25-Hydroxy, Vitamin D-2: <1 ng/mL
25-Hydroxy, Vitamin D-3: 35 ng/mL
25-Hydroxy, Vitamin D: 36 ng/mL

## 2024-05-28 LAB — C-REACTIVE PROTEIN: CRP: 7 mg/L (ref 0–10)

## 2024-05-28 LAB — MAGNESIUM: Magnesium: 1.6 mg/dL (ref 1.6–2.3)

## 2024-05-28 LAB — SEDIMENTATION RATE: Sed Rate: 9 mm/h (ref 0–30)

## 2024-06-15 NOTE — Progress Notes (Deleted)
 PROVIDER NOTE: Interpretation of information contained herein should be left to medically-trained personnel. Specific patient instructions are provided elsewhere under Patient Instructions section of medical record. This document was created in part using AI and STT-dictation technology, any transcriptional errors that may result from this process are unintentional.  Patient: Gerald Fernandez  Service: E/M   PCP: Gerald Norleen BROCKS, MD  DOB: [birth year]  DOS: 06/16/2024  Provider: Eric DELENA Como, MD  MRN: [id number]  Delivery: Face-to-face  Specialty: Interventional Pain Management  Type: Established Patient  Setting: Ambulatory outpatient facility  Specialty designation: 09  Referring Prov.: Gerald Norleen BROCKS, MD  Location: Outpatient office facility       Primary Reason(s) for Visit: Encounter for evaluation before starting new chronic pain management plan of care (Level of risk: moderate) CC: No chief complaint on file.  HPI  Gerald Fernandez is a 58 y.o. year old, male patient, who comes today for a follow-up evaluation to review the test results and decide on a treatment plan. 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 His primarily concern today is the No chief complaint on file.  Pain Assessment: Location:     Radiating:   Onset:   Duration:   Quality:    Severity:  /10 (subjective, self-reported pain score)  Effect on ADL:   Timing:   Modifying factors:   BP:    HR:    Gerald Fernandez comes in today for a follow-up visit after his initial evaluation on 05/19/2024. Today we went over the results of his tests. These were explained in Layman's terms. During today's appointment we went over my diagnostic impression, as well as the proposed treatment plan.  Review of initial evaluation (05/19/2024): Gerald Fernandez is a 58 year old male who presents with chronic left ankle pain and substance use concerns. He is accompanied by his wife, Gerald Fernandez.   He sustained a left ankle injury over ten years ago, leading to a 'clean out' procedure and a subsequent fusion surgery. He believes the fusion did not fully integrate, and he later fractured his talus bone. He experiences pain in his left heel, ankle, and radiating pain up to his shin, knee, and hip. He rarely experiences back pain.   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 He consumes 40 to 45 Kratom pills daily, taking 8 to 12 pills at a time, four times a day. He experiences withdrawal symptoms, including flu-like symptoms, shaking, and unease when attempting to stop Kratom.   He has had two surgeries on his left ankle, the last in 2021, with no postoperative infections. He has not undergone physical therapy or nerve conduction studies. He receives cortisone injections in the ankle joint, two to three times per year. He has not had recent imaging studies or interventional therapies such as nerve blocks or implants for pain management.   Review of diagnostic  test ordered on 05/19/2024:  Diagnostic lab work: *** Diagnostic imaging: ***  Discussed the use of AI scribe software for clinical note transcription with the patient, who gave verbal consent to proceed.  History of Present Illness          Patient presented with  interventional treatment options. Gerald Fernandez was informed that I will not be providing medication management. Pharmacotherapy evaluation including recommendations may be offered, if specifically requested.   Controlled Substance Pharmacotherapy Assessment REMS (Risk Evaluation and Mitigation Strategy)  Opioid Analgesic: None MME/day: 0 mg/day   Pill Count: None expected due to no prior prescriptions written by our practice. No notes on file  Pharmacokinetics: Liberation and absorption (onset of action): WNL Distribution (time to peak effect): WNL Metabolism and excretion (duration of action): WNL         Pharmacodynamics: Desired effects: Analgesia: Gerald Fernandez reports >50% benefit. Functional ability: Patient reports that medication allows him to accomplish basic ADLs Clinically meaningful improvement in function (CMIF): Sustained CMIF goals met Perceived effectiveness: Described as relatively effective, allowing for increase in activities of daily living (ADL) Undesirable effects: Side-effects or Adverse reactions: None reported Monitoring: [place] PMP: PDMP reviewed during this encounter. Online review of the past 68-month period previously conducted. Not applicable at this point since we have not taken over the patient's medication management yet. List of other Serum/Urine Drug Screening Test(s):  No results found for: AMPHSCRSER, BARBSCRSER, BENZOSCRSER, COCAINSCRSER, COCAINSCRNUR, PCPSCRSER, THCSCRSER, THCU, CANNABQUANT, OPIATESCRSER, OXYSCRSER, PROPOXSCRSER, ETH, CBDTHCR, D8THCCBX, D9THCCBX List of all UDS test(s) done:  Lab Results  Component Value Date   SUMMARY FINAL 05/19/2024   Last UDS on record: Summary  Date Value Ref Range Status  05/19/2024 FINAL  Final    Comment:    ==================================================================== Compliance Drug Analysis, Ur ==================================================================== Test                              Result       Flag       Units  Drug Present not Declared for Prescription Verification   Acetaminophen                   PRESENT      UNEXPECTED   Doxylamine                     PRESENT      UNEXPECTED   Dextrorphan/Levorphanol        PRESENT      UNEXPECTED    Dextrorphan is an expected metabolite of dextromethorphan, an over-    the-counter or prescription cough suppressant. Levorphanol is a    scheduled prescription medication. Dextrorphan cannot be    distinguished from levorphanol by the method used for analysis.  Drug Absent but Declared for Prescription Verification   Ibuprofen                       Not Detected UNEXPECTED    Ibuprofen , as indicated in the declared medication list, is not    always detected even when used as directed.  ==================================================================== Test                      Result    Flag   Units      Ref Range   Creatinine              53  mg/dL      >=79 ==================================================================== Declared Medications:  The flagging and interpretation on this report are based on the  following declared medications.  Unexpected results may arise from  inaccuracies in the declared medications.   **Note: The testing scope of this panel does not include small to  moderate amounts of these reported medications:   Ibuprofen  (Advil )   **Note: The testing scope of this panel does not include the  following reported medications:   Albuterol  (Ventolin  HFA)  Amikacin  (Arikayce )  Azithromycin  (Zithromax )  Clofazimine   Ethambutol  (Myambutol )  Fluticasone (Advair )  Kratom  Multivitamin  Salmeterol (Advair )  Tadalafil  (Cialis ) ==================================================================== For clinical consultation, please call [phone]. ====================================================================    UDS interpretation: No unexpected  findings.          Medication Assessment Form: Not applicable. No opioids. Treatment compliance: Not applicable Risk Assessment Profile: Aberrant behavior: See initial evaluations. None observed or detected today Comorbid factors increasing risk of overdose: See initial evaluation. No additional risks detected today Opioid risk tool (ORT):     05/19/2024    8:13 AM  Opioid Risk   Alcohol 3  Illegal Drugs 0  Rx Drugs 0  Alcohol 0  Illegal Drugs 0  Rx Drugs 5  Age between 16-45 years  0  Psychological Disease 0  Depression 0  Opioid Risk Tool Scoring 8  Opioid Risk Interpretation High Risk    ORT Scoring interpretation table:  Score <3 = Low Risk for SUD  Score between 4-7 = Moderate Risk for SUD  Score >8 = High Risk for Opioid Abuse   Risk of substance use disorder (SUD): Low  Risk Mitigation Strategies:  Patient opioid safety counseling: No controlled substances prescribed. Patient-Prescriber Agreement (PPA): No agreement signed.  Controlled substance notification to other providers: None required. No opioid therapy.  Pharmacologic Plan: Non-opioid analgesic therapy offered. Interventional alternatives discussed.             Laboratory Chemistry Profile   Renal Lab Results  Component Value Date   BUN 9 05/19/2024   CREATININE 0.75 (L) 05/19/2024   BCR 12 05/19/2024   GFRAA >60 02/24/2019   GFRNONAA >60 06/09/2021     Electrolytes Lab Results  Component Value Date   NA 142 05/19/2024   K 4.0 05/19/2024   CL 102 05/19/2024   CALCIUM  8.8 05/19/2024   MG 1.6 05/19/2024     Hepatic Lab Results  Component Value Date   AST 18 05/19/2024   ALT 13 10/09/2023   ALBUMIN 4.0 05/19/2024   ALKPHOS 46 (L) 05/19/2024   LIPASE 14 04/05/2013     ID Lab Results  Component Value Date   HIV NON-REACTIVE 07/05/2021   SARSCOV2NAA POSITIVE (A) 06/06/2021   STAPHAUREUS NEGATIVE 02/24/2019   MRSAPCR NEGATIVE 02/24/2019     Bone Lab Results  Component Value Date    25OHVITD1 36 05/19/2024   25OHVITD2 <1.0 05/19/2024   25OHVITD3 35 05/19/2024   TESTOSTERONE  343 02/05/2023     Endocrine Lab Results  Component Value Date   GLUCOSE 82 05/19/2024   TESTOSTERONE  343 02/05/2023     Neuropathy Lab Results  Component Value Date   VITAMINB12 1,234 05/19/2024   HIV NON-REACTIVE 07/05/2021     CNS No results found for: COLORCSF, APPEARCSF, RBCCOUNTCSF, WBCCSF, POLYSCSF, LYMPHSCSF, EOSCSF, PROTEINCSF, GLUCCSF, JCVIRUS, CSFOLI, IGGCSF, LABACHR, ACETBL   Inflammation (CRP: Acute  ESR: Chronic) Lab Results  Component Value Date   CRP 7 05/19/2024   ESRSEDRATE 9 05/19/2024  Rheumatology Lab Results  Component Value Date   RF <14 07/05/2021   ANA NEGATIVE 07/05/2021     Coagulation Lab Results  Component Value Date   PLT 233 10/09/2023   DDIMER 1.03 (H) 04/04/2013     Cardiovascular Lab Results  Component Value Date   HGB 15.5 10/09/2023   HCT 45.6 10/09/2023     Screening Lab Results  Component Value Date   SARSCOV2NAA POSITIVE (A) 06/06/2021   STAPHAUREUS NEGATIVE 02/24/2019   MRSAPCR NEGATIVE 02/24/2019   HIV NON-REACTIVE 07/05/2021     Cancer No results found for: CEA, CA125, LABCA2   Allergens No results found for: ALMOND, APPLE, ASPARAGUS, AVOCADO, BANANA, BARLEY, BASIL, BAYLEAF, GREENBEAN, LIMABEAN, WHITEBEAN, BEEFIGE, REDBEET, BLUEBERRY, BROCCOLI, CABBAGE, MELON, CARROT, CASEIN, CASHEWNUT, CAULIFLOWER, CELERY     Note: Lab results reviewed.  Recent Diagnostic Imaging Review  Lumbosacral Imaging: Lumbar DG Bending views: Results for orders placed during the hospital encounter of 05/19/24 DG Lumbar Spine Complete W/Bend  Narrative CLINICAL DATA:  Low back pain  EXAM: LUMBAR SPINE - COMPLETE WITH BENDING VIEWS  COMPARISON:  None Available.  FINDINGS: Aortic atherosclerosis. Trace retrolisthesis L2 on L3, L3 on L4 and L4 on L5.  Vertebral body heights are maintained. Mild disc space narrowing at L1-L2, L2-L3, moderate disc space narrowing at L4-L5 and advanced disc space narrowing at L5-S1. No suspicious change in alignment with flexion or extension. Mild lower lumbar facet degenerative changes  IMPRESSION: Multilevel degenerative changes, most advanced at L5-S1.   Electronically Signed By: Luke Bun M.D. On: 05/23/2024 21:38  Knee Imaging: Knee-L DG 4 views: Results for orders placed during the hospital encounter of 05/19/24 DG Knee Complete 4 Views Left  Narrative CLINICAL DATA:  Left-sided knee pain  EXAM: LEFT KNEE - COMPLETE 4+ VIEW  COMPARISON:  None Available.  FINDINGS: No fracture or malalignment. Joint space calcifications consistent with chondrocalcinosis. Mild medial and patellofemoral degenerative changes. Probable tiny knee effusion  IMPRESSION: Mild degenerative changes with chondrocalcinosis.   Electronically Signed By: Luke Bun M.D. On: 05/23/2024 21:39  Ankle Imaging: Ankle-L DG Complete: Results for orders placed during the hospital encounter of 05/19/24 DG Ankle Complete Left  Narrative CLINICAL DATA:  Ankle pain  EXAM: LEFT ANKLE COMPLETE - 3+ VIEW  COMPARISON:  CT 01/14/2020, 06/17/2019  FINDINGS: No acute fracture is seen. Prior tibiotalar arthrodesis with similar appearance of anterior fixating plate and multiple screws. Similar mild lucency around the fixating screws in the anterior talus on lateral view. Moderate plantar calcaneal spur and mild plantar calcifications. Slight progressive degenerative changes at the distal tibiofibular articulation.  IMPRESSION: 1. No acute osseous abnormality. 2. Prior tibiotalar arthrodesis with stable appearance of the hardware. 3. Slight progressive degenerative changes at the distal tibiofibular articulation.   Electronically Signed By: Luke Bun M.D. On: 05/23/2024 21:36  Complexity Note:  Imaging results reviewed.                         Meds   Current Outpatient Medications:    ADVAIR  HFA 115-21 MCG/ACT inhaler, INHALE 2 PUFFS INTO THE LUNGS TWICE A DAY, Disp: 12 each, Rfl: 3   albuterol  (VENTOLIN  HFA) 108 (90 Base) MCG/ACT inhaler, INHALE 2 PUFFS INTO THE LUNGS EVERY 6 HOURS AS NEEDED FOR WHEEZING OR SHORTNESS OF BREATH, Disp: 6.7 g, Rfl: 5   Amikacin  Sulfate Liposome (ARIKAYCE ) 590 MG/8.4ML SUSP, Inhale 590 mg into the lungs daily., Disp: 252 mL, Rfl: 11   azithromycin  (ZITHROMAX )  500 MG tablet, Take 1 tablet (500 mg total) by mouth daily., Disp: 30 tablet, Rfl: 11   clofazimine  50 mg CAPS capsule (for compassionate use), Take 100 mg by mouth daily with breakfast., Disp: , Rfl:    ethambutol  (MYAMBUTOL ) 400 MG tablet, Take 2.5 tablets (1,000 mg total) by mouth daily., Disp: 75 tablet, Rfl: 11   ibuprofen  (ADVIL ) 800 MG tablet, Take 800 mg by mouth in the morning and at bedtime., Disp: , Rfl:    Multiple Vitamin (MULTIVITAMIN WITH MINERALS) TABS tablet, Take 1 tablet by mouth in the morning. Centrum Gummies, Disp: , Rfl:    OVER THE COUNTER MEDICATION, Kraton, Disp: , Rfl:    tadalafil  (CIALIS ) 20 MG tablet, Take 1 tablet (20 mg total) by mouth daily as needed for erectile dysfunction., Disp: 30 tablet, Rfl: 5  ROS  Constitutional: Denies any fever or chills Gastrointestinal: No reported hemesis, hematochezia, vomiting, or acute GI distress Musculoskeletal: Denies any acute onset joint swelling, redness, loss of ROM, or weakness Neurological: No reported episodes of acute onset apraxia, aphasia, dysarthria, agnosia, amnesia, paralysis, loss of coordination, or loss of consciousness  Allergies  Mr. Kawai is allergic to penicillins.  PFSH  Drug: Mr. Andres  reports no history of drug use. Alcohol:  reports current alcohol use of about 2.0 standard drinks of alcohol per week. Tobacco:  reports that he has been smoking cigarettes. He has a 5.6 pack-year smoking history. He has  quit using smokeless tobacco. Medical:  has a past medical history of Allergy, Arthritis, Depression, DVT (deep venous thrombosis) (HCC) (2010), GERD (gastroesophageal reflux disease), pulmonary embolus (2014), Impingement syndrome of left ankle, PONV (postoperative nausea and vomiting), Smoker (12/02/2007), and Tobacco use disorder. Surgical: Mr. Fohl  has a past surgical history that includes Hand surgery (Left); Ankle arthroscopy (Left); Ankle fusion (Left, 02/24/2019); Bronchial biopsy (07/31/2021); Bronchial washings (07/31/2021); and Bronchial brushings (07/31/2021). Family: family history includes Aortic dissection (age of onset: 38) in his mother; COPD in his mother; Cancer in his father; Hypertension in his father and mother.  Constitutional Exam  General appearance: Well nourished, well developed, and well hydrated. In no apparent acute distress There were no vitals filed for this visit. BMI Assessment: Estimated body mass index is 18.65 kg/m as calculated from the following:   Height as of 05/19/24: 5' 10 (1.778 m).   Weight as of 05/19/24: 130 lb (59 kg).  BMI interpretation table: BMI level Category Range association with higher incidence of chronic pain  <18 kg/m2 Underweight   18.5-24.9 kg/m2 Ideal body weight   25-29.9 kg/m2 Overweight Increased incidence by 20%  30-34.9 kg/m2 Obese (Class I) Increased incidence by 68%  35-39.9 kg/m2 Severe obesity (Class II) Increased incidence by 136%  >40 kg/m2 Extreme obesity (Class III) Increased incidence by 254%   Patient's current BMI Ideal Body weight  There is no height or weight on file to calculate BMI. Patient weight not recorded   BMI Readings from Last 4 Encounters:  05/19/24 18.65 kg/m  04/07/24 20.29 kg/m  02/17/24 19.91 kg/m  11/27/23 19.91 kg/m   Wt Readings from Last 4 Encounters:  05/19/24 130 lb (59 kg)  04/07/24 137 lb 6.4 oz (62.3 kg)  02/17/24 134 lb 12.8 oz (61.1 kg)  11/27/23 134 lb 12.8 oz (61.1 kg)     Psych/Mental status: Alert, oriented x 3 (person, place, & time)       Eyes: PERLA Respiratory: No evidence of acute respiratory distress  Assessment & Plan  Primary  Diagnosis & Pertinent Problem List: There were no encounter diagnoses. Visit Diagnosis: No diagnosis found. Problems updated and reviewed during this visit: No problems updated.  Plan of Care  Assessment and Plan             Pharmacotherapy (Medications Ordered): No orders of the defined types were placed in this encounter.  Procedure Orders    No procedure(s) ordered today   No orders of the defined types were placed in this encounter.  Lab Orders  No laboratory test(s) ordered today   Imaging Orders  No imaging studies ordered today   Referral Orders  No referral(s) requested today    Pharmacological management:  Opioid Analgesics: I will not be prescribing any opioids at this time Membrane stabilizer: I will not be prescribing any at this time Muscle relaxant: I will not be prescribing any at this time NSAID: I will not be prescribing any at this time Other analgesic(s): I will not be prescribing any at this time      Interventional Therapies  Risk Factors  Considerations  Medical Comorbidities:  Smoker  cavitary lesion of lung  Hx. DVT  Hx. PE  GERD  SUD  Kraton Addiction     Planned  Pending:      Under consideration:   Pending   Completed: (Analgesic benefit)1  None at this time   Therapeutic  Palliative (PRN) options:   None established   Completed by other providers:   None reported  1(Analgesic benefit): Expressed in percentage (%). (Local anesthetic[LA] +/- sedation  L.A.Local Anesthetic  Steroid benefit  Ongoing benefit)      Provider-requested follow-up: No follow-ups on file. Recent Visits Date Type Provider Dept  05/19/24 Office Visit Tanya Glisson, MD [facility]  Showing recent visits within past 90 days and meeting all other  requirements Future Appointments Date Type Provider Dept  06/16/24 Appointment Tanya Glisson, MD [facility]  Showing future appointments within next 90 days and meeting all other requirements   Primary Care Physician: Gerald Norleen BROCKS, MD  Duration of encounter: *** minutes.  Total time on encounter, as per AMA guidelines included both the face-to-face and non-face-to-face time personally spent by the physician and/or other qualified health care professional(s) on the day of the encounter (includes time in activities that require the physician or other qualified health care professional and does not include time in activities normally performed by clinical staff). Physician's time may include the following activities when performed: Preparing to see the patient (e.g., pre-charting review of records, searching for previously ordered imaging, lab work, and nerve conduction tests) Review of prior analgesic pharmacotherapies. Reviewing PMP Interpreting ordered tests (e.g., lab work, imaging, nerve conduction tests) Performing post-procedure evaluations, including interpretation of diagnostic procedures Obtaining and/or reviewing separately obtained history Performing a medically appropriate examination and/or evaluation Counseling and educating the patient/family/caregiver Ordering medications, tests, or procedures Referring and communicating with other health care professionals (when not separately reported) Documenting clinical information in the electronic or other health record Independently interpreting results (not separately reported) and communicating results to the patient/ family/caregiver Care coordination (not separately reported)  Note by: Glisson DELENA Tanya, MD (TTS technology used. I apologize for any typographical errors that were not detected and corrected.) Date: 06/16/2024; Time: 11:45 AM

## 2024-06-16 ENCOUNTER — Ambulatory Visit: Admitting: Pain Medicine

## 2024-06-17 ENCOUNTER — Other Ambulatory Visit: Payer: Self-pay

## 2024-06-17 ENCOUNTER — Ambulatory Visit: Admitting: Internal Medicine

## 2024-06-17 ENCOUNTER — Encounter: Payer: Self-pay | Admitting: Internal Medicine

## 2024-06-17 VITALS — BP 120/72 | HR 82 | Temp 98.0°F | Resp 18 | Ht 70.0 in | Wt 130.0 lb

## 2024-06-17 DIAGNOSIS — A319 Mycobacterial infection, unspecified: Secondary | ICD-10-CM

## 2024-06-17 DIAGNOSIS — A318 Other mycobacterial infections: Secondary | ICD-10-CM

## 2024-06-17 DIAGNOSIS — J449 Chronic obstructive pulmonary disease, unspecified: Secondary | ICD-10-CM | POA: Diagnosis not present

## 2024-06-17 DIAGNOSIS — F17210 Nicotine dependence, cigarettes, uncomplicated: Secondary | ICD-10-CM

## 2024-06-17 DIAGNOSIS — Z79899 Other long term (current) drug therapy: Secondary | ICD-10-CM

## 2024-06-17 NOTE — Patient Instructions (Signed)
 Let's get sputum cultures again in end of 08/2024 If these are negative will stop antibiotics and observe you    See me in 10/2024 as this will give enough time for sputum submission/growth    Please send me mychart message to order sputum near end of 08/2024

## 2024-06-17 NOTE — Progress Notes (Signed)
 [facility]  CHIEF COMPLAINT:    Follow up for MAC infection  SUBJECTIVE:    Gerald Fernandez is a 58 y.o. male with PMHx as below who presents to the clinic for MAC lung infection.    06/17/24 id clinic f/u Here for routine f/u Feels the same stable good functional status No f/c No n/v/diarrhea No rash  02/17/24 id clinic f/u 3 months f/u See a&p for detail   03/20/23 id clinic f/u He was seen last by my partner dr Prentiss 12/2022. He was previously seen at [facility] as well He continues to smoke He has cavitary/nodular process 09/2022 cx negative after persistently positive prior to that.  He has been on therapy since 08/2021 Current regimen azith/ethambutol , clofaz, and inhaled amikacin    --------- He was last seen on 09/18/22 and also saw Dr Florian at [facility] on 06/17/22.  He is currently on a regimen of Azithromycin  500mg  PO daily, Ethambutol  1000mg  PO daily, Clofazimine  100mg  PO daily, and inhaled Amikacin .     His most recent AFB sputum cultures from 09/18/22 were finally negative after persistently positive cultures from time of initial diagnosis in December 2022.  Regarding his symptoms, he reports he is doing well today and no major complaints or symptoms.  He reports he continues to have cut back on his tobacco use.  He is tolerating antibiotics with no issues.  He had a repeat CT chest in January 2024 that overall showed stability with some areas of improvement.    From Dr Jaquita note, I personally reviewed his chest CT films from 06/2021 and 03/2022 with Mr. Stay and his wife. Both the larger right upper lobe cavitary lesion and the smaller left upper lobe cavitary lesion have filled in and I can see some calcifications in the center of the right upper lobe lesion. I do not see any middle or lower lobe disease and he does not have a lot of surrounding emphysema. In theory he has anatomically resectable disease but he has minimal symptoms and  what looks to me like radiographic improvement on therapy. Given that and ongoing tobacco use, I don't think that pursuing surgical resection is a good idea at this point. I would recommend trying to optimize his medical therapy, working on smoking cessation, and close monitoring.   Please see A&P for the details of today's visit and status of the patient's medical problems.   Patient's Medications  New Prescriptions   No medications on file  Previous Medications   ADVAIR  HFA 115-21 MCG/ACT INHALER    INHALE 2 PUFFS INTO THE LUNGS TWICE A DAY   ALBUTEROL  (VENTOLIN  HFA) 108 (90 BASE) MCG/ACT INHALER    INHALE 2 PUFFS INTO THE LUNGS EVERY 6 HOURS AS NEEDED FOR WHEEZING OR SHORTNESS OF BREATH   AMIKACIN  SULFATE LIPOSOME (ARIKAYCE ) 590 MG/8.4ML SUSP    Inhale 590 mg into the lungs daily.   AZITHROMYCIN  (ZITHROMAX ) 500 MG TABLET    Take 1 tablet (500 mg total) by mouth daily.   CLOFAZIMINE  50 MG CAPS CAPSULE (FOR COMPASSIONATE USE)    Take 100 mg by mouth daily with breakfast.   ETHAMBUTOL  (MYAMBUTOL ) 400 MG TABLET    Take 2.5 tablets (1,000 mg total) by mouth daily.   IBUPROFEN  (ADVIL ) 800 MG TABLET    Take 800 mg by mouth in the morning and at bedtime.   MULTIPLE VITAMIN (MULTIVITAMIN WITH MINERALS) TABS TABLET    Take 1 tablet by  mouth in the morning. Centrum Gummies   OVER THE COUNTER MEDICATION    Kraton   TADALAFIL  (CIALIS ) 20 MG TABLET    Take 1 tablet (20 mg total) by mouth daily as needed for erectile dysfunction.  Modified Medications   No medications on file  Discontinued Medications   No medications on file      Past Medical History:  Diagnosis Date   Allergy    Arthritis    Depression    situational (mother passing)   DVT (deep venous thrombosis) (HCC) 2010   GERD (gastroesophageal reflux disease)    not at this time   Hx of pulmonary embolus 2014   Impingement syndrome of left ankle    PONV (postoperative nausea and vomiting)    Smoker 12/02/2007   Tobacco use disorder      Social History   Tobacco Use   Smoking status: Some Days    Current packs/day: 0.20    Average packs/day: 0.2 packs/day for 28.0 years (5.6 ttl pk-yrs)    Types: Cigarettes   Smokeless tobacco: Former  Advertising account planner   Vaping status: Former  Substance Use Topics   Alcohol use: Yes    Alcohol/week: 2.0 standard drinks of alcohol    Types: 2 Standard drinks or equivalent per week    Comment: 2 times a year   Drug use: No    Family History  Problem Relation Age of Onset   Hypertension Mother    COPD Mother    Aortic dissection Mother 85   Hypertension Father    Cancer Father     Allergies  Allergen Reactions   Penicillins Other (See Comments)    From childhood Did it involve swelling of the face/tongue/throat, SOB, or low BP? Yes Did it involve sudden or severe rash/hives, skin peeling, or any reaction on the inside of your mouth or nose? Unknown Did you need to seek medical attention at a hospital or doctor's office? Unknown When did it last happen? childhood reaction. If all above answers are NO, may proceed with cephalosporin use.     Review of Systems  All other systems reviewed and are negative.  Except as noted above.  OBJECTIVE:    Vitals:   06/17/24 1603  BP: 120/72  Pulse: 82  Resp: 18  Temp: 98 F (36.7 C)  TempSrc: Temporal  SpO2: 100%  Weight: 130 lb (59 kg)  Height: 5' 10 (1.778 m)     Body mass index is 18.65 kg/m.  Physical Exam Constitutional:      Appearance: Normal appearance.  HENT:     Head: Normocephalic and atraumatic.  Eyes:     Extraocular Movements: Extraocular movements intact.     Conjunctiva/sclera: Conjunctivae normal.  Abdominal:     General: There is no distension.     Palpations: Abdomen is soft.  Musculoskeletal:     Cervical back: Normal range of motion and neck supple.  Skin:    General: Skin is warm and dry.     Coloration: Skin is not jaundiced.  Neurological:     General: No focal deficit present.      Mental Status: He is alert and oriented to person, place, and time.  Psychiatric:        Mood and Affect: Mood normal.        Behavior: Behavior normal.      Labs and Microbiology:    Latest Ref Rng & Units 10/09/2023    4:42 PM 07/10/2023  4:20 PM 03/24/2023   10:17 AM  CBC  WBC 3.8 - 10.8 Thousand/uL 6.0  7.1  6.0   Hemoglobin 13.2 - 17.1 g/dL 84.4  86.3  86.1   Hematocrit 38.5 - 50.0 % 45.6  40.6  40.6   Platelets 140 - 400 Thousand/uL 233  229  214       Latest Ref Rng & Units 05/19/2024    9:13 AM 10/09/2023    4:42 PM 07/10/2023    4:20 PM  CMP  Glucose 70 - 99 mg/dL 82  82  78   BUN 6 - 24 mg/dL 9  8  11    Creatinine 0.76 - 1.27 mg/dL 9.24  9.36  9.34   Sodium 134 - 144 mmol/L 142  140  142   Potassium 3.5 - 5.2 mmol/L 4.0  3.7  3.8   Chloride 96 - 106 mmol/L 102  104  104   CO2 20 - 32 mmol/L  29  30   Calcium  8.7 - 10.2 mg/dL 8.8  8.7  8.0   Total Protein 6.0 - 8.5 g/dL 6.3  6.3  6.1   Total Bilirubin 0.0 - 1.2 mg/dL 0.4  0.4  0.5   Alkaline Phos 47 - 123 IU/L 46     AST 0 - 40 IU/L 18  18  17    ALT 9 - 46 U/L  13  12      Micro: 03/2024 3 afb sputum cx negative and smear negative 08/21/23 AFB sputum smear positive but culture negative 08/19/23 and 08/20/23 afb sputum cx negative 12/2022 afb sputum cx negative 09/2022 afb sputum cx negative 04/2022 sputum afb mac (S amikacin , clarithro; clofaz mic 0.12) 01/2022 afb sputum mac 05/2021 BAL afb cx mac  Imaging: Reviewed  08/2023 chest ct Dominant 5.3 cm right apical masslike opacity, similar in overall size but now with central cavitation, compatible with the patient's known atypical mycobacterial infection.   Additional peribronchovascular nodular opacities in the bilateral upper lobes, similar. Progressive nodularity/patchy opacity in the inferior right upper lobe and posterior right middle lobe. This appearance remains compatible with waxing/waning atypical mycobacterial infection.   Consider  follow-up CT chest in 1 year.    05/2024 chest ct Interval decrease in size of the cavitary component the right apical cavitary masslike consolidation consistent with known mycobacterial infection.   Diminished consolidation and ground-glass attenuation of the right middle lobe infectious/inflammatory process.   Remainder of the left apical nodule and multifocal chronic bronchocentric clustered pulmonary nodules are grossly stable with interval resolution of ground-glass component of anterior right upper lobe consistent with resolution of active infection/inflammation.   Recommend follow-up to ensure stability.    ASSESSMENT & PLAN:    No problem-specific Assessment & Plan notes found for this encounter.    No orders of the defined types were placed in this encounter.    Nodulocavitary mac lung Smoker Copd    Abx (discussed with our pharmacist Cassie) 09/2021-c clofazimine  08/2021-c ethambutol  08/2021-c azithromycin  08/2021-c arikayce     08/2021-10/2021 moxifloxacin  08/2021-06/2022 amikacin   Started on azith, moxi, ethambutol , and inhaled amikacin  to be aggressive. susceptibilities came back and it was R to moxi, so stopped that and substituted clofaz. had continued sputum production and + cultures despite 6 months of this treatment so changed to IV amikacin . He saw Florian at [facility] and he said MIC was too high for IV amikacin  to get benefit so advised to switch back to inhaled amikacin  and this is where we are now.  Rifampin not used initially due to ddi with his partial opioid agonist     Sputums 3 sets Blood tests Stop smoking See me in 3 months   ---------- 07/10/2023 id assessment Still smokes -- advise stopping smoking No sx change since on MAC treatment (2 miles biking; walk flat ground 2 miles [place] trouble); no b symptoms. Good appetite No abx side effect Yet to give sputum Due for repeat chest ct   Discussed he has been on mac  tx for 2 years and no change in symptoms. We discussed potentially if ct stable/improved and despite sputum inability to clear we might be able to stop antibiotics because utlimately goals is quality of life and also because high risk of mac recolonization/reinfection   F/u 3 months Labs today Chest ct repeat   10/09/23 id clinic assessment Patient felt clinically same as last visit He hasn't seen pulmonology for follow up --- baseline doesn't get short of breath unless he walks 4 flight of stairs and he does all heavy chores ok Still smokes  Ct from 08/2023 reviewed for most part stable but rul apical lesion cavitary now -- will defer to pulm if feels need to biopsy to look for cancer given smoking hx (no b sx)  Minimal intermittent cough; no weight loss; appetite is good  08/21/23 sputum still growing afb  Given cavitary change and ongoing positive sample sputum, will consider adding omadocycline to regimen   Will need to get susceptibility  3 more samples sputum ordered  Cbc, cmp  F/u 8 weeks   11/19/23 id clinic assessment I discussed with our pharmacy team. There seems to be some confusion from patient in terms of taking all the medications for his ntm I discuss need to take clofaz, azith, ethambutol , and arikayce   Dr Kassie Chi called me today and relayed that his pft has been stable. She does want to repeat chest ct in 6 months. We discussed that if his ct is worse and the ntm is suppressed, will likely needs to look for other organism causing lung disease  He doesn't feel any different in terms of respiratory sx. No fever, chill, worsening cough, worsening dyspnea  His 08/2023 cultures showed afb but no growth on culture   F/u 3 months and will get more culture  --------------- 02/17/24 id clinic assessment He has had negative sputum culture since 09/2022 on 3 different occasions. Last sputum 08/21/23 afb smear positive but cultures negative  Patient has standing  orders for 3 set of afb sputum to be done first thing in the morning   No cough No fever, chill, nightsweat No worsening dyspnea since last visit No visual decrement  Still smoke   -advise patient to schedule eye exam -repeat 2-3 set of afb sputum cultures again -if negative cultures could consider proposal to stop mac treatment although relapse rate is high -chest ct within the next 2-3 months -follow after chest ct so 3-4 months (make sure sputum is done at least 6 weeks before follow up) -chart forwarded to dr Chi    06/17/24 id clinic assessment Still smoking Cultures negative Recent chest ct some improvement in cavitary size Normally no cough and good functional status  We had 7 months culture sterility  Will repeat sputum cx 08/2024 and if this is negative will stop antibiotics and monitor   Patient to send mychart message around end of December 2025 so I can order sputum cx   Follow up 6-8 weeks after  the last sputum submission (10/2024)    Constance ONEIDA [facility] 06/17/2024, 4:07 PM

## 2024-06-30 ENCOUNTER — Telehealth: Payer: Self-pay | Admitting: Pharmacist

## 2024-06-30 NOTE — Telephone Encounter (Signed)
 2 bottles of clofazimine  are located in the pharmacy office when patient needs a refill.  Skyylar Kopf L. Mohammed Mcandrew, PharmD, BCIDP, AAHIVP, CPP Infectious Diseases Clinical Pharmacist Practitioner Clinical Pharmacist Lead, [facility]

## 2024-07-05 ENCOUNTER — Encounter: Payer: Self-pay | Admitting: Pain Medicine

## 2024-07-05 ENCOUNTER — Ambulatory Visit: Attending: Pain Medicine | Admitting: Pain Medicine

## 2024-07-05 VITALS — BP 116/77 | HR 75 | Temp 97.3°F | Resp 14 | Ht 70.0 in | Wt 130.0 lb

## 2024-07-05 DIAGNOSIS — G8929 Other chronic pain: Secondary | ICD-10-CM | POA: Insufficient documentation

## 2024-07-05 DIAGNOSIS — Z981 Arthrodesis status: Secondary | ICD-10-CM | POA: Insufficient documentation

## 2024-07-05 DIAGNOSIS — M79605 Pain in left leg: Secondary | ICD-10-CM | POA: Insufficient documentation

## 2024-07-05 DIAGNOSIS — M25572 Pain in left ankle and joints of left foot: Secondary | ICD-10-CM | POA: Insufficient documentation

## 2024-07-05 DIAGNOSIS — F1990 Other psychoactive substance use, unspecified, uncomplicated: Secondary | ICD-10-CM | POA: Insufficient documentation

## 2024-07-05 NOTE — Progress Notes (Signed)
 Safety precautions to be maintained throughout the outpatient stay will include: orient to surroundings, keep bed in low position, maintain call bell within reach at all times, provide assistance with transfer out of bed and ambulation.

## 2024-07-05 NOTE — Progress Notes (Signed)
 PROVIDER NOTE: Interpretation of information contained herein should be left to medically-trained personnel. Specific patient instructions are provided elsewhere under Patient Instructions section of medical record. This document was created in part using AI and STT-dictation technology, any transcriptional errors that may result from this process are unintentional.  Patient: Gerald Fernandez  Service: E/M   PCP: Joyce Norleen BROCKS, MD  DOB: 1965-10-14  DOS: 07/05/2024  Provider: Eric DELENA Como, MD  MRN: [id number]  Delivery: Face-to-face  Specialty: Interventional Pain Management  Type: Established Patient  Setting: Ambulatory outpatient facility  Specialty designation: 09  Referring Prov.: Joyce Norleen BROCKS, MD  Location: Outpatient office facility       Primary Reason(s) for Visit: Encounter for evaluation before starting new chronic pain management plan of care (Level of risk: moderate) CC: Ankle Pain (Left), Leg Pain (Left), Knee Pain (Left), Hip Pain (Left), and Back Pain (Left )  HPI  [name] is a 58 y.o. year old, male patient, who comes today for a follow-up evaluation to review the test results and decide on a treatment plan. 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 His primarily concern today is the Ankle Pain (Left), Leg Pain (Left), Knee Pain  (Left), Hip Pain (Left), and Back Pain (Left )  Pain Assessment: Location: Left Ankle Radiating: radiates up left shin to knee then to left hip to lower back Onset: More than a month ago Duration: Chronic pain Quality: Aching, Constant, Dull, Burning, Shooting Severity: 6 /10 (subjective, self-reported pain score)  Effect on ADL: Limits ADLs Timing: Constant Modifying factors: Denies BP: 116/77  HR: 75  Mr. Cuffee comes in today for a follow-up visit after his initial evaluation on 05/19/2024. Today we went over the results of his tests. These were explained in Layman's terms. During today's appointment we went over my diagnostic impression, as well as the proposed treatment plan.  Review of initial evaluation (05/19/2024): [name] is a 58 year old male who presents with chronic left ankle pain and substance use concerns. He is accompanied by his wife, Jon.   He sustained a left ankle injury over ten years ago, leading to a 'clean out' procedure and a subsequent fusion surgery. He believes the fusion did not fully integrate, and he later fractured his talus bone. He experiences pain in his left heel, ankle, and radiating pain up to his shin, knee, and hip. He rarely experiences back pain.   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 He consumes 40 to 45 Kratom pills daily, taking 8 to 12 pills at a time, four times a day. He experiences withdrawal symptoms, including flu-like symptoms, shaking, and unease when attempting to stop Kratom.   He has had two surgeries on his left ankle, the last in 2021, with no postoperative infections. He has not undergone physical therapy or nerve conduction studies. He receives cortisone  injections in the ankle joint, two to three times per year. He has not had recent imaging studies or interventional therapies such as nerve blocks or implants for pain management.  Review  of diagnostic test ordered on 05/19/2024:  Diagnostic lab work: Lab work was within normal limits for vitamin D  levels, C-reactive protein, sed rate, magnesium , and vitamin B12 levels.  Comprehensive metabolic panel showed a very mildly decreased serum creatinine level of 0.75 mg/dL (normal 9.23-8.72) with a very mildly decreased alkaline phosphatase of 46 international units/L (normal 47-123). Diagnostic imaging: Diagnostic x-rays of the left ankle showed no acute osseous abnormality.  Prior tibial talar arthrodesis with stable appearance of the hardware.  Slight progressive degenerative changes at the distal tibiofibular articulation.  Diagnostic x-rays of the left knee show mild degenerative changes with chondrocalcinosis.  Mild medial and patellofemoral degenerative changes.  Probable tiny knee effusion.  Diagnostic x-rays of the lumbar spine with bending views showed multilevel degenerative changes, most advanced at the L5-S1 level.  There is aortic atherosclerosis.  Trace retrolisthesis of L2 over L3, L3 over L4, and L4 over L5.  Mild disc space narrowing at L1-2, L2-3, and moderate to space narrowing at L4-5 with advanced disc space narrowing at L5-S1.  Mild lower lumbar facet degenerative changes.  Diagnostic x-rays of the left hip were not done.  Discussed the use of AI scribe software for clinical note transcription with the patient, who gave verbal consent to proceed.  History of Present Illness   [name] is a 58 year old male with osteoarthritis and degenerative disc disease who presents with joint pain management.  He experiences discomfort in the left knee with mild degenerative changes and cartilage loss confirmed by previous x-rays. Lumbar spine pain is present with decreased disc height at L1-2, L2-3, L4-5, and L5-S1, with more severe degeneration in the lower discs. Previous x-rays showed facet joint changes and trace retrolysis at L2-3, L3-4, and L4-5 without instability.  He has  a history of left ankle fusion with stable hardware on recent imaging. There is slight progression of degeneration in the left ankle, with pain primarily in the ankle and some heel pain radiating to the front of the shin.  He is tapering off creatine use, previously taking 8-12 doses several times a day, now reduced to 6 per day. He attends a methadone clinic and is on buprenorphine to manage cravings and pain, which he finds effective.      Patient presented with interventional treatment options. Mr. Olmo was informed that I will not be providing medication management. Pharmacotherapy evaluation including recommendations may be offered, if specifically requested.   Controlled Substance Pharmacotherapy Assessment REMS (Risk Evaluation and Mitigation Strategy)  Opioid Analgesic: None MME/day: 0 mg/day   Pill Count: None expected due to no prior prescriptions written by our practice. Bonner Norris, RN  07/05/2024  2:48 PM  Sign when Signing Visit Safety precautions to be maintained throughout the outpatient stay will include: orient to surroundings, keep bed in low position, maintain call bell within reach at all times, provide assistance with transfer out of bed and ambulation.     Pharmacokinetics: Liberation and absorption (onset of action): WNL Distribution (time to peak effect): WNL Metabolism and excretion (duration of action): WNL         Pharmacodynamics: Desired effects: Analgesia: Mr. Toothman reports >50% benefit. Functional ability: Patient reports that medication allows him to accomplish basic ADLs Clinically meaningful improvement in function (CMIF): Sustained CMIF goals met Perceived effectiveness:  Described as relatively effective, allowing for increase in activities of daily living (ADL) Undesirable effects: Side-effects or Adverse reactions: None reported Monitoring: [place] PMP: PDMP reviewed during this encounter. Online review of the past 77-month period previously conducted.  Not applicable at this point since we have not taken over the patient's medication management yet. List of other Serum/Urine Drug Screening Test(s):  No results found for: AMPHSCRSER, BARBSCRSER, BENZOSCRSER, COCAINSCRSER, COCAINSCRNUR, PCPSCRSER, THCSCRSER, THCU, CANNABQUANT, OPIATESCRSER, OXYSCRSER, PROPOXSCRSER, ETH, CBDTHCR, D8THCCBX, D9THCCBX List of all UDS test(s) done:  Lab Results  Component Value Date   SUMMARY FINAL 05/19/2024   Last UDS on record: Summary  Date Value Ref Range Status  05/19/2024 FINAL  Final    Comment:    ==================================================================== Compliance Drug Analysis, Ur ==================================================================== Test                             Result       Flag       Units  Drug Present not Declared for Prescription Verification   Acetaminophen                   PRESENT      UNEXPECTED   Doxylamine                     PRESENT      UNEXPECTED   Dextrorphan/Levorphanol        PRESENT      UNEXPECTED    Dextrorphan is an expected metabolite of dextromethorphan, an over-    the-counter or prescription cough suppressant. Levorphanol is a    scheduled prescription medication. Dextrorphan cannot be    distinguished from levorphanol by the method used for analysis.  Drug Absent but Declared for Prescription Verification   Ibuprofen                       Not Detected UNEXPECTED    Ibuprofen , as indicated in the declared medication list, is not    always detected even when used as directed.  ==================================================================== Test                      Result    Flag   Units      Ref Range   Creatinine              53               mg/dL      >=79 ==================================================================== Declared Medications:  The flagging and interpretation on this report are based on the  following declared medications.   Unexpected results may arise from  inaccuracies in the declared medications.   **Note: The testing scope of this panel does not include small to  moderate amounts of these reported medications:   Ibuprofen  (Advil )   **Note: The testing scope of this panel does not include the  following reported medications:   Albuterol  (Ventolin  HFA)  Amikacin  (Arikayce )  Azithromycin  (Zithromax )  Clofazimine   Ethambutol  (Myambutol )  Fluticasone (Advair )  Kratom  Multivitamin  Salmeterol (Advair )  Tadalafil  (Cialis ) ==================================================================== For clinical consultation, please call [phone]. ====================================================================    UDS interpretation: No unexpected findings.          Medication Assessment Form: Not applicable. No opioids. Treatment compliance: Not applicable Risk Assessment Profile: Aberrant behavior: See initial evaluations. None observed or detected today Comorbid factors increasing risk of overdose: See initial evaluation. No  additional risks detected today Opioid risk tool (ORT):     05/19/2024    8:13 AM  Opioid Risk   Alcohol 3  Illegal Drugs 0  Rx Drugs 0  Alcohol 0  Illegal Drugs 0  Rx Drugs 5  Age between 16-45 years  0  Psychological Disease 0  Depression 0  Opioid Risk Tool Scoring 8  Opioid Risk Interpretation High Risk    ORT Scoring interpretation table:  Score <3 = Low Risk for SUD  Score between 4-7 = Moderate Risk for SUD  Score >8 = High Risk for Opioid Abuse   Risk of substance use disorder (SUD): Low  Risk Mitigation Strategies:  Patient opioid safety counseling: No controlled substances prescribed. Patient-Prescriber Agreement (PPA): No agreement signed.  Controlled substance notification to other providers: None required. No opioid therapy.  Pharmacologic Plan: Non-opioid analgesic therapy offered. Interventional alternatives discussed.              Laboratory Chemistry Profile   Renal Lab Results  Component Value Date   BUN 9 05/19/2024   CREATININE 0.75 (L) 05/19/2024   BCR 12 05/19/2024   GFRAA >60 02/24/2019   GFRNONAA >60 06/09/2021     Electrolytes Lab Results  Component Value Date   NA 142 05/19/2024   K 4.0 05/19/2024   CL 102 05/19/2024   CALCIUM  8.8 05/19/2024   MG 1.6 05/19/2024     Hepatic Lab Results  Component Value Date   AST 18 05/19/2024   ALT 13 10/09/2023   ALBUMIN 4.0 05/19/2024   ALKPHOS 46 (L) 05/19/2024   LIPASE 14 04/05/2013     ID Lab Results  Component Value Date   HIV NON-REACTIVE 07/05/2021   SARSCOV2NAA POSITIVE (A) 06/06/2021   STAPHAUREUS NEGATIVE 02/24/2019   MRSAPCR NEGATIVE 02/24/2019     Bone Lab Results  Component Value Date   25OHVITD1 36 05/19/2024   25OHVITD2 <1.0 05/19/2024   25OHVITD3 35 05/19/2024   TESTOSTERONE  343 02/05/2023     Endocrine Lab Results  Component Value Date   GLUCOSE 82 05/19/2024   TESTOSTERONE  343 02/05/2023     Neuropathy Lab Results  Component Value Date   VITAMINB12 1,234 05/19/2024   HIV NON-REACTIVE 07/05/2021     CNS No results found for: COLORCSF, APPEARCSF, RBCCOUNTCSF, WBCCSF, POLYSCSF, LYMPHSCSF, EOSCSF, PROTEINCSF, GLUCCSF, JCVIRUS, CSFOLI, IGGCSF, LABACHR, ACETBL   Inflammation (CRP: Acute  ESR: Chronic) Lab Results  Component Value Date   CRP 7 05/19/2024   ESRSEDRATE 9 05/19/2024     Rheumatology Lab Results  Component Value Date   RF <14 07/05/2021   ANA NEGATIVE 07/05/2021     Coagulation Lab Results  Component Value Date   PLT 233 10/09/2023   DDIMER 1.03 (H) 04/04/2013     Cardiovascular Lab Results  Component Value Date   HGB 15.5 10/09/2023   HCT 45.6 10/09/2023     Screening Lab Results  Component Value Date   SARSCOV2NAA POSITIVE (A) 06/06/2021   STAPHAUREUS NEGATIVE 02/24/2019   MRSAPCR NEGATIVE 02/24/2019   HIV NON-REACTIVE 07/05/2021     Cancer No  results found for: CEA, CA125, LABCA2   Allergens No results found for: ALMOND, APPLE, ASPARAGUS, AVOCADO, BANANA, BARLEY, BASIL, BAYLEAF, GREENBEAN, LIMABEAN, WHITEBEAN, BEEFIGE, REDBEET, BLUEBERRY, BROCCOLI, CABBAGE, MELON, CARROT, CASEIN, CASHEWNUT, CAULIFLOWER, CELERY     Note: Lab results reviewed.  Recent Diagnostic Imaging Review  Lumbosacral Imaging: Lumbar DG Bending views: Results for orders placed during the hospital encounter of 05/19/24 DG Lumbar Spine  Complete W/Bend  Narrative CLINICAL DATA:  Low back pain  EXAM: LUMBAR SPINE - COMPLETE WITH BENDING VIEWS  COMPARISON:  None Available.  FINDINGS: Aortic atherosclerosis. Trace retrolisthesis L2 on L3, L3 on L4 and L4 on L5. Vertebral body heights are maintained. Mild disc space narrowing at L1-L2, L2-L3, moderate disc space narrowing at L4-L5 and advanced disc space narrowing at L5-S1. No suspicious change in alignment with flexion or extension. Mild lower lumbar facet degenerative changes  IMPRESSION: Multilevel degenerative changes, most advanced at L5-S1.   Electronically Signed By: Luke Bun M.D. On: 05/23/2024 21:38  Knee Imaging: Knee-L DG 4 views: Results for orders placed during the hospital encounter of 05/19/24 DG Knee Complete 4 Views Left  Narrative CLINICAL DATA:  Left-sided knee pain  EXAM: LEFT KNEE - COMPLETE 4+ VIEW  COMPARISON:  None Available.  FINDINGS: No fracture or malalignment. Joint space calcifications consistent with chondrocalcinosis. Mild medial and patellofemoral degenerative changes. Probable tiny knee effusion  IMPRESSION: Mild degenerative changes with chondrocalcinosis.   Electronically Signed By: Luke Bun M.D. On: 05/23/2024 21:39  Ankle Imaging: Ankle-L DG Complete: Results for orders placed during the hospital encounter of 05/19/24 DG Ankle Complete Left  Narrative CLINICAL DATA:  Ankle  pain  EXAM: LEFT ANKLE COMPLETE - 3+ VIEW  COMPARISON:  CT 01/14/2020, 06/17/2019  FINDINGS: No acute fracture is seen. Prior tibiotalar arthrodesis with similar appearance of anterior fixating plate and multiple screws. Similar mild lucency around the fixating screws in the anterior talus on lateral view. Moderate plantar calcaneal spur and mild plantar calcifications. Slight progressive degenerative changes at the distal tibiofibular articulation.  IMPRESSION: 1. No acute osseous abnormality. 2. Prior tibiotalar arthrodesis with stable appearance of the hardware. 3. Slight progressive degenerative changes at the distal tibiofibular articulation.   Electronically Signed By: Luke Bun M.D. On: 05/23/2024 21:36  Complexity Note: Imaging results reviewed.                         Meds   Current Outpatient Medications:    ADVAIR  HFA 115-21 MCG/ACT inhaler, INHALE 2 PUFFS INTO THE LUNGS TWICE A DAY, Disp: 12 each, Rfl: 3   albuterol  (VENTOLIN  HFA) 108 (90 Base) MCG/ACT inhaler, INHALE 2 PUFFS INTO THE LUNGS EVERY 6 HOURS AS NEEDED FOR WHEEZING OR SHORTNESS OF BREATH, Disp: 6.7 g, Rfl: 5   azithromycin  (ZITHROMAX ) 500 MG tablet, Take 1 tablet (500 mg total) by mouth daily., Disp: 30 tablet, Rfl: 11   clofazimine  50 mg CAPS capsule (for compassionate use), Take 100 mg by mouth daily with breakfast., Disp: , Rfl:    ethambutol  (MYAMBUTOL ) 400 MG tablet, Take 2.5 tablets (1,000 mg total) by mouth daily., Disp: 75 tablet, Rfl: 11   ibuprofen  (ADVIL ) 800 MG tablet, Take 800 mg by mouth in the morning and at bedtime., Disp: , Rfl:    Multiple Vitamin (MULTIVITAMIN WITH MINERALS) TABS tablet, Take 1 tablet by mouth in the morning. Centrum Gummies, Disp: , Rfl:    OVER THE COUNTER MEDICATION, Kraton, Disp: , Rfl:    tadalafil  (CIALIS ) 20 MG tablet, Take 1 tablet (20 mg total) by mouth daily as needed for erectile dysfunction., Disp: 30 tablet, Rfl: 5   Amikacin  Sulfate Liposome  (ARIKAYCE ) 590 MG/8.4ML SUSP, Inhale 590 mg into the lungs daily. (Patient not taking: Reported on 07/05/2024), Disp: 252 mL, Rfl: 11  ROS  Constitutional: Denies any fever or chills Gastrointestinal: No reported hemesis, hematochezia, vomiting, or acute GI  distress Musculoskeletal: Denies any acute onset joint swelling, redness, loss of ROM, or weakness Neurological: No reported episodes of acute onset apraxia, aphasia, dysarthria, agnosia, amnesia, paralysis, loss of coordination, or loss of consciousness  Allergies  Mr. Soulliere is allergic to penicillins.  PFSH  Drug: Mr. Bille  reports no history of drug use. Alcohol:  reports current alcohol use of about 2.0 standard drinks of alcohol per week. Tobacco:  reports that he has been smoking cigarettes. He has a 5.6 pack-year smoking history. He has quit using smokeless tobacco. Medical:  has a past medical history of Allergy, Arthritis, Depression, DVT (deep venous thrombosis) (HCC) (2010), GERD (gastroesophageal reflux disease), pulmonary embolus (2014), Impingement syndrome of left ankle, PONV (postoperative nausea and vomiting), Smoker (12/02/2007), and Tobacco use disorder. Surgical: Mr. Mihalik  has a past surgical history that includes Hand surgery (Left); Ankle arthroscopy (Left); Ankle fusion (Left, 02/24/2019); Bronchial biopsy (07/31/2021); Bronchial washings (07/31/2021); and Bronchial brushings (07/31/2021). Family: family history includes Aortic dissection (age of onset: 33) in his mother; COPD in his mother; Cancer in his father; Hypertension in his father and mother.  Constitutional Exam  General appearance: Well nourished, well developed, and well hydrated. In no apparent acute distress Vitals:   07/05/24 1450  BP: 116/77  Pulse: 75  Resp: 14  Temp: (!) 97.3 F (36.3 C)  TempSrc: Temporal  SpO2: 99%  Weight: 130 lb (59 kg)  Height: 5' 10 (1.778 m)   BMI Assessment: Estimated body mass index is 18.65 kg/m as calculated from  the following:   Height as of this encounter: 5' 10 (1.778 m).   Weight as of this encounter: 130 lb (59 kg).  BMI interpretation table: BMI level Category Range association with higher incidence of chronic pain  <18 kg/m2 Underweight   18.5-24.9 kg/m2 Ideal body weight   25-29.9 kg/m2 Overweight Increased incidence by 20%  30-34.9 kg/m2 Obese (Class I) Increased incidence by 68%  35-39.9 kg/m2 Severe obesity (Class II) Increased incidence by 136%  >40 kg/m2 Extreme obesity (Class III) Increased incidence by 254%   Patient's current BMI Ideal Body weight  Body mass index is 18.65 kg/m. Ideal body weight: 73 kg (160 lb 15 oz)   BMI Readings from Last 4 Encounters:  07/05/24 18.65 kg/m  06/17/24 18.65 kg/m  05/19/24 18.65 kg/m  04/07/24 20.29 kg/m   Wt Readings from Last 4 Encounters:  07/05/24 130 lb (59 kg)  06/17/24 130 lb (59 kg)  05/19/24 130 lb (59 kg)  04/07/24 137 lb 6.4 oz (62.3 kg)    Psych/Mental status: Alert, oriented x 3 (person, place, & time)       Eyes: PERLA Respiratory: No evidence of acute respiratory distress  Assessment & Plan  Primary Diagnosis & Pertinent Problem List: The primary encounter diagnosis was Chronic ankle pain (1ry area of Pain) (Left). Diagnoses of Chronic lower extremity pain (Left), History of ankle fusion (Left), and Substance use disorder were also pertinent to this visit. Visit Diagnosis: 1. Chronic ankle pain (1ry area of Pain) (Left)   2. Chronic lower extremity pain (Left)   3. History of ankle fusion (Left)   4. Substance use disorder    Problems updated and reviewed during this visit: No problems updated.  Plan of Care  Assessment and Plan    Chronic left ankle pain and arthrodesis status   Chronic left ankle pain post-arthrodesis shows slight degeneration progression, expected post-trauma. Pain primarily in the ankle radiates to the shin, likely due to nerve  involvement. Consider a diagnostic nerve block to assess  pain relief. If effective, a TENS unit or peripheral nerve implant will be considered for long-term management.  Degenerative disc disease of lumbar spine with associated back pain   Degenerative disc disease presents with decreased disc height at L1-2, L2-3, L4-5, and L5-S1, more severe at L5-S1. Trace retrolisthesis at L2-3, L3-4, and L4-5 without instability. Pain likely originates from facet joints. Consider facet joint injections with numbing medicine and steroid for pain relief. If pain recurs, radiofrequency ablation will be considered for longer-lasting relief.  Osteoarthritis of left knee   Mild degenerative changes in the left knee with cartilage loss. No significant findings on lab work. Continue current management as changes are mild.  Atypical mycobacterial lung infection with cavitation   Atypical mycobacterial infection with lung cavitations. Negative lab results for mycobacterium. Smoking cessation is crucial due to potential immune suppression and risk of infection. Emphasized smoking cessation to reduce infection risk.  Nicotine  dependence   Continued smoking despite known risks, including potential exacerbation of lung infection and immune suppression. Encouraged smoking cessation to improve overall health and reduce infection risk.  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 Buprenorphine therapy is effective in managing pain and cravings. Counseling is ongoing at the methadone clinic. Buprenorphine is an agonist-antagonist with a lower risk of respiratory depression compared to other narcotics. Continue buprenorphine therapy and counseling. Continue tapering creatine use weekly until cessation.        Pharmacotherapy (Medications Ordered): No orders of the defined types were placed in this encounter.  Procedure Orders    No procedure(s) ordered today   No orders of the defined types were  placed in this encounter.  Lab Orders  No laboratory test(s) ordered today   Imaging Orders  No imaging studies ordered today   Referral Orders  No referral(s) requested today    Pharmacological management:  Opioid Analgesics: I will not be prescribing any opioids at this time Membrane stabilizer: I will not be prescribing any at this time Muscle relaxant: I will not be prescribing any at this time NSAID: I will not be prescribing any at this time Other analgesic(s): I will not be prescribing any at this time      Interventional Therapies  Risk Factors  Considerations  Medical Comorbidities:  Smoker  cavitary lesion of lung  Hx. DVT  Hx. PE  GERD  SUD  Kraton Abuse/Addiction     Planned  Pending:      Under consideration:   Pending   Completed: (Analgesic benefit)1  None at this time   Therapeutic  Palliative (PRN) options:   None established   Completed by other providers:   None reported  1(Analgesic benefit): Expressed in percentage (%). (Local anesthetic[LA] +/- sedation  L.A.Local Anesthetic  Steroid benefit  Ongoing benefit)      Provider-requested follow-up: Return if symptoms worsen or fail to improve. Recent Visits Date Type Provider Dept  05/19/24 Office Visit Tanya Glisson, MD [facility]  Showing recent visits within past 90 days and meeting all other requirements Today's Visits Date Type Provider Dept  07/05/24 Office Visit Tanya Glisson, MD [facility]  Showing today's visits and meeting all other requirements Future Appointments No visits were found meeting these conditions. Showing future appointments within next 90 days and meeting all other requirements   Primary Care Physician: Joyce Norleen BROCKS, MD  Duration of encounter: 37 minutes.  Total time on encounter, as per AMA guidelines included both the face-to-face and non-face-to-face time personally spent by the physician and/or other qualified  health care professional(s) on the day of the encounter (includes time in activities that require the physician or other qualified health care professional and does not include time in activities normally performed by clinical staff).   Note by: Eric DELENA Como, MD (TTS technology used. I apologize for any typographical errors that were not detected and corrected.) Date: 07/05/2024; Time: 3:41 PM

## 2024-10-05 ENCOUNTER — Encounter: Payer: Self-pay | Admitting: Internal Medicine

## 2024-10-05 ENCOUNTER — Ambulatory Visit: Admitting: Internal Medicine

## 2024-10-05 ENCOUNTER — Ambulatory Visit: Payer: Self-pay

## 2024-10-05 ENCOUNTER — Other Ambulatory Visit: Payer: Self-pay

## 2024-10-05 ENCOUNTER — Emergency Department (HOSPITAL_BASED_OUTPATIENT_CLINIC_OR_DEPARTMENT_OTHER)

## 2024-10-05 ENCOUNTER — Encounter (HOSPITAL_BASED_OUTPATIENT_CLINIC_OR_DEPARTMENT_OTHER): Payer: Self-pay | Admitting: Emergency Medicine

## 2024-10-05 ENCOUNTER — Emergency Department (HOSPITAL_BASED_OUTPATIENT_CLINIC_OR_DEPARTMENT_OTHER)
Admission: EM | Admit: 2024-10-05 | Discharge: 2024-10-05 | Disposition: A | Discharge status: Home / Self Care | Source: Ambulatory Visit | Attending: Emergency Medicine | Admitting: Emergency Medicine

## 2024-10-05 VITALS — BP 100/70 | HR 86 | Temp 98.2°F | Ht 70.0 in | Wt 130.0 lb

## 2024-10-05 DIAGNOSIS — R059 Cough, unspecified: Secondary | ICD-10-CM | POA: Insufficient documentation

## 2024-10-05 DIAGNOSIS — E871 Hypo-osmolality and hyponatremia: Secondary | ICD-10-CM | POA: Insufficient documentation

## 2024-10-05 DIAGNOSIS — J029 Acute pharyngitis, unspecified: Secondary | ICD-10-CM | POA: Diagnosis not present

## 2024-10-05 DIAGNOSIS — R590 Localized enlarged lymph nodes: Secondary | ICD-10-CM | POA: Insufficient documentation

## 2024-10-05 DIAGNOSIS — B349 Viral infection, unspecified: Secondary | ICD-10-CM

## 2024-10-05 DIAGNOSIS — R509 Fever, unspecified: Secondary | ICD-10-CM | POA: Diagnosis not present

## 2024-10-05 DIAGNOSIS — F17200 Nicotine dependence, unspecified, uncomplicated: Secondary | ICD-10-CM | POA: Diagnosis not present

## 2024-10-05 DIAGNOSIS — E876 Hypokalemia: Secondary | ICD-10-CM | POA: Insufficient documentation

## 2024-10-05 DIAGNOSIS — A310 Pulmonary mycobacterial infection: Secondary | ICD-10-CM

## 2024-10-05 DIAGNOSIS — J111 Influenza due to unidentified influenza virus with other respiratory manifestations: Secondary | ICD-10-CM

## 2024-10-05 LAB — POC COVID19/FLU A&B COMBO
Covid Antigen, POC: NEGATIVE
Influenza A Antigen, POC: NEGATIVE
Influenza B Antigen, POC: NEGATIVE

## 2024-10-05 LAB — POCT RAPID STREP A (OFFICE): Rapid Strep A Screen: NEGATIVE

## 2024-10-05 LAB — COMPREHENSIVE METABOLIC PANEL WITH GFR
ALT: 14 U/L (ref 0–44)
AST: 20 U/L (ref 15–41)
Albumin: 3.7 g/dL (ref 3.5–5.0)
Alkaline Phosphatase: 38 U/L (ref 38–126)
Anion gap: 10 (ref 5–15)
BUN: 13 mg/dL (ref 6–20)
CO2: 26 mmol/L (ref 22–32)
Calcium: 8.5 mg/dL — ABNORMAL LOW (ref 8.9–10.3)
Chloride: 98 mmol/L (ref 98–111)
Creatinine, Ser: 0.77 mg/dL (ref 0.61–1.24)
GFR, Estimated: >60 mL/min
Glucose, Bld: 124 mg/dL — ABNORMAL HIGH (ref 70–99)
Potassium: 3.6 mmol/L (ref 3.5–5.1)
Sodium: 134 mmol/L — ABNORMAL LOW (ref 135–145)
Total Bilirubin: 0.3 mg/dL (ref 0.0–1.2)
Total Protein: 6.4 g/dL — ABNORMAL LOW (ref 6.5–8.1)

## 2024-10-05 LAB — CBC
HCT: 41.8 % (ref 39.0–52.0)
Hemoglobin: 14.6 g/dL (ref 13.0–17.0)
MCH: 31.5 pg (ref 26.0–34.0)
MCHC: 34.9 g/dL (ref 30.0–36.0)
MCV: 90.3 fL (ref 80.0–100.0)
Platelets: 145 10*3/uL — ABNORMAL LOW (ref 150–400)
RBC: 4.63 MIL/uL (ref 4.22–5.81)
RDW: 12.5 % (ref 11.5–15.5)
WBC: 9.2 10*3/uL (ref 4.0–10.5)
nRBC: 0 % (ref 0.0–0.2)

## 2024-10-05 LAB — I-STAT VENOUS BLOOD GAS, ED
Acid-Base Excess: 2 mmol/L (ref 0.0–2.0)
Bicarbonate: 27.8 mmol/L (ref 20.0–28.0)
Calcium, Ion: 1.15 mmol/L (ref 1.15–1.40)
HCT: 43 % (ref 39.0–52.0)
Hemoglobin: 14.6 g/dL (ref 13.0–17.0)
O2 Saturation: 56 %
Patient temperature: 98.7
Potassium: 3.4 mmol/L — ABNORMAL LOW (ref 3.5–5.1)
Sodium: 136 mmol/L (ref 135–145)
TCO2: 29 mmol/L (ref 22–32)
pCO2, Ven: 48.7 mmHg (ref 44–60)
pH, Ven: 7.364 (ref 7.25–7.43)
pO2, Ven: 31 mmHg — CL (ref 32–45)

## 2024-10-05 LAB — LACTIC ACID, PLASMA: Lactic Acid, Venous: 1 mmol/L (ref 0.5–1.9)

## 2024-10-05 MED ORDER — ALBUTEROL SULFATE HFA 108 (90 BASE) MCG/ACT IN AERS: 2.0000 | INHALATION_SPRAY | Freq: Four times a day (QID) | RESPIRATORY_TRACT | 5 refills | Status: AC | PRN

## 2024-10-05 MED ORDER — LACTATED RINGERS IV BOLUS
500.0000 mL | Freq: Once | INTRAVENOUS | Status: AC
  Administered 2024-10-05: 500 mL via INTRAVENOUS

## 2024-10-05 MED ORDER — IOHEXOL 350 MG/ML SOLN
75.0000 mL | Freq: Once | INTRAVENOUS | Status: AC | PRN
  Administered 2024-10-05: 75 mL via INTRAVENOUS

## 2024-10-05 MED ORDER — FLUTICASONE-SALMETEROL 115-21 MCG/ACT IN AERO: 2.0000 | INHALATION_SPRAY | Freq: Two times a day (BID) | RESPIRATORY_TRACT | 3 refills | Status: AC

## 2024-10-05 NOTE — Telephone Encounter (Signed)
 FYI Only or Action Required?: FYI only for provider: appointment scheduled on 10/05/24.  Patient was last seen in primary care on 04/07/2024 by Joyce Norleen BROCKS, MD.  Called Nurse Triage reporting Fatigue and Influenza.  Symptoms began Sunday, 10/03/24.  Interventions attempted: OTC medications: Mucinex cold.  Symptoms are: gradually worsening.  Triage Disposition: See Physician Within 24 Hours  Patient/caregiver understands and will follow disposition?: Yes  Message from Norwood S sent at 10/05/2024 10:41 AM EST  Summary: Weakness   Reason for Triage: Weakness, body aches and fever; requesting an appt         Reason for Disposition  [1] Fever returns after gone for over 24 hours AND [2] symptoms worse or not improved    Fever present today - 101.1  Answer Assessment - Initial Assessment Questions 1. SYMPTOMS: What is your main symptom or concern? (e.g., cough, fever, shortness of breath, muscle aches) Body aches, weakness, cough     2. ONSET: When did the symptoms start?      Today, Sunday coughing and throat/chest burning  3. COUGH: Do you have a cough? If Yes, ask: How bad is the cough?       Slight  4. FEVER: Do you have a fever? If Yes, ask: What is your temperature, how was it measured, and when did it start?     Yes, 101.1  5. BREATHING DIFFICULTY: Are you having any difficulty breathing? (e.g., normal; shortness of breath, wheezing, unable to speak)      No  6. BETTER-SAME-WORSE: Are you getting better, staying the same or getting worse compared to yesterday?  If getting worse, ask, In what way?     Getting worse  7. OTHER SYMPTOMS: Do you have any other symptoms?  (e.g., chills, fatigue, headache, loss of smell or taste, muscle pain, sore throat)     Chest/throat burning, coughing, runny nose, slight cough  9. INFLUENZA SUSPECTED: Why do you think you have influenza? (e.g., positive flu self-test at home, symptoms after exposure).     Due to  symptoms  10. INFLUENZA VACCINE: Have you had the flu vaccine? If Yes, ask: When did you last get it?       No  Protocols used: Influenza (Flu) Suspected-A-AH

## 2024-10-05 NOTE — Patient Instructions (Addendum)
 Patient says he feels weak and disoriented. Had decreased oral intake and may be volume depleted. 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 Pulse ox is 96% on room air but looks fatigued and lethargic. Believe he needs CXR and labs stat which are not readily available here in the office today. He has on exam bilateral otitis media. Rapid strep screen, Flu, and Covid tests are negative in the office.  I believe he warrants another level of care likely needs IVF hydration, CBC and C-met plus CXR. Sending him to [facility] ED for evaluation.

## 2024-11-15 ENCOUNTER — Ambulatory Visit: Payer: Self-pay | Admitting: Internal Medicine
